# Patient Record
Sex: Male | Born: 1970 | Race: White | Hispanic: No | Marital: Married | Smoking: Former smoker
Health system: Southern US, Community
[De-identification: ages and names within clinical notes are randomized; demographics above are authoritative.]

## PROBLEM LIST (undated history)

## (undated) DIAGNOSIS — J449 Chronic obstructive pulmonary disease, unspecified: Secondary | ICD-10-CM

## (undated) DIAGNOSIS — B019 Varicella without complication: Secondary | ICD-10-CM

## (undated) DIAGNOSIS — E785 Hyperlipidemia, unspecified: Secondary | ICD-10-CM

## (undated) DIAGNOSIS — T7840XA Allergy, unspecified, initial encounter: Secondary | ICD-10-CM

## (undated) DIAGNOSIS — J45909 Unspecified asthma, uncomplicated: Secondary | ICD-10-CM

## (undated) DIAGNOSIS — G2581 Restless legs syndrome: Secondary | ICD-10-CM

## (undated) DIAGNOSIS — K219 Gastro-esophageal reflux disease without esophagitis: Secondary | ICD-10-CM

## (undated) DIAGNOSIS — Z720 Tobacco use: Secondary | ICD-10-CM

## (undated) DIAGNOSIS — IMO0001 Reserved for inherently not codable concepts without codable children: Secondary | ICD-10-CM

## (undated) DIAGNOSIS — S270XXA Traumatic pneumothorax, initial encounter: Secondary | ICD-10-CM

## (undated) HISTORY — PX: FINGER SURGERY: SHX640

## (undated) HISTORY — DX: Chronic obstructive pulmonary disease, unspecified: J44.9

## (undated) HISTORY — PX: HERNIA REPAIR: SHX51

## (undated) HISTORY — DX: Gastro-esophageal reflux disease without esophagitis: K21.9

## (undated) HISTORY — DX: Traumatic pneumothorax, initial encounter: S27.0XXA

## (undated) HISTORY — DX: Allergy, unspecified, initial encounter: T78.40XA

## (undated) HISTORY — DX: Varicella without complication: B01.9

## (undated) HISTORY — DX: Hyperlipidemia, unspecified: E78.5

## (undated) HISTORY — DX: Tobacco use: Z72.0

---

## 2003-05-12 ENCOUNTER — Other Ambulatory Visit: Payer: Self-pay

## 2006-09-16 ENCOUNTER — Emergency Department: Payer: Self-pay | Admitting: Emergency Medicine

## 2008-04-14 ENCOUNTER — Ambulatory Visit: Payer: Self-pay | Admitting: Surgery

## 2008-04-21 ENCOUNTER — Ambulatory Visit: Payer: Self-pay | Admitting: Surgery

## 2009-02-18 ENCOUNTER — Ambulatory Visit: Payer: Self-pay | Admitting: Family Medicine

## 2009-03-24 ENCOUNTER — Ambulatory Visit: Payer: Self-pay | Admitting: Internal Medicine

## 2009-07-17 ENCOUNTER — Emergency Department: Payer: Self-pay | Admitting: Emergency Medicine

## 2009-10-31 ENCOUNTER — Ambulatory Visit: Payer: Self-pay | Admitting: Internal Medicine

## 2010-01-10 ENCOUNTER — Emergency Department: Payer: Self-pay | Admitting: Emergency Medicine

## 2011-03-04 ENCOUNTER — Ambulatory Visit: Payer: Self-pay | Admitting: Internal Medicine

## 2011-05-09 ENCOUNTER — Ambulatory Visit: Payer: Self-pay | Admitting: Internal Medicine

## 2011-07-24 ENCOUNTER — Ambulatory Visit: Payer: Self-pay | Admitting: Internal Medicine

## 2013-03-24 ENCOUNTER — Ambulatory Visit: Payer: Self-pay

## 2013-05-08 ENCOUNTER — Ambulatory Visit: Payer: Self-pay | Admitting: Emergency Medicine

## 2013-08-19 ENCOUNTER — Ambulatory Visit: Payer: Self-pay | Admitting: Emergency Medicine

## 2013-08-19 ENCOUNTER — Ambulatory Visit: Payer: Self-pay | Admitting: Family Medicine

## 2013-08-31 DIAGNOSIS — M549 Dorsalgia, unspecified: Secondary | ICD-10-CM | POA: Insufficient documentation

## 2013-10-24 ENCOUNTER — Ambulatory Visit: Payer: Self-pay | Admitting: Rheumatology

## 2013-11-21 DIAGNOSIS — M5030 Other cervical disc degeneration, unspecified cervical region: Secondary | ICD-10-CM | POA: Insufficient documentation

## 2013-11-21 DIAGNOSIS — M4802 Spinal stenosis, cervical region: Secondary | ICD-10-CM | POA: Insufficient documentation

## 2013-11-25 ENCOUNTER — Encounter: Payer: Self-pay | Admitting: Physical Medicine and Rehabilitation

## 2013-12-06 ENCOUNTER — Encounter: Payer: Self-pay | Admitting: Physical Medicine and Rehabilitation

## 2014-01-06 ENCOUNTER — Encounter: Payer: Self-pay | Admitting: Physical Medicine and Rehabilitation

## 2014-02-06 ENCOUNTER — Encounter: Payer: Self-pay | Admitting: Physical Medicine and Rehabilitation

## 2014-02-10 ENCOUNTER — Ambulatory Visit: Payer: Self-pay | Admitting: Family Medicine

## 2014-04-09 ENCOUNTER — Ambulatory Visit
Admit: 2014-04-09 | Disposition: A | Discharge status: Home / Self Care | Payer: Self-pay | Attending: Internal Medicine | Admitting: Internal Medicine

## 2015-02-03 ENCOUNTER — Ambulatory Visit: Payer: 59

## 2015-02-03 ENCOUNTER — Ambulatory Visit (INDEPENDENT_AMBULATORY_CARE_PROVIDER_SITE_OTHER): Payer: 59

## 2015-02-03 ENCOUNTER — Ambulatory Visit
Admission: EM | Admit: 2015-02-03 | Discharge: 2015-02-03 | Disposition: A | Discharge status: Home / Self Care | Payer: 59 | Attending: Family Medicine | Admitting: Family Medicine

## 2015-02-03 DIAGNOSIS — R0781 Pleurodynia: Secondary | ICD-10-CM | POA: Diagnosis not present

## 2015-02-03 DIAGNOSIS — J438 Other emphysema: Secondary | ICD-10-CM

## 2015-02-03 DIAGNOSIS — R0789 Other chest pain: Secondary | ICD-10-CM

## 2015-02-03 DIAGNOSIS — R918 Other nonspecific abnormal finding of lung field: Secondary | ICD-10-CM | POA: Diagnosis not present

## 2015-02-03 HISTORY — DX: Unspecified asthma, uncomplicated: J45.909

## 2015-02-03 LAB — CBC WITH DIFFERENTIAL/PLATELET
BASOS ABS: 0 10*3/uL (ref 0–0.1)
Basophils Relative: 0 %
Eosinophils Absolute: 0.4 10*3/uL (ref 0–0.7)
Eosinophils Relative: 4 %
HEMATOCRIT: 47.8 % (ref 40.0–52.0)
Hemoglobin: 16.6 g/dL (ref 13.0–18.0)
Lymphocytes Relative: 31 %
Lymphs Abs: 2.9 10*3/uL (ref 1.0–3.6)
MCH: 31.3 pg (ref 26.0–34.0)
MCHC: 34.7 g/dL (ref 32.0–36.0)
MCV: 90.4 fL (ref 80.0–100.0)
MONO ABS: 0.8 10*3/uL (ref 0.2–1.0)
Monocytes Relative: 9 %
NEUTROS ABS: 5.3 10*3/uL (ref 1.4–6.5)
Neutrophils Relative %: 56 %
Platelets: 239 10*3/uL (ref 150–440)
RBC: 5.28 MIL/uL (ref 4.40–5.90)
RDW: 12.9 % (ref 11.5–14.5)
WBC: 9.4 10*3/uL (ref 3.8–10.6)

## 2015-02-03 LAB — COMPREHENSIVE METABOLIC PANEL
ALBUMIN: 4.4 g/dL (ref 3.5–5.0)
ALT: 29 U/L (ref 17–63)
AST: 20 U/L (ref 15–41)
Alkaline Phosphatase: 67 U/L (ref 38–126)
Anion gap: 9 (ref 5–15)
BILIRUBIN TOTAL: 0.8 mg/dL (ref 0.3–1.2)
BUN: 13 mg/dL (ref 6–20)
CHLORIDE: 105 mmol/L (ref 101–111)
CO2: 25 mmol/L (ref 22–32)
Calcium: 9.4 mg/dL (ref 8.9–10.3)
Creatinine, Ser: 0.93 mg/dL (ref 0.61–1.24)
GFR calc Af Amer: >60 mL/min (ref >60)
GFR calc non Af Amer: >60 mL/min (ref >60)
GLUCOSE: 111 mg/dL — AB (ref 65–99)
POTASSIUM: 3.7 mmol/L (ref 3.5–5.1)
Sodium: 139 mmol/L (ref 135–145)
TOTAL PROTEIN: 7.3 g/dL (ref 6.5–8.1)

## 2015-02-03 LAB — FIBRIN DERIVATIVES D-DIMER (ARMC ONLY): Fibrin derivatives D-dimer (ARMC): 272 (ref 0–499)

## 2015-02-03 LAB — CK: Total CK: 118 U/L (ref 49–397)

## 2015-02-03 LAB — TROPONIN I: Troponin I: <0.03 ng/mL (ref <0.031)

## 2015-02-03 LAB — CKMB (ARMC ONLY): CK, MB: 2 ng/mL (ref 0.5–5.0)

## 2015-02-03 MED ORDER — KETOROLAC TROMETHAMINE 60 MG/2ML IM SOLN
60.0000 mg | Freq: Once | INTRAMUSCULAR | Status: AC
  Administered 2015-02-03: 60 mg via INTRAMUSCULAR

## 2015-02-03 MED ORDER — MELOXICAM 15 MG PO TABS: 15.0000 mg | ORAL_TABLET | Freq: Every day | ORAL | Status: DC

## 2015-02-03 NOTE — ED Provider Notes (Signed)
CSN: [id number]     Arrival date & time 02/03/15  1101 History   First MD Initiated Contact with Patient 02/03/15 1126    Nurses notes were reviewed. Chief Complaint  Patient presents with  . Chest Pain    Pt with 2 weeks of pleuritic chest pain, constant, and radiates around to right back. 6/10 but much worse with movement, coughing, or deep breaths. Had URI in past two months. No SOB. No known injury.   Patient reports pain in his right side of his chest and ribs for 2-4 weeks. He states really occurred about 4 weeks ago 2 weeks his been almost persistent and the last 2 days is been an increase in testing today we took a deep breath was 8-9 out of 10 pain. Because of the recurrent pain and increased intensity came in to be seen and evaluated. 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  No history of coronary artery disease or heart disease however he does have a history of hypertension. He also has history of COPD and bronchospastic disease. He has a history smoking he's been smoking up to 2 packs a day he states he is down to 1 pack a day and is attempting to quit.  He has a brother has hypertension his mother has had myocardial infarctions and she's had some stents put in. He denies any fever chest congestion or coughing up any phlegm he does report being short of breath at times. Only surgeries a previous hernia surgery for the past.  (Consider location/radiation/quality/duration/timing/severity/associated sxs/prior Treatment) Patient is a 45 y.o. male presenting with chest pain. The history is provided by the patient and the spouse. No language interpreter was used.  Chest Pain Pain location:  R chest Pain quality: sharp, shooting and stabbing   Pain radiates to:  Does not radiate Onset quality:  Gradual Duration:  2 weeks Timing:  Constant Progression:  Worsening Context: breathing and movement   Relieved by:  Nothing Worsened by:  Nothing  tried Associated symptoms: cough and shortness of breath   Associated symptoms: no abdominal pain, no AICD problem, no altered mental status, no dizziness, no fever, no headache, no heartburn and no syncope   Risk factors: hypertension, male sex and smoking   Risk factors: no coronary artery disease, no diabetes mellitus, no prior DVT/PE and no surgery     Past Medical History  Diagnosis Date  . Asthma    Past Surgical History  Procedure Laterality Date  . Hernia repair     History reviewed. No pertinent family history. Social History  Substance Use Topics  . Smoking status: Current Every Day Smoker -- 1.00 packs/day    Types: Cigarettes  . Smokeless tobacco: None  . Alcohol Use: Yes     Comment: social    Review of Systems  Constitutional: Negative for fever.  Respiratory: Positive for cough and shortness of breath.   Cardiovascular: Positive for chest pain. Negative for syncope.  Gastrointestinal: Negative for heartburn and abdominal pain.  Neurological: Negative for dizziness and headaches.    Allergies  Review of patient's allergies indicates no known allergies.  Home Medications   Prior to Admission medications   Medication Sig Start Date End Date Taking? Authorizing Provider  albuterol (PROVENTIL HFA;VENTOLIN HFA) 108 (90 Base) MCG/ACT inhaler Inhale into the lungs every 6 (six) hours as needed for wheezing or shortness of breath.   Yes Historical Provider, MD  budesonide-formoterol (  SYMBICORT) 160-4.5 MCG/ACT inhaler Inhale 2 puffs into the lungs 2 (two) times daily.   Yes Historical Provider, MD  pantoprazole (PROTONIX) 40 MG tablet Take 40 mg by mouth 2 (two) times daily.   Yes Historical Provider, MD  meloxicam (MOBIC) 15 MG tablet Take 1 tablet (15 mg total) by mouth daily. 02/03/15   Hassan Rowan, MD   Meds Ordered and Administered this Visit   Medications  ketorolac (TORADOL) injection 60 mg (60 mg Intramuscular Given 02/03/15 1234)    BP 144/90 mmHg   Pulse 88  Temp(Src) 97.9 F (36.6 C) (Tympanic)  Resp 20  Ht  (1.753 m)  Wt 180 lb (81.647 kg)  BMI 26.57 kg/m2  SpO2 97% No data found.   Physical Exam  Constitutional: He is oriented to person, place, and time. He appears well-developed and well-nourished.  HENT:  Head: Normocephalic and atraumatic.  Right Ear: External ear normal.  Left Ear: External ear normal.  Mouth/Throat: Oropharynx is clear and moist.  Eyes: Conjunctivae are normal. Pupils are equal, round, and reactive to light.  Neck: Normal range of motion. Neck supple.  Cardiovascular: Normal rate, regular rhythm and normal heart sounds.   Pulmonary/Chest: Effort normal and breath sounds normal. No respiratory distress. He has no wheezes. Right breast exhibits no inverted nipple and no mass. Left breast exhibits no inverted nipple and no mass.    Procedure: Right chest wall tenderness  Abdominal: Soft. He exhibits distension.  Musculoskeletal: Normal range of motion. He exhibits tenderness.  Neurological: He is alert and oriented to person, place, and time.  Skin: Skin is warm. No rash noted. No erythema.  Psychiatric: He has a normal mood and affect.  Vitals reviewed.   ED Course  Procedures (including critical care time)  Labs Review Labs Reviewed  COMPREHENSIVE METABOLIC PANEL - Abnormal; Notable for the following:    Glucose, Bld 111 (*)    All other components within normal limits  CBC WITH DIFFERENTIAL/PLATELET  TROPONIN I  FIBRIN DERIVATIVES D-DIMER ([facility] ONLY)  CK  CKMB([facility] ONLY)    Imaging Review Dg Chest 2 View  02/03/2015  CLINICAL DATA:  Chest and right-sided rib pain for the past 2 weeks. No known trauma. EXAM: CHEST  2 VIEW COMPARISON:  03/24/2013; 07/24/2011; right rib radiographic series-earlier same day FINDINGS: Grossly unchanged cardiac silhouette and mediastinal contours. The lungs appear hyperexpanded. There is mild diffuse slightly nodular thickening of the pulmonary  interstitium. There is minimal pleural parenchymal thickening about the bilateral major fissures. There is an ill-defined punctate (approximately 0.9 cm) nodule which overlies the peripheral aspect of the right upper lung. No focal airspace opacities. No pleural effusion or pneumothorax. No evidence edema. No acute osseous abnormalities. IMPRESSION: 1. Hyperexpanded lungs and bronchitic change without acute cardiopulmonary disease. 2. Ill-defined punctate (approximately 0.9 cm) suspected right upper lung pulmonary nodule, not definitely seen on prior examinations. A follow-up chest radiograph in 4-6 weeks is recommended to ensure stability and/or resolution. Alternatively, further evaluation could be performed with contrast-enhanced chest CT as indicated. These results will be called to the ordering clinician or representative by the Radiologist Assistant, and communication documented in the PACS or zVision Dashboard. Electronically Signed   By: Simonne Come M.D.   On: 02/03/2015 12:04   Dg Ribs Unilateral W/chest Right  02/03/2015  CLINICAL DATA:  Chest and right-sided rib pain for 2 weeks. No history of trauma. EXAM: RIGHT RIBS AND CHEST - 3+ VIEW COMPARISON:  Chest x-ray dated 03/24/2013. FINDINGS: Oblique  views of the right ribs were obtained with a radiopaque BB localized region of patient concern. No evidence for rib fracture. Subtle nodularity is seen in the right mid lung. Linear opacity superimposed on the anterior right fifth rib is unchanged since 03/24/2013. IMPRESSION: 1. No evidence for right-sided rib fracture. 2. Subtle nodularity in the right mid lung. Consider CT chest without contrast to further evaluate. Electronically Signed   By: Kennith Center M.D.   On: 02/03/2015 12:07   Ct Chest Wo Contrast  02/03/2015  CLINICAL DATA:  Questionable pulmonary nodule seen on preceding chest and right rib radiographic series. EXAM: CT CHEST WITHOUT CONTRAST TECHNIQUE: Multidetector CT imaging of the chest  was performed following the standard protocol without IV contrast. COMPARISON:  Chest and right rib radiographic series - earlier same day FINDINGS: There is a punctate (approximately 0.7 cm) potentially partially centrally cavitary nodule within the right upper lobe (image 25, series 3) which correlates with the nodule seen on preceding chest and right rib radiographic series. There is an adjacent punctate (approximately 3 mm) noncalcified nodule also with the right upper lobe (image 24, series 3). There is a punctate (approximately 6 mm) nodule within the medial segment of the right middle lobe (image 50, series 3) as well as a punctate (approximately 2 mm) nodule within the anterior basilar segment of the right lower lobe adjacent to the right major fissure (image 51). No mediastinal, hilar or axillary lymphadenopathy on this noncontrast examination. Mild apical predominant centrilobular emphysematous change. There is minimal subsegmental atelectasis within the left lower lobe and caudal segment of the lingula. No focal airspace opacities. No pleural effusion or pneumothorax. The central pulmonary airways appear widely patent. Normal heart size. Coronary artery calcifications. No pericardial effusion. Normal caliber of the main pulmonary artery. Normal caliber of the thoracic aorta. Conventional configuration of the aortic arch. Limited visualization of the upper abdomen demonstrates a punctate (approximately 4 mm) granuloma within the right lobe of the liver (image 63, series 2). No acute or aggressive osseous abnormalities. Mild bilateral gynecomastia. Note is made of a punctate (approximately 1 cm hypo attenuating (11 Hounsfield unit) likely cystic structure within the subcutaneous tissues of the left chest favored to represent a sebaceous cyst (image 25, series 2). IMPRESSION: 1. Mild centrilobular emphysematous change without acute cardiopulmonary disease. 2. Scattered punctate pulmonary nodules including  dominant partially cavitary approximately 0.7 cm nodule within the right upper lobe which correlates with the nodule seen on preceding chest radiograph and right rib radiographic series. Given risk factors for bronchogenic carcinoma, follow-up chest CT at 6 - 12 months is recommended. This recommendation follows the consensus statement: Guidelines for Management of Small Pulmonary Nodules Detected on CT Scans: A Statement from the Fleischner Society as published in Radiology 2005;237:395-400. 3. Coronary artery calcifications. Electronically Signed   By: Simonne Come M.D.   On: 02/03/2015 13:51     Visual Acuity Review  Right Eye Distance:   Left Eye Distance:   Bilateral Distance:    Right Eye Near:   Left Eye Near:    Bilateral Near:         MDM   1. Chest wall pain   2. Other chest pain   3. Pulmonary nodules/lesions, multiple   4. Abnormal chest x-ray with multiple lung nodules   5. Other emphysema (HCC)    He was given information about the chest nodules and findings of emphysematous changes. After injection Toradol he had marked improvement of the chest wall pain.  We'll have him follow-up with his PCP M1 to 2 months and also to have a repeat CT scan of the chest the lungs in 2-3 months. Stressed importance to stop smoking at this time. ED ECG REPORT I, Daneil Beem H, the attending physician, personally viewed and interpreted this ECG.   Date: 02/03/2015  EKG Time: 11:17:38  Rate:89  Rhythm: there are no previous tracings available for comparison, normal sinus rhythm, left atrial enlargement with rightward axis and ROS are prime or QR pattern suggesting right ventricular conduction delay  Axis: 80  Intervals:Right ventricular conduction delay  ST&T Change: None   Sinus rhythm with left atrial enlargement and right ventricular conduction delay Note: This dictation was prepared with Dragon dictation along with smaller phrase technology. Any transcriptional errors that  result from this process are unintentional.  Hassan Rowan, MD 02/03/15 [phone]

## 2015-02-03 NOTE — Discharge Instructions (Signed)
Chest Wall Pain Chest wall pain is pain in or around the bones and muscles of your chest. Sometimes, an injury causes this pain. Sometimes, the cause may not be known. This pain may take several weeks or longer to get better. HOME CARE Pay attention to any changes in your symptoms. Take these actions to help with your pain:  Rest as told by your doctor.  Avoid activities that cause pain. Try not to use your chest, belly (abdominal), or side muscles to lift heavy things.  If directed, apply ice to the painful area:  Put ice in a plastic bag.  Place a towel between your skin and the bag.  Leave the ice on for 20 minutes, 2-3 times per day.  Take over-the-counter and prescription medicines only as told by your doctor.  Do not use tobacco products, including cigarettes, chewing tobacco, and e-cigarettes. If you need help quitting, ask your doctor.  Keep all follow-up visits as told by your doctor. This is important. GET HELP IF:  You have a fever.  Your chest pain gets worse.  You have new symptoms. GET HELP RIGHT AWAY IF:  You feel sick to your stomach (nauseous) or you throw up (vomit).  You feel sweaty or light-headed.  You have a cough with phlegm (sputum) or you cough up blood.  You are short of breath.   This information is not intended to replace advice given to you by your health care provider. Make sure you discuss any questions you have with your health care provider.   Document Released: 06/11/2007 Document Revised: 09/13/2014 Document Reviewed: 03/20/2014 Elsevier Interactive Patient Education 2016 [employer] Inc[employer]  Chronic Obstructive Pulmonary Disease Chronic obstructive pulmonary disease (COPD) is a common lung condition in which airflow from the lungs is limited. COPD is a general term that can be used to describe many different lung problems that limit airflow, including both chronic bronchitis and emphysema. If you have COPD, your lung function will probably  never return to normal, but there are measures you can take to improve lung function and make yourself feel better. CAUSES   Smoking (common).  Exposure to secondhand smoke.  Genetic problems.  Chronic inflammatory lung diseases or recurrent infections. SYMPTOMS  Shortness of breath, especially with physical activity.  Deep, persistent (chronic) cough with a large amount of thick mucus.  Wheezing.  Rapid breaths (tachypnea).  Gray or bluish discoloration (cyanosis) of the skin, especially in your fingers, toes, or lips.  Fatigue.  Weight loss.  Frequent infections or episodes when breathing symptoms become much worse (exacerbations).  Chest tightness. DIAGNOSIS Your health care provider will take a medical history and perform a physical examination to diagnose COPD. Additional tests for COPD may include:  Lung (pulmonary) function tests.  Chest X-ray.  CT scan.  Blood tests. TREATMENT  Treatment for COPD may include:  Inhaler and nebulizer medicines. These help manage the symptoms of COPD and make your breathing more comfortable.  Supplemental oxygen. Supplemental oxygen is only helpful if you have a low oxygen level in your blood.  Exercise and physical activity. These are beneficial for nearly all people with COPD.  Lung surgery or transplant.  Nutrition therapy to gain weight, if you are underweight.  Pulmonary rehabilitation. This may involve working with a team of health care providers and specialists, such as respiratory, occupational, and physical therapists. HOME CARE INSTRUCTIONS  Take all medicines (inhaled or pills) as directed by your health care provider.  Avoid over-the-counter medicines or cough syrups  that dry up your airway (such as antihistamines) and slow down the elimination of secretions unless instructed otherwise by your health care provider.  If you are a smoker, the most important thing that you can do is stop smoking. Continuing to  smoke will cause further lung damage and breathing trouble. Ask your health care provider for help with quitting smoking. He or she can direct you to community resources or hospitals that provide support.  Avoid exposure to irritants such as smoke, chemicals, and fumes that aggravate your breathing.  Use oxygen therapy and pulmonary rehabilitation if directed by your health care provider. If you require home oxygen therapy, ask your health care provider whether you should purchase a pulse oximeter to measure your oxygen level at home.  Avoid contact with individuals who have a contagious illness.  Avoid extreme temperature and humidity changes.  Eat healthy foods. Eating smaller, more frequent meals and resting before meals may help you maintain your strength.  Stay active, but balance activity with periods of rest. Exercise and physical activity will help you maintain your ability to do things you want to do.  Preventing infection and hospitalization is very important when you have COPD. Make sure to receive all the vaccines your health care provider recommends, especially the pneumococcal and influenza vaccines. Ask your health care provider whether you need a pneumonia vaccine.  Learn and use relaxation techniques to manage stress.  Learn and use controlled breathing techniques as directed by your health care provider. Controlled breathing techniques include:  Pursed lip breathing. Start by breathing in (inhaling) through your nose for 1 second. Then, purse your lips as if you were going to whistle and breathe out (exhale) through the pursed lips for 2 seconds.  Diaphragmatic breathing. Start by putting one hand on your abdomen just above your waist. Inhale slowly through your nose. The hand on your abdomen should move out. Then purse your lips and exhale slowly. You should be able to feel the hand on your abdomen moving in as you exhale.  Learn and use controlled coughing to clear mucus  from your lungs. Controlled coughing is a series of short, progressive coughs. The steps of controlled coughing are:  Lean your head slightly forward.  Breathe in deeply using diaphragmatic breathing.  Try to hold your breath for 3 seconds.  Keep your mouth slightly open while coughing twice.  Spit any mucus out into a tissue.  Rest and repeat the steps once or twice as needed. SEEK MEDICAL CARE IF:  You are coughing up more mucus than usual.  There is a change in the color or thickness of your mucus.  Your breathing is more labored than usual.  Your breathing is faster than usual. SEEK IMMEDIATE MEDICAL CARE IF:  You have shortness of breath while you are resting.  You have shortness of breath that prevents you from:  Being able to talk.  Performing your usual physical activities.  You have chest pain lasting longer than 5 minutes.  Your skin color is more cyanotic than usual.  You measure low oxygen saturations for longer than 5 minutes with a pulse oximeter. MAKE SURE YOU:  Understand these instructions.  Will watch your condition.  Will get help right away if you are not doing well or get worse.   This information is not intended to replace advice given to you by your health care provider. Make sure you discuss any questions you have with your health care provider.   Document Released:  10/02/2004 Document Revised: 01/13/2014 Document Reviewed: 08/19/2012 Elsevier Interactive Patient Education 2016 [employer] Inc[employer]  Chronic Obstructive Pulmonary Disease Chronic obstructive pulmonary disease (COPD) is a common lung problem. In COPD, the flow of air from the lungs is limited. The way your lungs work will probably never return to normal, but there are things you can do to improve your lungs and make yourself feel better. Your doctor may treat your condition with:  Medicines.  Oxygen.  Lung surgery.  Changes to your diet.  Rehabilitation. This may involve a  team of specialists. HOME CARE  Take all medicines as told by your doctor.  Avoid medicines or cough syrups that dry up your airway (such as antihistamines) and do not allow you to get rid of thick spit. You do not need to avoid them if told differently by your doctor.  If you smoke, stop. Smoking makes the problem worse.  Avoid being around things that make your breathing worse (like smoke, chemicals, and fumes).  Use oxygen therapy and therapy to help improve your lungs (pulmonary rehabilitation) if told by your doctor. If you need home oxygen therapy, ask your doctor if you should buy a tool to measure your oxygen level (oximeter).  Avoid people who have a sickness you can catch (contagious).  Avoid going outside when it is very hot, cold, or humid.  Eat healthy foods. Eat smaller meals more often. Rest before meals.  Stay active, but remember to also rest.  Make sure to get all the shots (vaccines) your doctor recommends. Ask your doctor if you need a pneumonia shot.  Learn and use tips on how to relax.  Learn and use tips on how to control your breathing as told by your doctor. Try:  Breathing in (inhaling) through your nose for 1 second. Then, pucker your lips and breath out (exhale) through your lips for 2 seconds.  Putting one hand on your belly (abdomen). Breathe in slowly through your nose for 1 second. Your hand on your belly should move out. Pucker your lips and breathe out slowly through your lips. Your hand on your belly should move in as you breathe out.  Learn and use controlled coughing to clear thick spit from your lungs. The steps are:  Lean your head a little forward.  Breathe in deeply.  Try to hold your breath for 3 seconds.  Keep your mouth slightly open while coughing 2 times.  Spit any thick spit out into a tissue.  Rest and do the steps again 1 or 2 times as needed. GET HELP IF:  You cough up more thick spit than usual.  There is a change in  the color or thickness of the spit.  It is harder to breathe than usual.  Your breathing is faster than usual. GET HELP RIGHT AWAY IF:  You have shortness of breath while resting.  You have shortness of breath that stops you from:  Being able to talk.  Doing normal activities.  You chest hurts for longer than 5 minutes.  Your skin color is more blue than usual.  Your pulse oximeter shows that you have low oxygen for longer than 5 minutes. MAKE SURE YOU:  Understand these instructions.  Will watch your condition.  Will get help right away if you are not doing well or get worse.   This information is not intended to replace advice given to you by your health care provider. Make sure you discuss any questions you have with your health  care provider.   Document Released: 06/11/2007 Document Revised: 01/13/2014 Document Reviewed: 08/19/2012 Elsevier Interactive Patient Education 2016 Else[employer] Inc.  Nonspecific Chest Pain It is often hard to find the cause of chest pain. There is always a chance that your pain could be related to something serious, such as a heart attack or a blood clot in your lungs. Chest pain can also be caused by conditions that are not life-threatening. If you have chest pain, it is very important to follow up with your doctor.  HOME CARE  If you were prescribed an antibiotic medicine, finish it all even if you start to feel better.  Avoid any activities that cause chest pain.  Do not use any tobacco products, including cigarettes, chewing tobacco, or electronic cigarettes. If you need help quitting, ask your doctor.  Do not drink alcohol.  Take medicines only as told by your doctor.  Keep all follow-up visits as told by your doctor. This is important. This includes any further testing if your chest pain does not go away.  Your doctor may tell you to keep your head raised (elevated) while you sleep.  Make lifestyle changes as told by your doctor.  These may include:  Getting regular exercise. Ask your doctor to suggest some activities that are safe for you.  Eating a heart-healthy diet. Your doctor or a diet specialist (dietitian) can help you to learn healthy eating options.  Maintaining a healthy weight.  Managing diabetes, if necessary.  Reducing stress. GET HELP IF:  Your chest pain does not go away, even after treatment.  You have a rash with blisters on your chest.  You have a fever. GET HELP RIGHT AWAY IF:  Your chest pain is worse.  You have an increasing cough, or you cough up blood.  You have severe belly (abdominal) pain.  You feel extremely weak.  You pass out (faint).  You have chills.  You have sudden, unexplained chest discomfort.  You have sudden, unexplained discomfort in your arms, back, neck, or jaw.  You have shortness of breath at any time.  You suddenly start to sweat, or your skin gets clammy.  You feel nauseous.  You vomit.  You suddenly feel light-headed or dizzy.  Your heart begins to beat quickly, or it feels like it is skipping beats. These symptoms may be an emergency. Do not wait to see if the symptoms will go away. Get medical help right away. Call your local emergency services (911 in the [place] Do not drive yourself to the hospital.   This information is not intended to replace advice given to you by your health care provider. Make sure you discuss any questions you have with your health care provider.   Document Released: 06/11/2007 Document Revised: 01/13/2014 Document Reviewed: 07/29/2013 Elsevier Interactive Patient Education 2016 Else[employer] Inc.  Pulmonary Nodule  A pulmonary nodule is a small, round spot in your lung. It is usually found when pictures of your lungs are taken for other reasons. Most pulmonary nodules are not cancerous and do not cause symptoms. Tests will be done to make sure the nodule is not cancerous. Pulmonary nodules that are not cancerous usually  do not require treatment. HOME CARE   Only take medicine as told by your doctor.  Follow up with your doctor as told. GET HELP IF:  You have trouble breathing when doing activities.  You feel sick or more tired than normal.  You do not feel like eating.  You lose weight without  trying to.  You have chills.  You have night sweats. GET HELP RIGHT AWAY IF:  You cannot catch your breath.  You start making whistling sounds when breathing (wheezing).  You have a cough that does not go away.  You cough up blood.  You are dizzy or feel like you are going to pass out.  You have sudden chest pain.  You have a fever or lasting symptoms for more than 2-3 days.  You have a fever and your symptoms suddenly get worse. MAKE SURE YOU:  Understand these instructions.  Will watch your condition.  Will get help right away if you are not doing well or get worse.   This information is not intended to replace advice given to you by your health care provider. Make sure you discuss any questions you have with your health care provider.   Document Released: 01/25/2010 Document Revised: 05/09/2014 Document Reviewed: 06/14/2012 Elsevier Interactive Patient Education [employer]

## 2015-02-08 ENCOUNTER — Ambulatory Visit: Payer: Self-pay | Admitting: Family Medicine

## 2015-02-19 ENCOUNTER — Encounter: Payer: Self-pay | Admitting: Family Medicine

## 2015-02-19 ENCOUNTER — Ambulatory Visit (INDEPENDENT_AMBULATORY_CARE_PROVIDER_SITE_OTHER): Payer: 59 | Admitting: Family Medicine

## 2015-02-19 VITALS — BP 122/76 | HR 76 | Temp 98.3°F | Ht 67.0 in | Wt 182.4 lb

## 2015-02-19 DIAGNOSIS — K769 Liver disease, unspecified: Secondary | ICD-10-CM

## 2015-02-19 DIAGNOSIS — R918 Other nonspecific abnormal finding of lung field: Secondary | ICD-10-CM

## 2015-02-19 DIAGNOSIS — Z720 Tobacco use: Secondary | ICD-10-CM

## 2015-02-19 DIAGNOSIS — J449 Chronic obstructive pulmonary disease, unspecified: Secondary | ICD-10-CM

## 2015-02-19 DIAGNOSIS — K7689 Other specified diseases of liver: Secondary | ICD-10-CM

## 2015-02-19 DIAGNOSIS — Z13220 Encounter for screening for lipoid disorders: Secondary | ICD-10-CM

## 2015-02-19 DIAGNOSIS — N400 Enlarged prostate without lower urinary tract symptoms: Secondary | ICD-10-CM | POA: Diagnosis not present

## 2015-02-19 HISTORY — DX: Tobacco use: Z72.0

## 2015-02-19 MED ORDER — TAMSULOSIN HCL 0.4 MG PO CAPS: 0.4000 mg | ORAL_CAPSULE | Freq: Every day | ORAL | Status: DC

## 2015-02-19 MED ORDER — BUPROPION HCL 75 MG PO TABS: ORAL_TABLET | ORAL | Status: DC

## 2015-02-19 NOTE — Assessment & Plan Note (Signed)
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 Discussed options for further workup, though patient wanted to hold off on this and focused on workup of his lung nodules. We will refocus on this next visit.

## 2015-02-19 NOTE — Progress Notes (Signed)
Pre visit review using our clinic review tool, if applicable. No additional management support is needed unless otherwise documented below in the visit note. 

## 2015-02-19 NOTE — Assessment & Plan Note (Signed)
Quit 2 weeks ago. Patient like assistance and wants to use Wellbutrin. We'll start on Wellbutrin 150 mg daily for 3 days and then increase to 150 mg twice a day.

## 2015-02-19 NOTE — Assessment & Plan Note (Signed)
Multiple nodules noted in a patient with a history of smoking. Discussed options to repeat CT scan in 6 months versus being evaluated by pulmonology. Patient opted for pulmonology evaluation. Referral was placed.

## 2015-02-19 NOTE — Assessment & Plan Note (Addendum)
Patient with history of COPD on Symbicort. Using albuterol inhaler 4 times a day with good benefit. He does report some shortness of breath with this. Had significant workup for chest pain and shortness of breath several weeks ago with negative troponin, d-dimer, and EKG. Suspect some of this is related to COPD versus smoking history. I discussed adding additional inhaler such as Spiriva, though the patient opted against this and opted to wait until he saw the pulmonologist further recommendations. He will continue current medications. He is given return precautions.

## 2015-02-19 NOTE — Patient Instructions (Signed)
Nice to meet you. We will refer you to pulmonology for your lung nodules and COPD. We will start her on Wellbutrin for smoking cessation. Please start the Flomax for your urinary issues. If you develop lightheadedness, shortness of breath, chest pain, cough productive of blood, or any new or changing symptoms please seek medical attention.

## 2015-02-19 NOTE — Progress Notes (Signed)
Patient ID: Jimmy Howell, male   DOB: Apr 01, 1970, 45 y.o.   MRN: [id number]  Marikay Alar, MD Phone: [phone]  Jimmy Howell is a 45 y.o. male who presents today for new patient visit.  Lung nodules: Patient reports he was seen at urgent care 2 weeks ago for right-sided chest discomfort and was found to have multiple pulmonary nodules. Notes the pain has resolved with use of meloxicam. He had negative cardiac enzymes, PE workup, and EKG. Other lab work was unremarkable. Notes continued mild cough. Notes his breathing is unchanged and at his baseline from the last several years. He has asthma and COPD. He's been using his albuterol inhaler 4 times a day with good benefit. Takes Symbicort twice a day. Notes he quit smoking 2 weeks ago. Last PFTs were 4-5 years ago. No prior visits with pulmonology. He would like assistance in quitting smoking. He recently tried Chantix though had significant nausea with this.  Patient additionally was found to have a granuloma in his liver on the CT scan. He denies abdominal pain. CMP was normal for liver function.  Frequent urination: Patient notes in the past he's been advised that his prostate was mildly swollen. He urinates 4 times per night. 4-5 times per day. Has issues with starting and stopping with his stream. No dysuria. Minimal urgency. He has never been on Flomax.  Active Ambulatory Problems    Diagnosis Date Noted  . Lung nodules 02/19/2015  . COPD (chronic obstructive pulmonary disease) (HCC) 02/19/2015  . BPH (benign prostatic hyperplasia) 02/19/2015  . Liver lesion 02/19/2015  . Tobacco abuse 02/19/2015   Resolved Ambulatory Problems    Diagnosis Date Noted  . No Resolved Ambulatory Problems   Past Medical History  Diagnosis Date  . Asthma   . Chickenpox   . GERD (gastroesophageal reflux disease)     Family History  Problem Relation Age of Onset  . Arthritis      Parent  . Prostate cancer Father   . Hyperlipidemia        Parent  . Heart disease      Parent  . Hypertension      Parent  . Kidney disease      Parent    Social History   Social History  . Marital Status: Married    Spouse Name: N/A  . Number of Children: N/A  . Years of Education: N/A   Occupational History  . Not on file.   Social History Main Topics  . Smoking status: Former Smoker -- 1.00 packs/day    Types: Cigarettes    Quit date: 02/09/2015  . Smokeless tobacco: Not on file  . Alcohol Use: 0.0 oz/week    0 Standard drinks or equivalent per week     Comment: social  . Drug Use: No  . Sexual Activity: Not on file   Other Topics Concern  . Not on file   Social History Narrative    ROS   General:  Negative for nexplained weight loss, fever Skin: Negative for new or changing mole, sore that won't heal HEENT: Negative for trouble hearing, trouble seeing, ringing in ears, mouth sores, hoarseness, change in voice, dysphagia. CV:  Negative for chest pain, edema, palpitations Resp: Positive for cough, dyspnea, Negative for hemoptysis GI: Negative for nausea, vomiting, diarrhea, constipation, abdominal pain, melena, hematochezia. GU: Positive for frequent urination, Negative for dysuria, incontinence, urinary hesitance, hematuria, vaginal or penile discharge, polyuria, sexual difficulty, lumps in testicle or breasts MSK: Negative  for muscle cramps or aches, joint pain or swelling Neuro: Negative for headaches, weakness, numbness, dizziness, passing out/fainting Psych: Negative for depression, anxiety, memory problems  Objective  Physical Exam Filed Vitals:   02/19/15 1511  BP: 122/76  Pulse: 76  Temp: 98.3 F (36.8 C)    BP Readings from Last 3 Encounters:  02/19/15 122/76  02/03/15 144/90   Wt Readings from Last 3 Encounters:  02/19/15 182 lb 6.4 oz (82.736 kg)  02/03/15 180 lb (81.647 kg)    Physical Exam  Constitutional: He is well-developed, well-nourished, and in no distress.  HENT:  Head:  Normocephalic and atraumatic.  Right Ear: External ear normal.  Left Ear: External ear normal.  Mouth/Throat: Oropharynx is clear and moist. No oropharyngeal exudate.  Eyes: Conjunctivae are normal. Pupils are equal, round, and reactive to light.  Neck: Neck supple.  Cardiovascular: Normal rate and regular rhythm.  Exam reveals no gallop and no friction rub.   No murmur heard. Pulmonary/Chest: Effort normal and breath sounds normal. No respiratory distress. He has no wheezes. He has no rales.  Abdominal: Soft. Bowel sounds are normal. He exhibits no distension. There is no tenderness. There is no rebound and no guarding.  Genitourinary:  Normal penis, normal testicles, normal scrotum, minimally enlarged prostate with no nodules or tenderness, normal rectum  Musculoskeletal: He exhibits no edema.  Lymphadenopathy:    He has no cervical adenopathy.  Neurological: He is alert. Gait normal.  Skin: Skin is warm and dry. He is not diaphoretic.  Psychiatric: Mood and affect normal.     Assessment/Plan:   Lung nodules Multiple nodules noted in a patient with a history of smoking. Discussed options to repeat CT scan in 6 months versus being evaluated by pulmonology. Patient opted for pulmonology evaluation. Referral was placed.  COPD (chronic obstructive pulmonary disease) (HCC) Patient with history of COPD on Symbicort. Using albuterol inhaler 4 times a day with good benefit. He does report some shortness of breath with this. Had significant workup for chest pain and shortness of breath several weeks ago with negative troponin, d-dimer, and EKG. Suspect some of this is related to COPD versus smoking history. I discussed adding additional inhaler such as Spiriva, though the patient opted against this and opted to wait until he saw the pulmonologist further recommendations. He will continue current medications. He is given return precautions.  BPH (benign prostatic hyperplasia) Urinary symptoms  consistent with BPH. IPSs score of 22 with mostly dissatisfied. Discussed starting Flomax versus seeing urology. Patient opted for Flomax. Discussed side effect of lightheadedness and to be wary of this. Flomax sent to pharmacy.  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 Discussed options for further workup, though patient wanted to hold off on this and focused on workup of his lung nodules. We will refocus on this next visit.  Tobacco abuse Quit 2 weeks ago. Patient like assistance and wants to use Wellbutrin. We'll start on Wellbutrin 150 mg daily for 3 days and then increase to 150 mg twice a day.    Orders Placed This Encounter  Procedures  . PSA    Standing Status: Future     Number of Occurrences:      Standing Expiration Date: 02/19/2016  . Lipid Profile    Standing Status: Future     Number of Occurrences:      Standing Expiration Date: 02/19/2016  . Ambulatory referral to Pulmonology    Referral Priority:  Routine  Referral Type:  Consultation    Referral Reason:  Specialty Services Required    Requested Specialty:  Pulmonary Disease    Number of Visits Requested:  1    Meds ordered this encounter  Medications  . buPROPion (WELLBUTRIN) 75 MG tablet    Sig: Please take 150 mg (2 tablets) daily for 3 days, then increase to 150 mg (2 tablets) twice daily.    Dispense:  120 tablet    Refill:  1  . tamsulosin (FLOMAX) 0.4 MG CAPS capsule    Sig: Take 1 capsule (0.4 mg total) by mouth daily.    Dispense:  30 capsule    Refill:  3     Marikay Alar

## 2015-02-19 NOTE — Assessment & Plan Note (Signed)
Urinary symptoms consistent with BPH. IPSs score of 22 with mostly dissatisfied. Discussed starting Flomax versus seeing urology. Patient opted for Flomax. Discussed side effect of lightheadedness and to be wary of this. Flomax sent to pharmacy.

## 2015-03-09 ENCOUNTER — Other Ambulatory Visit: Payer: 59

## 2015-03-14 ENCOUNTER — Telehealth: Payer: Self-pay | Admitting: *Deleted

## 2015-03-14 DIAGNOSIS — R918 Other nonspecific abnormal finding of lung field: Secondary | ICD-10-CM

## 2015-03-14 NOTE — Telephone Encounter (Signed)
Per KK, order for CT chest w/o contrast.

## 2015-03-15 ENCOUNTER — Other Ambulatory Visit (INDEPENDENT_AMBULATORY_CARE_PROVIDER_SITE_OTHER): Payer: 59

## 2015-03-15 ENCOUNTER — Encounter: Payer: Self-pay | Admitting: Family Medicine

## 2015-03-15 DIAGNOSIS — N400 Enlarged prostate without lower urinary tract symptoms: Secondary | ICD-10-CM

## 2015-03-15 DIAGNOSIS — Z13220 Encounter for screening for lipoid disorders: Secondary | ICD-10-CM

## 2015-03-15 LAB — LIPID PANEL
Cholesterol: 183 mg/dL (ref 0–200)
HDL: 52.1 mg/dL (ref >39.00)
LDL Cholesterol: 105 mg/dL — ABNORMAL HIGH (ref 0–99)
NonHDL: 131.16
TRIGLYCERIDES: 133 mg/dL (ref 0.0–149.0)
Total CHOL/HDL Ratio: 4
VLDL: 26.6 mg/dL (ref 0.0–40.0)

## 2015-03-15 LAB — PSA: PSA: 0.71 ng/mL (ref 0.10–4.00)

## 2015-03-22 ENCOUNTER — Encounter: Payer: Self-pay | Admitting: Family Medicine

## 2015-03-22 ENCOUNTER — Ambulatory Visit (INDEPENDENT_AMBULATORY_CARE_PROVIDER_SITE_OTHER): Payer: 59 | Admitting: Family Medicine

## 2015-03-22 VITALS — BP 130/86 | HR 85 | Temp 98.3°F | Ht 67.0 in | Wt 185.6 lb

## 2015-03-22 DIAGNOSIS — J449 Chronic obstructive pulmonary disease, unspecified: Secondary | ICD-10-CM | POA: Diagnosis not present

## 2015-03-22 DIAGNOSIS — M545 Low back pain, unspecified: Secondary | ICD-10-CM | POA: Insufficient documentation

## 2015-03-22 DIAGNOSIS — N400 Enlarged prostate without lower urinary tract symptoms: Secondary | ICD-10-CM

## 2015-03-22 DIAGNOSIS — R918 Other nonspecific abnormal finding of lung field: Secondary | ICD-10-CM

## 2015-03-22 MED ORDER — MELOXICAM 15 MG PO TABS: 15.0000 mg | ORAL_TABLET | Freq: Every day | ORAL | Status: DC

## 2015-03-22 NOTE — Assessment & Plan Note (Signed)
Patient with chronic intermittent low back pain. Much improved since starting on meloxicam. No red flags. Neurologically intact in his lower extremities. He will continue meloxicam. Refill sent to the pharmacy. Discussed taking this with food. If he develops upset stomach he will let us know. Given return precautions.

## 2015-03-22 NOTE — Assessment & Plan Note (Signed)
Reports symptoms improved. Still some symptoms. Discussed that starting Flomax may be beneficial to help reduce his symptoms even further. Advised on lightheadedness with Flomax. If this develops he is to rise slowly. If it persists he will stop the medicine and let us know. He will decide if he wants to start on this medication.

## 2015-03-22 NOTE — Progress Notes (Signed)
Patient ID: Jimmy Howell, male   DOB: 11/12/1970, 45 y.o.   MRN: [id number]  Marikay Alar, MD Phone: [phone]  Jimmy Howell is a 45 y.o. male who presents today for follow-up.  Patient reports he has an appointment scheduled with pulmonology for follow-up of his lung nodules. He is scheduled for a repeat CT scan on 04/03/15 for this. He notes no cough. He is breathing well. No chest pain. He is using his albuterol inhaler 3-4 times a day and taking his Symbicort twice daily. He continues to smoke 1 pack per day and has not started the Wellbutrin yet.  BPH: Patient notes his urinary symptoms are not as bad. Nocturia 2. He goes to the bathroom 4-5 times while at work for 8 hours. Has not started the Flomax yet as he feels his symptoms have improved.  Low back pain: Patient notes long history of back issues. Notes the meloxicam that he was on from the urgent care has significantly with this. Has minimal back pain on this. Notes it is a low back aching discomfort. No numbness or weakness. No bowel or bladder incontinence, saddle anesthesia, fevers, or known history of cancer.  PMH: Current smoker ROS see history of present illness  Objective  Physical Exam Filed Vitals:   03/22/15 1430  BP: 130/86  Pulse: 85  Temp: 98.3 F (36.8 C)    BP Readings from Last 3 Encounters:  03/22/15 130/86  02/19/15 122/76  02/03/15 144/90   Wt Readings from Last 3 Encounters:  03/22/15 185 lb 9.6 oz (84.188 kg)  02/19/15 182 lb 6.4 oz (82.736 kg)  02/03/15 180 lb (81.647 kg)    Physical Exam  Constitutional: He is well-developed, well-nourished, and in no distress.  HENT:  Head: Normocephalic and atraumatic.  Right Ear: External ear normal.  Left Ear: External ear normal.  Mouth/Throat: Oropharynx is clear and moist. No oropharyngeal exudate.  Eyes: Conjunctivae are normal. Pupils are equal, round, and reactive to light.  Cardiovascular: Normal rate, regular rhythm and normal  heart sounds.  Exam reveals no gallop and no friction rub.   No murmur heard. Pulmonary/Chest: Effort normal and breath sounds normal. No respiratory distress. He has no wheezes. He has no rales.  Musculoskeletal:  No midline spine tenderness, no midline spine step-off, no muscular back tenderness  Neurological: He is alert.  5 out of 5 strength bilateral quads, hamstrings, plantar flexion, dorsiflexion, sensation to light touch intact in bilateral lower extremities, 2+ patellar reflexes  Skin: Skin is warm and dry. He is not diaphoretic.     Assessment/Plan: Please see individual problem list.  BPH (benign prostatic hyperplasia) Reports symptoms improved. Still some symptoms. Discussed that starting Flomax may be beneficial to help reduce his symptoms even further. Advised on lightheadedness with Flomax. If this develops he is to rise slowly. If it persists he will stop the medicine and let us know. He will decide if he wants to start on this medication.  COPD (chronic obstructive pulmonary disease) (HCC) Stable. Continue Symbicort and albuterol. Benign lung exam. Vital signs stable. Continues to smoke. I advised to quit smoking. Can start Wellbutrin if he desires as we discussed previously. He will follow-up with pulmonology. Even return precautions.  Lung nodules Repeat CT scan scheduled. pulmonology follow-up scheduled  Low back pain Patient with chronic intermittent low back pain. Much improved since starting on meloxicam. No red flags. Neurologically intact in his lower extremities. He will continue meloxicam. Refill sent to the pharmacy. Discussed taking  this with food. If he develops upset stomach he will let us know. Given return precautions.    No orders of the defined types were placed in this encounter.    Meds ordered this encounter  Medications  . meloxicam (MOBIC) 15 MG tablet    Sig: Take 1 tablet (15 mg total) by mouth daily.    Dispense:  30 tablet    Refill:  1       Marikay AlarEric Abeer Deskins, MD [facility]

## 2015-03-22 NOTE — Assessment & Plan Note (Signed)
Repeat CT scan scheduled. pulmonology follow-up scheduled

## 2015-03-22 NOTE — Assessment & Plan Note (Signed)
Stable. Continue Symbicort and albuterol. Benign lung exam. Vital signs stable. Continues to smoke. I advised to quit smoking. Can start Wellbutrin if he desires as we discussed previously. He will follow-up with pulmonology. Even return precautions.

## 2015-03-22 NOTE — Progress Notes (Signed)
Pre visit review using our clinic review tool, if applicable. No additional management support is needed unless otherwise documented below in the visit note. 

## 2015-03-22 NOTE — Patient Instructions (Signed)
Nice to see you. Please keep follow-up with pulmonologist and obtain the CT scan of your chest. You can start the Flomax if desired to help with your BPH. Please monitor your back pain and continue take meloxicam as needed. If you develop numbness, weakness, loss of bowel or bladder function, numbness between her legs, fevers, chest pain, shortness of breath, cough productive of blood, or any new or changing symptoms please seek medical attention.

## 2015-04-03 ENCOUNTER — Ambulatory Visit
Admission: RE | Admit: 2015-04-03 | Discharge: 2015-04-03 | Disposition: A | Discharge status: Home / Self Care | Payer: 59 | Source: Ambulatory Visit | Attending: Internal Medicine | Admitting: Internal Medicine

## 2015-04-03 DIAGNOSIS — R918 Other nonspecific abnormal finding of lung field: Secondary | ICD-10-CM | POA: Diagnosis not present

## 2015-04-06 ENCOUNTER — Other Ambulatory Visit: Payer: Self-pay | Admitting: Internal Medicine

## 2015-04-06 ENCOUNTER — Ambulatory Visit (INDEPENDENT_AMBULATORY_CARE_PROVIDER_SITE_OTHER): Payer: 59 | Admitting: Internal Medicine

## 2015-04-06 ENCOUNTER — Encounter: Payer: Self-pay | Admitting: Internal Medicine

## 2015-04-06 ENCOUNTER — Encounter (INDEPENDENT_AMBULATORY_CARE_PROVIDER_SITE_OTHER): Payer: Self-pay

## 2015-04-06 VITALS — BP 128/88 | HR 85

## 2015-04-06 DIAGNOSIS — R918 Other nonspecific abnormal finding of lung field: Secondary | ICD-10-CM

## 2015-04-06 MED ORDER — UMECLIDINIUM BROMIDE 62.5 MCG/INH IN AEPB: 1.0000 | INHALATION_SPRAY | Freq: Every day | RESPIRATORY_TRACT | Status: AC

## 2015-04-06 NOTE — Progress Notes (Signed)
[facility] Pulmonary Medicine Consultation      Date: 04/06/2015,   MRN# [id number] Jimmy Howell [birth year] Code Status:  Code Status History    This patient does not have a recorded code status. Please follow your organizational policy for patients in this situation.     Hosp day:@LENGTHOFSTAYDAYS @ Referring MD: @     PCP:      Admission                  Current  Jimmy Howell is a 45 y.o. old male seen in consultation for COPD and lung nodules at the request of Dr. Birdie Sons     CHIEF COMPLAINT:   Wheezing and SOB   HISTORY OF PRESENT ILLNESS   45 yo white male seen today for several issues. The first problem is that patient has been dx with COPD several years ago and was dx with COPD and was given symbicort and ventolin He has has intermittent wheezing which is constant and worsens with exertion and has been using ventolin daily, associated with DOE and SOB-worsening over last several months.   Patient has extensive smoking history approx 2 PPD for 30 years, he continues to smoke despite wheezing He has no fevers, chills. No signs of infection  The second problem is that patient has a RUL lung nodule that has grown over the past several months The first CT chest images on 02/03/15 reviewed 04/06/2015 showed multiple b/l nodules the largest in RUL measuring 7 MM The Repeat CT chest images on 04/03/15 images reviewed 04/06/2015 shows RUL nodule to have increased in size to 9 MM  Patient has had 3 pound weight loss     PAST MEDICAL HISTORY   Past Medical History  Diagnosis Date  . Asthma   . COPD (chronic obstructive pulmonary disease) (HCC)   . Chickenpox   . GERD (gastroesophageal reflux disease)      SURGICAL HISTORY   Past Surgical History  Procedure Laterality Date  . Hernia repair    . Finger surgery       FAMILY HISTORY   Family History  Problem Relation Age of Onset  . Arthritis      Parent  . Prostate cancer Father   .  Hyperlipidemia      Parent  . Heart disease      Parent  . Hypertension      Parent  . Kidney disease      Parent     SOCIAL HISTORY   Social History  Substance Use Topics  . Smoking status: Current Every Day Smoker -- 1.00 packs/day    Types: Cigarettes    Last Attempt to Quit: 02/09/2015  . Smokeless tobacco: None  . Alcohol Use: 0.0 oz/week    0 Standard drinks or equivalent per week     Comment: social     MEDICATIONS    Home Medication:  Current Outpatient Rx  Name  Route  Sig  Dispense  Refill  . albuterol (PROVENTIL HFA;VENTOLIN HFA) 108 (90 Base) MCG/ACT inhaler   Inhalation   Inhale into the lungs every 6 (six) hours as needed for wheezing or shortness of breath.         . budesonide-formoterol (SYMBICORT) 160-4.5 MCG/ACT inhaler   Inhalation   Inhale 2 puffs into the lungs 2 (two) times daily.         Marland Kitchen buPROPion (WELLBUTRIN) 75 MG tablet      Please take 150 mg (2 tablets) daily for  3 days, then increase to 150 mg (2 tablets) twice daily.   120 tablet   1   . meloxicam (MOBIC) 15 MG tablet   Oral   Take 1 tablet (15 mg total) by mouth daily.   30 tablet   1   . pantoprazole (PROTONIX) 40 MG tablet   Oral   Take 40 mg by mouth 2 (two) times daily.         . tamsulosin (FLOMAX) 0.4 MG CAPS capsule   Oral   Take 1 capsule (0.4 mg total) by mouth daily.   30 capsule   3     Current Medication:  Current outpatient prescriptions:  .  albuterol (PROVENTIL HFA;VENTOLIN HFA) 108 (90 Base) MCG/ACT inhaler, Inhale into the lungs every 6 (six) hours as needed for wheezing or shortness of breath., Disp: , Rfl:  .  budesonide-formoterol (SYMBICORT) 160-4.5 MCG/ACT inhaler, Inhale 2 puffs into the lungs 2 (two) times daily., Disp: , Rfl:  .  buPROPion (WELLBUTRIN) 75 MG tablet, Please take 150 mg (2 tablets) daily for 3 days, then increase to 150 mg (2 tablets) twice daily., Disp: 120 tablet, Rfl: 1 .  meloxicam (MOBIC) 15 MG tablet, Take 1 tablet  (15 mg total) by mouth daily., Disp: 30 tablet, Rfl: 1 .  pantoprazole (PROTONIX) 40 MG tablet, Take 40 mg by mouth 2 (two) times daily., Disp: , Rfl:  .  tamsulosin (FLOMAX) 0.4 MG CAPS capsule, Take 1 capsule (0.4 mg total) by mouth daily., Disp: 30 capsule, Rfl: 3    ALLERGIES   Chantix     REVIEW OF SYSTEMS   Review of Systems  Constitutional: Negative for fever, chills, weight loss and malaise/fatigue.  HENT: Negative for congestion and hearing loss.   Eyes: Negative for blurred vision and double vision.  Respiratory: Positive for shortness of breath and wheezing. Negative for cough and hemoptysis.   Cardiovascular: Negative for chest pain, palpitations and orthopnea.  Gastrointestinal: Negative for heartburn, nausea, vomiting and abdominal pain.  Genitourinary: Negative for dysuria.  Musculoskeletal: Positive for back pain. Negative for myalgias and neck pain.  Skin: Negative for rash.  Neurological: Negative for dizziness, tingling, tremors and headaches.  Endo/Heme/Allergies: Does not bruise/bleed easily.  Psychiatric/Behavioral: Negative for depression and suicidal ideas. The patient is not nervous/anxious.   All other systems reviewed and are negative.    VS: BP 128/88 mmHg  Pulse 85  SpO2 95%     PHYSICAL EXAM  Physical Exam  Constitutional: He is oriented to person, place, and time. He appears well-developed and well-nourished. No distress.  HENT:  Head: Normocephalic and atraumatic.  Mouth/Throat: No oropharyngeal exudate.  Eyes: EOM are normal. Pupils are equal, round, and reactive to light. No scleral icterus.  Neck: Normal range of motion. Neck supple.  Cardiovascular: Normal rate, regular rhythm and normal heart sounds.   No murmur heard. Pulmonary/Chest: No stridor. No respiratory distress. He has no wheezes. He has no rales.  Abdominal: Soft. Bowel sounds are normal.  Musculoskeletal: Normal range of motion. He exhibits no edema.  Neurological: He  is alert and oriented to person, place, and time. No cranial nerve deficit.  Skin: Skin is warm. He is not diaphoretic.  Psychiatric: He has a normal mood and affect.              IMAGING    Ct Chest Wo Contrast  04/03/2015  CLINICAL DATA:  Pulmonary nodule follow-up. EXAM: CT CHEST WITHOUT CONTRAST TECHNIQUE: Multidetector CT imaging of the  chest was performed following the standard protocol without IV contrast. COMPARISON:  02/03/2015 FINDINGS: Mediastinum / Lymph Nodes: There is no axillary lymphadenopathy. No mediastinal lymphadenopathy. There is no hilar lymphadenopathy. The heart size is normal. No pericardial effusion. Coronary artery calcification is noted. The esophagus has normal imaging features. Lungs / Pleura: 7 mm right upper lobe pulmonary nodule seen on the previous study is now 9 mm. 3 mm adjacent satellite nodule measured on the previous study is stable at 3 mm. 3 mm left upper lobe nodule (image 29 series 3) is unchanged. 3-4 mm nodules seen in the lingula are also stable. Stable 5 mm left lower lobe nodule on image 46. 6 mm right middle lobe nodule on image 46 series 3 is unchanged. No focal airspace consolidation. No pulmonary edema or pleural effusion. Upper Abdomen: Calcified granuloma noted in the liver. Imaged portions of the upper abdomen are otherwise unremarkable. MSK / Soft Tissues: Bone windows reveal no worrisome lytic or sclerotic osseous lesions. Nonacute posterior right rib fracture evident 10 mm subcutaneous lesion in the anterior left chest wall may be a sebaceous cyst. IMPRESSION: 1. Multiple bilateral pulmonary nodules with the dominant lesion identified in the right upper lobe. This nodule has progressed from 7 mm previously up to 9 mm on today's study. Given the interval progression over 2 months and size at 9 mm today, PET-CT is recommended assess for hypermetabolism within this lesion. 2. Multiple other scattered bilateral pulmonary nodules measuring in the  3-6 mm size range. These appear unchanged in the interval. Electronically Signed   By: Kennith CenterEric  Mansell M.D.   On: 04/03/2015 17:04    Images reviewed 04/06/2015    ASSESSMENT/PLAN   45 yo white male with probable underlying COPD based on signs and symptoms and active smoking, with RUL nodule that has grown in last 2 months which is highly suspicious for primary lung cancer  For his COPD 1.check alpha one levels 2.will need full set of PFT's with 6 MWT 3.Start Incruse 4.continue symbicort and ventolin as needed 5.smoking cessation strongly advised  For RUL nodule 1.will order PET SCAN 2.WIll need to Discuss with Dr. Thelma BargeAKS regarding combination ENB and Surgical Resection  If unable to obtain PET scan, will discuss with Dr. Thelma Bargeaks and assess next plan of action and obtain PFT's ASAP   I have personally obtained a history, examined the patient, evaluated laboratory and independently reviewed imaging results, formulated the assessment and plan and placed orders.  The Patient requires high complexity decision making for assessment and support, frequent evaluation and titration of therapies, application of advanced monitoring technologies and extensive interpretation of multiple databases.   Patient/Family are satisfied with Plan of action and management. All questions answered  Lucie LeatherKurian David Lynnsey Barbara, M.D.  [facility]  Medical Director [facility] Medical Director [facility]

## 2015-04-10 ENCOUNTER — Telehealth: Payer: Self-pay | Admitting: *Deleted

## 2015-04-10 DIAGNOSIS — R911 Solitary pulmonary nodule: Secondary | ICD-10-CM

## 2015-04-10 DIAGNOSIS — J449 Chronic obstructive pulmonary disease, unspecified: Secondary | ICD-10-CM

## 2015-04-10 NOTE — Telephone Encounter (Signed)
SMW & PFT ordered ASAP per KK. Order placed and appt scheduled.

## 2015-04-11 ENCOUNTER — Ambulatory Visit (INDEPENDENT_AMBULATORY_CARE_PROVIDER_SITE_OTHER): Payer: 59 | Admitting: *Deleted

## 2015-04-11 ENCOUNTER — Ambulatory Visit
Admission: RE | Admit: 2015-04-11 | Discharge: 2015-04-11 | Disposition: A | Discharge status: Home / Self Care | Payer: 59 | Source: Ambulatory Visit | Attending: Internal Medicine | Admitting: Internal Medicine

## 2015-04-11 DIAGNOSIS — R918 Other nonspecific abnormal finding of lung field: Secondary | ICD-10-CM | POA: Diagnosis not present

## 2015-04-11 DIAGNOSIS — R911 Solitary pulmonary nodule: Secondary | ICD-10-CM | POA: Diagnosis not present

## 2015-04-11 DIAGNOSIS — J449 Chronic obstructive pulmonary disease, unspecified: Secondary | ICD-10-CM

## 2015-04-11 LAB — PULMONARY FUNCTION TEST
DL/VA % pred: 97 %
DL/VA: 4.43 ml/min/mmHg/L
DLCO UNC: 36.74 ml/min/mmHg
DLCO unc % pred: 123 %
FEF 25-75 POST: 1.33 L/s
FEF 25-75 Pre: 1.18 L/sec
FEF2575-%Change-Post: 12 %
FEF2575-%Pred-Post: 37 %
FEF2575-%Pred-Pre: 33 %
FEV1-%CHANGE-POST: 2 %
FEV1-%PRED-PRE: 54 %
FEV1-%Pred-Post: 55 %
FEV1-POST: 2.12 L
FEV1-Pre: 2.07 L
FEV1FVC-%Change-Post: -2 %
FEV1FVC-%Pred-Pre: 77 %
FEV6-%Change-Post: 5 %
FEV6-%PRED-POST: 75 %
FEV6-%PRED-PRE: 71 %
FEV6-POST: 3.53 L
FEV6-PRE: 3.34 L
FEV6FVC-%CHANGE-POST: 0 %
FEV6FVC-%PRED-POST: 102 %
FEV6FVC-%PRED-PRE: 101 %
FVC-%Change-Post: 4 %
FVC-%PRED-PRE: 69 %
FVC-%Pred-Post: 73 %
FVC-POST: 3.55 L
FVC-PRE: 3.39 L
PRE FEV6/FVC RATIO: 99 %
Post FEV1/FVC ratio: 60 %
Post FEV6/FVC ratio: 99 %
Pre FEV1/FVC ratio: 61 %
RV % PRED: 324 %
RV: 5.94 L
TLC % PRED: 154 %
TLC: 10.12 L

## 2015-04-11 LAB — GLUCOSE, CAPILLARY: Glucose-Capillary: 87 mg/dL (ref 65–99)

## 2015-04-11 MED ORDER — FLUDEOXYGLUCOSE F - 18 (FDG) INJECTION
12.5300 | Freq: Once | INTRAVENOUS | Status: AC | PRN
  Administered 2015-04-11: 12.53 via INTRAVENOUS

## 2015-04-11 NOTE — Progress Notes (Signed)
SMW performed today. 

## 2015-04-11 NOTE — Progress Notes (Signed)
PFT performed today. 

## 2015-04-13 ENCOUNTER — Telehealth: Payer: Self-pay | Admitting: Internal Medicine

## 2015-04-13 MED ORDER — UMECLIDINIUM BROMIDE 62.5 MCG/INH IN AEPB
1.0000 | INHALATION_SPRAY | Freq: Every day | RESPIRATORY_TRACT | Status: DC
Start: 2015-04-13 — End: 2015-05-10

## 2015-04-13 NOTE — Telephone Encounter (Signed)
Pt wife states pt needs a rx for Incruse, she went to the pharmacy and was unable to use the discount card without a rx. [facility]

## 2015-04-13 NOTE — Telephone Encounter (Signed)
This encounter was created in error - please disregard.

## 2015-04-13 NOTE — Telephone Encounter (Signed)
Informed wife Incruse was sent to pharmacy.

## 2015-04-26 ENCOUNTER — Other Ambulatory Visit: Payer: Self-pay | Admitting: Surgical

## 2015-04-26 MED ORDER — PANTOPRAZOLE SODIUM 40 MG PO TBEC: 40.0000 mg | DELAYED_RELEASE_TABLET | Freq: Two times a day (BID) | ORAL | Status: DC

## 2015-05-03 ENCOUNTER — Ambulatory Visit (INDEPENDENT_AMBULATORY_CARE_PROVIDER_SITE_OTHER): Payer: 59 | Admitting: Internal Medicine

## 2015-05-03 ENCOUNTER — Encounter: Payer: Self-pay | Admitting: Internal Medicine

## 2015-05-03 VITALS — BP 130/78 | HR 85 | Ht 69.0 in | Wt 180.0 lb

## 2015-05-03 DIAGNOSIS — J449 Chronic obstructive pulmonary disease, unspecified: Secondary | ICD-10-CM

## 2015-05-03 DIAGNOSIS — R911 Solitary pulmonary nodule: Secondary | ICD-10-CM

## 2015-05-03 MED ORDER — NICOTINE 14 MG/24HR TD PT24: 14.0000 mg | MEDICATED_PATCH | Freq: Every day | TRANSDERMAL | Status: DC

## 2015-05-03 MED ORDER — GLYCOPYRROLATE-FORMOTEROL 9-4.8 MCG/ACT IN AERO: 2.0000 | INHALATION_SPRAY | Freq: Two times a day (BID) | RESPIRATORY_TRACT | Status: AC

## 2015-05-03 MED ORDER — FLUTICASONE PROPIONATE HFA 110 MCG/ACT IN AERO: 2.0000 | INHALATION_SPRAY | Freq: Two times a day (BID) | RESPIRATORY_TRACT | Status: DC

## 2015-05-03 MED ORDER — GLYCOPYRROLATE-FORMOTEROL 9-4.8 MCG/ACT IN AERO: 2.0000 | INHALATION_SPRAY | Freq: Two times a day (BID) | RESPIRATORY_TRACT | Status: DC

## 2015-05-03 NOTE — Patient Instructions (Signed)
Chronic Obstructive Pulmonary Disease Chronic obstructive pulmonary disease (COPD) is a common lung condition in which airflow from the lungs is limited. COPD is a general term that can be used to describe many different lung problems that limit airflow, including both chronic bronchitis and emphysema. If you have COPD, your lung function will probably never return to normal, but there are measures you can take to improve lung function and make yourself feel better. CAUSES   Smoking (common).  Exposure to secondhand smoke.  Genetic problems.  Chronic inflammatory lung diseases or recurrent infections. SYMPTOMS  Shortness of breath, especially with physical activity.  Deep, persistent (chronic) cough with a large amount of thick mucus.  Wheezing.  Rapid breaths (tachypnea).  Gray or bluish discoloration (cyanosis) of the skin, especially in your fingers, toes, or lips.  Fatigue.  Weight loss.  Frequent infections or episodes when breathing symptoms become much worse (exacerbations).  Chest tightness. DIAGNOSIS Your health care provider will take a medical history and perform a physical examination to diagnose COPD. Additional tests for COPD may include:  Lung (pulmonary) function tests.  Chest X-ray.  CT scan.  Blood tests. TREATMENT  Treatment for COPD may include:  Inhaler and nebulizer medicines. These help manage the symptoms of COPD and make your breathing more comfortable.  Supplemental oxygen. Supplemental oxygen is only helpful if you have a low oxygen level in your blood.  Exercise and physical activity. These are beneficial for nearly all people with COPD.  Lung surgery or transplant.  Nutrition therapy to gain weight, if you are underweight.  Pulmonary rehabilitation. This may involve working with a team of health care providers and specialists, such as respiratory, occupational, and physical therapists. HOME CARE INSTRUCTIONS  Take all medicines  (inhaled or pills) as directed by your health care provider.  Avoid over-the-counter medicines or cough syrups that dry up your airway (such as antihistamines) and slow down the elimination of secretions unless instructed otherwise by your health care provider.  If you are a smoker, the most important thing that you can do is stop smoking. Continuing to smoke will cause further lung damage and breathing trouble. Ask your health care provider for help with quitting smoking. He or she can direct you to community resources or hospitals that provide support.  Avoid exposure to irritants such as smoke, chemicals, and fumes that aggravate your breathing.  Use oxygen therapy and pulmonary rehabilitation if directed by your health care provider. If you require home oxygen therapy, ask your health care provider whether you should purchase a pulse oximeter to measure your oxygen level at home.  Avoid contact with individuals who have a contagious illness.  Avoid extreme temperature and humidity changes.  Eat healthy foods. Eating smaller, more frequent meals and resting before meals may help you maintain your strength.  Stay active, but balance activity with periods of rest. Exercise and physical activity will help you maintain your ability to do things you want to do.  Preventing infection and hospitalization is very important when you have COPD. Make sure to receive all the vaccines your health care provider recommends, especially the pneumococcal and influenza vaccines. Ask your health care provider whether you need a pneumonia vaccine.  Learn and use relaxation techniques to manage stress.  Learn and use controlled breathing techniques as directed by your health care provider. Controlled breathing techniques include:  Pursed lip breathing. Start by breathing in (inhaling) through your nose for 1 second. Then, purse your lips as if you were   going to whistle and breathe out (exhale) through the  pursed lips for 2 seconds.  Diaphragmatic breathing. Start by putting one hand on your abdomen just above your waist. Inhale slowly through your nose. The hand on your abdomen should move out. Then purse your lips and exhale slowly. You should be able to feel the hand on your abdomen moving in as you exhale.  Learn and use controlled coughing to clear mucus from your lungs. Controlled coughing is a series of short, progressive coughs. The steps of controlled coughing are: 1. Lean your head slightly forward. 2. Breathe in deeply using diaphragmatic breathing. 3. Try to hold your breath for 3 seconds. 4. Keep your mouth slightly open while coughing twice. 5. Spit any mucus out into a tissue. 6. Rest and repeat the steps once or twice as needed. SEEK MEDICAL CARE IF:  You are coughing up more mucus than usual.  There is a change in the color or thickness of your mucus.  Your breathing is more labored than usual.  Your breathing is faster than usual. SEEK IMMEDIATE MEDICAL CARE IF:  You have shortness of breath while you are resting.  You have shortness of breath that prevents you from:  Being able to talk.  Performing your usual physical activities.  You have chest pain lasting longer than 5 minutes.  Your skin color is more cyanotic than usual.  You measure low oxygen saturations for longer than 5 minutes with a pulse oximeter. MAKE SURE YOU:  Understand these instructions.  Will watch your condition.  Will get help right away if you are not doing well or get worse.   This information is not intended to replace advice given to you by your health care provider. Make sure you discuss any questions you have with your health care provider.   Document Released: 10/02/2004 Document Revised: 01/13/2014 Document Reviewed: 08/19/2012 Elsevier Interactive Patient Education 2016 [employer]  

## 2015-05-03 NOTE — Progress Notes (Signed)
[facility]      Date: 05/03/2015,   MRN# [id number] Jimmy Howell 1970-12-25 Code Status:  Code Status History    This patient does not have a recorded code status. Please follow your organizational policy for patients in this situation.     Hosp day:@LENGTHOFSTAYDAYS @ Referring MD: @ATDPROV @     PCP:      AdmissionWeight: 180 lb (81.647 kg)                 CurrentWeight: 180 lb (81.647 kg) Jimmy Howell is a 45 y.o. old male seen in consultation for COPD and lung nodules at the request of Dr. Birdie SonsSonnenberg     CHIEF COMPLAINT:   Follow up Wheezing and SOB   HISTORY OF PRESENT ILLNESS  Patient with intermittent wheezing, usng symbicort and albuterol as needef, incruse has not really helped No acute issues now, no signs of infection at this time  PFT reviewed 04/2015 Ratio 61% FEV1 54% FVC 69% RV 324% TLC 154$  PET scan reviewed with patient     Current outpatient prescriptions:  .  albuterol (PROVENTIL HFA;VENTOLIN HFA) 108 (90 Base) MCG/ACT inhaler, Inhale into the lungs every 6 (six) hours as needed for wheezing or shortness of breath., Disp: , Rfl:  .  budesonide-formoterol (SYMBICORT) 160-4.5 MCG/ACT inhaler, Inhale 2 puffs into the lungs 2 (two) times daily., Disp: , Rfl:  .  buPROPion (WELLBUTRIN) 75 MG tablet, Please take 150 mg (2 tablets) daily for 3 days, then increase to 150 mg (2 tablets) twice daily., Disp: 120 tablet, Rfl: 1 .  meloxicam (MOBIC) 15 MG tablet, Take 1 tablet (15 mg total) by mouth daily., Disp: 30 tablet, Rfl: 1 .  pantoprazole (PROTONIX) 40 MG tablet, Take 1 tablet (40 mg total) by mouth 2 (two) times daily., Disp: 60 tablet, Rfl: 1 .  tamsulosin (FLOMAX) 0.4 MG CAPS capsule, Take 1 capsule (0.4 mg total) by mouth daily., Disp: 30 capsule, Rfl: 3 .  umeclidinium bromide (INCRUSE ELLIPTA) 62.5 MCG/INH AEPB, Inhale 1 puff into the lungs daily., Disp: 30 each, Rfl: 5    ALLERGIES    Chantix     REVIEW OF SYSTEMS   Review of Systems  Constitutional: Negative for fever and chills.  HENT: Negative for congestion.   Respiratory: Positive for cough, shortness of breath and wheezing. Negative for hemoptysis.   Cardiovascular: Negative for chest pain, palpitations and orthopnea.  Gastrointestinal: Negative for heartburn, nausea, vomiting and abdominal pain.  Musculoskeletal: Negative for back pain.  Neurological: Negative for tingling.  Psychiatric/Behavioral: The patient is not nervous/anxious.   All other systems reviewed and are negative.    VS: BP 130/78 mmHg  Pulse 85  Ht 5\' 9"  (1.753 m)  Wt 180 lb (81.647 kg)  BMI 26.57 kg/m2  SpO2 98%     PHYSICAL EXAM  Physical Exam  Constitutional: He is oriented to person, place, and time. No distress.  Eyes: EOM are normal.  Neck: Normal range of motion.  Cardiovascular: Normal rate, regular rhythm and normal heart sounds.   No murmur heard. Pulmonary/Chest: No respiratory distress. He has no wheezes. He has no rales.  Musculoskeletal: Normal range of motion. He exhibits no edema.  Neurological: He is alert and oriented to person, place, and time.  Skin: Skin is warm. He is not diaphoretic.  Psychiatric: He has a normal mood and affect.     IMAGING    Ct Chest Wo Contrast  04/03/2015  CLINICAL DATA:  Pulmonary nodule follow-up. EXAM: CT CHEST WITHOUT CONTRAST TECHNIQUE: Multidetector CT imaging of the chest was performed following the standard protocol without IV contrast. COMPARISON:  02/03/2015 FINDINGS: Mediastinum / Lymph Nodes: There is no axillary lymphadenopathy. No mediastinal lymphadenopathy. There is no hilar lymphadenopathy. The heart size is normal. No pericardial effusion. Coronary artery calcification is noted. The esophagus has normal imaging features. Lungs / Pleura: 7 mm right upper lobe pulmonary nodule seen on the previous study is now 9 mm. 3 mm adjacent satellite nodule measured on the  previous study is stable at 3 mm. 3 mm left upper lobe nodule (image 29 series 3) is unchanged. 3-4 mm nodules seen in the lingula are also stable. Stable 5 mm left lower lobe nodule on image 46. 6 mm right middle lobe nodule on image 46 series 3 is unchanged. No focal airspace consolidation. No pulmonary edema or pleural effusion. Upper Abdomen: Calcified granuloma noted in the liver. Imaged portions of the upper abdomen are otherwise unremarkable. MSK / Soft Tissues: Bone windows reveal no worrisome lytic or sclerotic osseous lesions. Nonacute posterior right rib fracture evident 10 mm subcutaneous lesion in the anterior left chest wall may be a sebaceous cyst. IMPRESSION: 1. Multiple bilateral pulmonary nodules with the dominant lesion identified in the right upper lobe. This nodule has progressed from 7 mm previously up to 9 mm on today's study. Given the interval progression over 2 months and size at 9 mm today, PET-CT is recommended assess for hypermetabolism within this lesion. 2. Multiple other scattered bilateral pulmonary nodules measuring in the 3-6 mm size range. These appear unchanged in the interval. Electronically Signed   By: Kennith Center M.D.   On: 04/03/2015 17:04   Nm Pet Image Initial (pi) Whole Body  04/11/2015  CLINICAL DATA:  Initial treatment strategy for pulmonary nodule EXAM: NUCLEAR MEDICINE PET WHOLE BODY TECHNIQUE: 12.5 mCi F-18 FDG was injected intravenously. Full-ring PET imaging was performed from the vertex to the feet after the radiotracer. CT data was obtained and used for attenuation correction and anatomic localization. FASTING BLOOD GLUCOSE:  Value:  87 mg/dl COMPARISON:  None. FINDINGS: Head/Neck: No hypermetabolic lymph nodes in the neck. Chest: No hypermetabolic mediastinal or hilar nodes. In the right upper lobe there is a 9 x 7 mm nodule (8 mm mean diameter) on image 24 of series 3. The SUV max associated with this nodule is equal to 1.25 which is below the malignant  range. Other small nodules are unchanged from 04/03/2015 including the left lower lobe measuring 6 mm. Nodule in the right middle lobe is unchanged measuring 5 mm, image 154 of series 3. Abdomen/Pelvis: No abnormal hypermetabolic activity within the liver, pancreas, adrenal glands, or spleen. Nhypermetabolic lymph nodes in the abdomen or pelvis. Skeleton: No focal hypermetabolic activity to suggest skeletal metastasis. Extremities: No hypermetabolic activity to suggest metastasis. IMPRESSION: 1. Low level, non malignant range FDG uptake is associated with the 8 mm right upper lobe lung nodule. The size of this nodule is just below the threshold add which the reliability of PET-CT diminishes. The other small nodules are stable in size and are too small to reliably characterize by PET-CT. Non-contrast chest CT at 3-6 months is recommended. If the nodules are stable at time of repeat CT, then future CT at 18-24 months (from today's scan) is considered optional for low-risk patients, but is recommended for high-risk patients. This recommendation follows the consensus statement: Guidelines for Management of Incidental Pulmonary Nodules Detected on CT Images:From  the Fleischner Society 2017; published online before print (10.1148/radiol.7564332951). Electronically Signed   By: Signa Kell M.D.   On: 04/11/2015 10:48    Images reviewed 05/03/2015    ASSESSMENT/PLAN   45 yo white male with moderate COPD Gold Stage A/B active smoking, patient with persistant wheezing , also with RUL nodule that has low PET SUV activity.    1.will stop symbcort/incruse and start Flovent 2.will start bevespi (long acting AC and LABA) 3. ventolin as needed 4.smoking cessation strongly advised 5.nicotine patches ordered  For RUL nodule -recommend Follow up CT chest in 3 months and will obtain Dr. Thelma Barge referral for further assessment.   Follow up in 1 month for COPD  I have personally obtained a history, examined the  patient, evaluated laboratory and independently reviewed imaging results, formulated the assessment and plan and placed orders.  The Patient requires high complexity decision making for assessment and support, frequent evaluation and titration of therapies, application of advanced monitoring technologies and extensive interpretation of multiple databases.   Patient/Family are satisfied with Plan of action and management. All questions answered  Lucie Leather, M.D.  [facility]  Medical Director [facility] Medical Director [facility] Cardio-Pulmonary Department

## 2015-05-10 ENCOUNTER — Encounter: Payer: Self-pay | Admitting: Cardiothoracic Surgery

## 2015-05-10 ENCOUNTER — Encounter: Payer: Self-pay | Admitting: *Deleted

## 2015-05-10 ENCOUNTER — Inpatient Hospital Stay: Payer: 59 | Attending: Cardiothoracic Surgery | Admitting: Cardiothoracic Surgery

## 2015-05-10 ENCOUNTER — Encounter (INDEPENDENT_AMBULATORY_CARE_PROVIDER_SITE_OTHER): Payer: Self-pay

## 2015-05-10 VITALS — BP 140/91 | HR 75 | Temp 98.0°F | Ht 69.0 in | Wt 178.7 lb

## 2015-05-10 DIAGNOSIS — R918 Other nonspecific abnormal finding of lung field: Secondary | ICD-10-CM | POA: Diagnosis not present

## 2015-05-10 NOTE — Progress Notes (Signed)
Patient ID: Jimmy Howell, male   DOB: 1970-04-15, 45 y.o.   MRN: [id number]  Chief Complaint  Patient presents with  . Lung Mass    consult    Referred By Dr. Belia Heman Reason for Referral Lung nodule  HPI Location, Quality, Duration, Severity, Timing, Context, Modifying Factors, Associated Signs and Symptoms.  Jimmy Howell is a 45 y.o. male.  This gentleman is a 45 year old man whose problems began in January when he experienced the acute onset of right chest wall pain. This persisted over couple weeks and was described as a "muscle tear". He presented to a primary care physician where he was placed on meloxicam with excellent results. After a few weeks of therapy the pain in his right chest wall abated. Of note is that he also had a chest x-ray made at this time which revealed a right upper lobe nodule. A CT scan was performed and was subsequently repeated in a couple weeks after treatment. The repeat CT scan showed a slight increase in the size of the nodule which now measured about 9 mm. A PET scan was performed which revealed no evidence of uptake within the right upper lobe nodule. Of note is that he did have a rib fracture present under the scapula which was present on the PET and the more recent CT scan. He did have a chest x-ray from a couple of years ago which did not reveal any pulmonary nodules. The patient was seen by Dr. Jeralene Huff. He'll obtain some pulmonary function studies which reveal moderate obstructive lung disease with an FEV1 of approximate 50% of predicted. The patient states that he does get quite short of breath with minimal activities. He smokes at least a pack cigarettes a day at the present time. He has smoked 2-3 packs of cigarettes per day for at least 30 years. 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 He states he is trying to cut back on his tobacco use. There is no family history of lung cancer.   Past Medical History  Diagnosis Date  .  Asthma   . COPD (chronic obstructive pulmonary disease) (HCC)   . Chickenpox   . GERD (gastroesophageal reflux disease)     Past Surgical History  Procedure Laterality Date  . Hernia repair    . Finger surgery      Family History  Problem Relation Age of Onset  . Arthritis      Parent  . Prostate cancer Father   . Hyperlipidemia      Parent  . Heart disease      Parent  . Hypertension      Parent  . Kidney disease      Parent  . Cancer Paternal Aunt     stomach    Social History Social History  Substance Use Topics  . Smoking status: Current Every Day Smoker -- 1.00 packs/day    Types: Cigarettes    Last Attempt to Quit: 02/09/2015  . Smokeless tobacco: None  . Alcohol Use: 0.0 oz/week    0 Standard drinks or equivalent per week     Comment: social    Allergies  Allergen Reactions  . Chantix [Varenicline] Nausea Only    Current Outpatient Prescriptions  Medication Sig Dispense Refill  . albuterol (PROVENTIL HFA;VENTOLIN HFA) 108 (90 Base) MCG/ACT inhaler Inhale into the lungs every 6 (six) hours as needed for wheezing or shortness of breath.    . budesonide-formoterol (SYMBICORT) 160-4.5 MCG/ACT inhaler Inhale  2 puffs into the lungs 2 (two) times daily.    Marland Kitchen. buPROPion (WELLBUTRIN) 75 MG tablet Please take 150 mg (2 tablets) daily for 3 days, then increase to 150 mg (2 tablets) twice daily. 120 tablet 1  . fluticasone (FLOVENT HFA) 110 MCG/ACT inhaler Inhale 2 puffs into the lungs 2 (two) times daily. 1 Inhaler 2  . nicotine (NICODERM CQ - DOSED IN MG/24 HOURS) 14 mg/24hr patch Place 1 patch (14 mg total) onto the skin daily. 30 patch 3  . pantoprazole (PROTONIX) 40 MG tablet Take 1 tablet (40 mg total) by mouth 2 (two) times daily. 60 tablet 1  . Glycopyrrolate-Formoterol (BEVESPI AEROSPHERE) 9-4.8 MCG/ACT AERO Inhale 2 puffs into the lungs 2 (two) times daily. (Patient not taking: Reported on 05/10/2015) 4.8 Inhaler 3  . meloxicam (MOBIC) 15 MG tablet Take 1  tablet (15 mg total) by mouth daily. (Patient not taking: Reported on 05/10/2015) 30 tablet 1   No current facility-administered medications for this visit.      Review of Systems A complete review of systems was asked and was negative except for the following positive findingsDifficulty with his vision, palpitations, shortness of breath, wheezing, heartburn, joint pain, hayfever.  Blood pressure 140/91, pulse 75, temperature 98 F (36.7 C), temperature source Tympanic, height 5\' 9"  (1.753 m), weight 178 lb 10.9 oz (81.05 kg), SpO2 100 %.  Physical Exam CONSTITUTIONAL:  Pleasant, well-developed, well-nourished, and in no acute distress. EYES: Pupils equal and reactive to light, Sclera non-icteric EARS, NOSE, MOUTH AND THROAT:  The oropharynx was clear.  Dentition is good repair.  Oral mucosa pink and moist. LYMPH NODES:  Lymph nodes in the neck and axillae were normal RESPIRATORY:  Lungs were clear.  Normal respiratory effort without pathologic use of accessory muscles of respiration CARDIOVASCULAR: Heart was regular without murmurs.  There were no carotid bruits. GI: The abdomen was soft, nontender, and nondistended. There were no palpable masses. There was no hepatosplenomegaly. There were normal bowel sounds in all quadrants. GU:  Rectal deferred.   MUSCULOSKELETAL:  Normal muscle strength and tone.  No clubbing or cyanosis.   SKIN:  There were no pathologic skin lesions.  There were no nodules on palpation. NEUROLOGIC:  Sensation is normal.  Cranial nerves are grossly intact. PSYCH:  Oriented to person, place and time.  Mood and affect are normal.  Data Reviewed CT scans and PET scans  I have personally reviewed the patient's imaging, laboratory findings and medical records.    Assessment    I have independently reviewed the patient's CT scan and PET scan. I do not see any uptake within the right upper lobe nodule. There are multiple bilateral scattered pulmonary nodules. None of  the shop on the PET scan but they're quite small overall. My hope is that these are all benign.     Plan    I did explain to the patient that the nodule was small and centered in the portion of the lung that is not easily available for digital palpation. It most likely would require a right upper lobectomy for complete surgical resection. Given his significant smoking history, poor pulmonary functions and smooth bordered PET negative nodule I did not recommend right upper lobectomy at this time. I do think it would be reason we'll have the patient come back in 3 months with another CT scan. I explained this to the patient and his wife and they are in agreement. They will come back to see me in 3  months time with another CT scan the chest.       Hulda Marin, MD 05/10/2015, 11:18 AM

## 2015-05-11 ENCOUNTER — Telehealth: Payer: Self-pay | Admitting: Cardiothoracic Surgery

## 2015-05-11 NOTE — Progress Notes (Signed)
Met with patient at thoracic surgery appointment. Introduced navigation program and reviewed plan of care. Will follow. 

## 2015-05-11 NOTE — Telephone Encounter (Signed)
i have called patient to schedule a 3 month follow up with [place] for [place] upper lobe nodule. No answer. I have left a message for patient to call me back to make this appointment. Patient will also need a CT prior to appointment.

## 2015-05-14 ENCOUNTER — Other Ambulatory Visit: Payer: Self-pay

## 2015-05-14 ENCOUNTER — Encounter: Payer: Self-pay | Admitting: Internal Medicine

## 2015-05-14 DIAGNOSIS — R918 Other nonspecific abnormal finding of lung field: Secondary | ICD-10-CM

## 2015-05-14 DIAGNOSIS — J449 Chronic obstructive pulmonary disease, unspecified: Secondary | ICD-10-CM | POA: Diagnosis not present

## 2015-05-14 NOTE — Telephone Encounter (Signed)
Appointment has been made on 07/13/15 with Dr Thelma Bargeaks.

## 2015-05-17 ENCOUNTER — Telehealth: Payer: Self-pay | Admitting: *Deleted

## 2015-05-17 DIAGNOSIS — J449 Chronic obstructive pulmonary disease, unspecified: Secondary | ICD-10-CM | POA: Diagnosis not present

## 2015-05-17 DIAGNOSIS — G4719 Other hypersomnia: Secondary | ICD-10-CM

## 2015-05-17 NOTE — Telephone Encounter (Signed)
Pt informed of the need for O2 at [facility] @ 2L per DK. Also will order split night per DK. Orders placed. Nothing further needed.

## 2015-06-01 ENCOUNTER — Telehealth: Payer: Self-pay | Admitting: Internal Medicine

## 2015-06-01 MED ORDER — ALBUTEROL SULFATE HFA 108 (90 BASE) MCG/ACT IN AERS: 2.0000 | INHALATION_SPRAY | RESPIRATORY_TRACT | Status: DC | PRN

## 2015-06-01 NOTE — Telephone Encounter (Signed)
*  STAT* If patient is at the pharmacy, call can be transferred to refill team.   1. Which medications need to be refilled? (please list name of each medication and dose if known) albuterol (PROVENTIL HFA;VENTOLIN HFA) 108 (90 Base) MCG/ACT inhaler (NOT the proair)   2. Which pharmacy/location (including street and city if local pharmacy) is medication to be sent to? [facility]  3. Do they need a 30 day or 90 day supply? 30 day

## 2015-06-01 NOTE — Telephone Encounter (Signed)
Medication sent to the pharmacy. Pt informed. Nothing further needed.

## 2015-06-05 ENCOUNTER — Ambulatory Visit (INDEPENDENT_AMBULATORY_CARE_PROVIDER_SITE_OTHER): Payer: 59 | Admitting: Internal Medicine

## 2015-06-05 ENCOUNTER — Encounter (INDEPENDENT_AMBULATORY_CARE_PROVIDER_SITE_OTHER): Payer: Self-pay

## 2015-06-05 ENCOUNTER — Encounter: Payer: Self-pay | Admitting: Internal Medicine

## 2015-06-05 VITALS — BP 126/86 | HR 87 | Wt 179.0 lb

## 2015-06-05 DIAGNOSIS — J449 Chronic obstructive pulmonary disease, unspecified: Secondary | ICD-10-CM | POA: Diagnosis not present

## 2015-06-05 NOTE — Progress Notes (Signed)
[facility]      Date: 06/05/2015,   MRN# [id number] Jimmy Howell 09-30-1970 Code Status:  Code Status History    This patient does not have a recorded code status. Please follow your organizational policy for patients in this situation.     Hosp day:@LENGTHOFSTAYDAYS @ Referring MD: @ATDPROV @     PCP:      AdmissionWeight: 179 lb (81.194 kg)                 CurrentWeight: 179 lb (81.194 kg) Jimmy Howell is a 45 y.o. old male seen in consultation for COPD and lung nodules at the request of Dr. Birdie SonsSonnenberg     CHIEF COMPLAINT:   Follow up COPD   HISTORY OF PRESENT ILLNESS  Patient with intermittent wheezing, bevespi and Flovent and uses albuterol as needed No acute issues now, no signs of infection at this time  Patient to have repeat Ct chest in July and follow up with Dr Thelma Bargeaks Is down to 1/2 ppd and quit date set to end of June On oxygen at night   PFT reviewed 04/2015 Ratio 61% FEV1 54% FVC 69% RV 324% TLC 154$  PET scan reviewed with patient     Current outpatient prescriptions:  .  albuterol (PROVENTIL HFA;VENTOLIN HFA) 108 (90 Base) MCG/ACT inhaler, Inhale 2 puffs into the lungs every 4 (four) hours as needed for wheezing or shortness of breath., Disp: 1 Inhaler, Rfl: 2 .  buPROPion (WELLBUTRIN) 75 MG tablet, Please take 150 mg (2 tablets) daily for 3 days, then increase to 150 mg (2 tablets) twice daily., Disp: 120 tablet, Rfl: 1 .  fluticasone (FLOVENT HFA) 110 MCG/ACT inhaler, Inhale 2 puffs into the lungs 2 (two) times daily., Disp: 1 Inhaler, Rfl: 2 .  Glycopyrrolate-Formoterol (BEVESPI AEROSPHERE) 9-4.8 MCG/ACT AERO, Inhale 2 puffs into the lungs 2 (two) times daily., Disp: 4.8 Inhaler, Rfl: 3 .  meloxicam (MOBIC) 15 MG tablet, Take 1 tablet (15 mg total) by mouth daily., Disp: 30 tablet, Rfl: 1 .  nicotine (NICODERM CQ - DOSED IN MG/24 HOURS) 21 mg/24hr patch, Place 21 mg onto the skin daily., Disp: , Rfl:  .   pantoprazole (PROTONIX) 40 MG tablet, Take 1 tablet (40 mg total) by mouth 2 (two) times daily., Disp: 60 tablet, Rfl: 1    ALLERGIES   Chantix     REVIEW OF SYSTEMS   Review of Systems  Constitutional: Negative for fever, chills, weight loss and malaise/fatigue.  HENT: Negative for congestion.   Respiratory: Negative for cough, hemoptysis, sputum production, shortness of breath and wheezing.   Cardiovascular: Negative for chest pain, palpitations, orthopnea and leg swelling.  Gastrointestinal: Negative for heartburn and nausea.  Neurological: Negative for tingling.  Psychiatric/Behavioral: The patient is not nervous/anxious.   All other systems reviewed and are negative.    VS: BP 126/86 mmHg  Pulse 87  Wt 179 lb (81.194 kg)  SpO2 97%     PHYSICAL EXAM  Physical Exam  Constitutional: He is oriented to person, place, and time. No distress.  Eyes: EOM are normal.  Cardiovascular: Normal rate, regular rhythm and normal heart sounds.   No murmur heard. Pulmonary/Chest: Effort normal and breath sounds normal. No stridor. No respiratory distress. He has no wheezes. He has no rales.  Neurological: He is alert and oriented to person, place, and time.  Skin: Skin is warm. He is not diaphoretic.  Psychiatric: He has a normal mood and affect.  ASSESSMENT/PLAN   45 yo white male with moderate COPD Gold Stage A/B active smoking, patient with intermittent wheezing , also with RUL nodule that has low PET SUV activity.    1.will continue Flovent 2.will continue  bevespi (long acting AC and LABA) 3. ventolin as needed 4.smoking cessation strongly advised 5.nicotine patches increased to 21 mg  For RUL nodule -recommend Follow up CT chest in 1 month and follow up with Dr. Thelma Barge.  follow up in 3 months for COPD   The Patient requires high complexity decision making for assessment and support, frequent evaluation and titration of therapies, application of advanced  monitoring technologies and extensive interpretation of multiple databases.   Patient satisfied with Plan of action and management. All questions answered  Lucie Leather, M.D.  [facility]  Medical Director [facility] Medical Director [facility] Cardio-Pulmonary Department

## 2015-06-05 NOTE — Patient Instructions (Signed)
Chronic Obstructive Pulmonary Disease Chronic obstructive pulmonary disease (COPD) is a common lung condition in which airflow from the lungs is limited. COPD is a general term that can be used to describe many different lung problems that limit airflow, including both chronic bronchitis and emphysema. If you have COPD, your lung function will probably never return to normal, but there are measures you can take to improve lung function and make yourself feel better. CAUSES   Smoking (common).  Exposure to secondhand smoke.  Genetic problems.  Chronic inflammatory lung diseases or recurrent infections. SYMPTOMS  Shortness of breath, especially with physical activity.  Deep, persistent (chronic) cough with a large amount of thick mucus.  Wheezing.  Rapid breaths (tachypnea).  Gray or bluish discoloration (cyanosis) of the skin, especially in your fingers, toes, or lips.  Fatigue.  Weight loss.  Frequent infections or episodes when breathing symptoms become much worse (exacerbations).  Chest tightness. DIAGNOSIS Your health care provider will take a medical history and perform a physical examination to diagnose COPD. Additional tests for COPD may include:  Lung (pulmonary) function tests.  Chest X-ray.  CT scan.  Blood tests. TREATMENT  Treatment for COPD may include:  Inhaler and nebulizer medicines. These help manage the symptoms of COPD and make your breathing more comfortable.  Supplemental oxygen. Supplemental oxygen is only helpful if you have a low oxygen level in your blood.  Exercise and physical activity. These are beneficial for nearly all people with COPD.  Lung surgery or transplant.  Nutrition therapy to gain weight, if you are underweight.  Pulmonary rehabilitation. This may involve working with a team of health care providers and specialists, such as respiratory, occupational, and physical therapists. HOME CARE INSTRUCTIONS  Take all medicines  (inhaled or pills) as directed by your health care provider.  Avoid over-the-counter medicines or cough syrups that dry up your airway (such as antihistamines) and slow down the elimination of secretions unless instructed otherwise by your health care provider.  If you are a smoker, the most important thing that you can do is stop smoking. Continuing to smoke will cause further lung damage and breathing trouble. Ask your health care provider for help with quitting smoking. He or she can direct you to community resources or hospitals that provide support.  Avoid exposure to irritants such as smoke, chemicals, and fumes that aggravate your breathing.  Use oxygen therapy and pulmonary rehabilitation if directed by your health care provider. If you require home oxygen therapy, ask your health care provider whether you should purchase a pulse oximeter to measure your oxygen level at home.  Avoid contact with individuals who have a contagious illness.  Avoid extreme temperature and humidity changes.  Eat healthy foods. Eating smaller, more frequent meals and resting before meals may help you maintain your strength.  Stay active, but balance activity with periods of rest. Exercise and physical activity will help you maintain your ability to do things you want to do.  Preventing infection and hospitalization is very important when you have COPD. Make sure to receive all the vaccines your health care provider recommends, especially the pneumococcal and influenza vaccines. Ask your health care provider whether you need a pneumonia vaccine.  Learn and use relaxation techniques to manage stress.  Learn and use controlled breathing techniques as directed by your health care provider. Controlled breathing techniques include:  Pursed lip breathing. Start by breathing in (inhaling) through your nose for 1 second. Then, purse your lips as if you were   going to whistle and breathe out (exhale) through the  pursed lips for 2 seconds.  Diaphragmatic breathing. Start by putting one hand on your abdomen just above your waist. Inhale slowly through your nose. The hand on your abdomen should move out. Then purse your lips and exhale slowly. You should be able to feel the hand on your abdomen moving in as you exhale.  Learn and use controlled coughing to clear mucus from your lungs. Controlled coughing is a series of short, progressive coughs. The steps of controlled coughing are: 1. Lean your head slightly forward. 2. Breathe in deeply using diaphragmatic breathing. 3. Try to hold your breath for 3 seconds. 4. Keep your mouth slightly open while coughing twice. 5. Spit any mucus out into a tissue. 6. Rest and repeat the steps once or twice as needed. SEEK MEDICAL CARE IF:  You are coughing up more mucus than usual.  There is a change in the color or thickness of your mucus.  Your breathing is more labored than usual.  Your breathing is faster than usual. SEEK IMMEDIATE MEDICAL CARE IF:  You have shortness of breath while you are resting.  You have shortness of breath that prevents you from:  Being able to talk.  Performing your usual physical activities.  You have chest pain lasting longer than 5 minutes.  Your skin color is more cyanotic than usual.  You measure low oxygen saturations for longer than 5 minutes with a pulse oximeter. MAKE SURE YOU:  Understand these instructions.  Will watch your condition.  Will get help right away if you are not doing well or get worse.   This information is not intended to replace advice given to you by your health care provider. Make sure you discuss any questions you have with your health care provider.   Document Released: 10/02/2004 Document Revised: 01/13/2014 Document Reviewed: 08/19/2012 Elsevier Interactive Patient Education 2016 [employer]  

## 2015-06-06 ENCOUNTER — Telehealth: Payer: Self-pay

## 2015-06-06 NOTE — Telephone Encounter (Signed)
Patient [facility] CT [facility] and then patient will immediately come to the office afterwards to see Dr. Thelma Bargeaks.   Called patient. No answer. All above information with locations and directions was left on his answering machine. Asked for a return phone call verifying that he got this message.

## 2015-06-06 NOTE — Telephone Encounter (Signed)
Called once again at this time. No answer. Left voicemail to call back in and confirm that he has received my earlier message.

## 2015-06-08 NOTE — Telephone Encounter (Signed)
Called patient once again at this time. No answer. Unable to leave message as mailbox is full.   Placed reminder note in EPIC to call patient once again at end of June to remind of these appointments.

## 2015-06-17 DIAGNOSIS — J449 Chronic obstructive pulmonary disease, unspecified: Secondary | ICD-10-CM | POA: Diagnosis not present

## 2015-06-21 ENCOUNTER — Ambulatory Visit: Payer: 59 | Attending: Pulmonary Disease

## 2015-06-21 DIAGNOSIS — G4710 Hypersomnia, unspecified: Secondary | ICD-10-CM | POA: Diagnosis present

## 2015-06-21 DIAGNOSIS — G4761 Periodic limb movement disorder: Secondary | ICD-10-CM | POA: Diagnosis not present

## 2015-06-22 ENCOUNTER — Ambulatory Visit (INDEPENDENT_AMBULATORY_CARE_PROVIDER_SITE_OTHER): Payer: 59 | Admitting: Family Medicine

## 2015-06-22 ENCOUNTER — Encounter: Payer: Self-pay | Admitting: Family Medicine

## 2015-06-22 VITALS — BP 114/78 | HR 82 | Temp 98.3°F | Ht 69.0 in | Wt 182.6 lb

## 2015-06-22 DIAGNOSIS — S46812A Strain of other muscles, fascia and tendons at shoulder and upper arm level, left arm, initial encounter: Secondary | ICD-10-CM | POA: Diagnosis not present

## 2015-06-22 DIAGNOSIS — M7710 Lateral epicondylitis, unspecified elbow: Secondary | ICD-10-CM | POA: Insufficient documentation

## 2015-06-22 DIAGNOSIS — M7711 Lateral epicondylitis, right elbow: Secondary | ICD-10-CM

## 2015-06-22 DIAGNOSIS — J449 Chronic obstructive pulmonary disease, unspecified: Secondary | ICD-10-CM | POA: Diagnosis not present

## 2015-06-22 DIAGNOSIS — S46819A Strain of other muscles, fascia and tendons at shoulder and upper arm level, unspecified arm, initial encounter: Secondary | ICD-10-CM | POA: Insufficient documentation

## 2015-06-22 MED ORDER — MELOXICAM 15 MG PO TABS: 15.0000 mg | ORAL_TABLET | Freq: Every day | ORAL | Status: DC

## 2015-06-22 MED ORDER — CHLORZOXAZONE 375 MG PO TABS: 375.0000 mg | ORAL_TABLET | Freq: Three times a day (TID) | ORAL | Status: DC | PRN

## 2015-06-22 NOTE — Assessment & Plan Note (Signed)
Given persistence and lack of improvement will refer to sports medicine for consideration of ultrasound of the area. Advised on icing and continuing the brace. We'll trial meloxicam as well.

## 2015-06-22 NOTE — Assessment & Plan Note (Signed)
Suspect neck discomfort is related to trapezius strain. Neurologically intact in his upper and lower extremities. We'll trial lorzone as a muscle relaxer given its decreased likelihood of drowsiness. Meloxicam for discomfort. He continues heat on the area. He'll continue to monitor. Given return precautions.

## 2015-06-22 NOTE — Progress Notes (Signed)
Pre visit review using our clinic review tool, if applicable. No additional management support is needed unless otherwise documented below in the visit note. 

## 2015-06-22 NOTE — Assessment & Plan Note (Signed)
Following with pulmonology for this. Has been on bevespi and Flovent recently. Does note decreased use of his albuterol inhaler though still using 3-4 times daily. Benign lung exam. We'll send a message to patient's pulmonologist regarding his improvement and continued use of albuterol to get their input. He'll keep follow-up with the pulmonologist and the thoracic surgeon regarding his lung nodule.

## 2015-06-22 NOTE — Progress Notes (Signed)
Patient ID: Jimmy Howell, male   DOB: 1970/08/23, 45 y.o.   MRN: [id number]  Marikay Alar, MD Phone: [phone]  Jimmy Howell is a 45 y.o. male who presents today for follow-up.  Tennis elbow: Patient notes right sided lateral epicondylitis. Notes he had this issue several years ago and had an injection in an improved. Recently he was running a tamp at work and this exacerbated it. Has been going on for about 6 weeks. Did get some better with the brace though has recently gotten worse. Not icing it. Using ibuprofen with little benefit.  Trapezius strain: Patient notes he was driving his hair when he felt a pull in his left trapezius. Notes no radiation. No numbness or weakness in his arms. Hurts more with particular movements of his neck and head. Ice helped. Ibuprofen did not.   He is being followed by pulmonology for COPD, possible OSA, and a lung nodule. Notes he did a sleep study last night. Does not sound as though there was any CPAP titration done. Does report he had been on overnight oxygen recently as they found that his pulse ox dropped at night. He's been on bevespi and Flovent. His albuterol usage has improved from 6-7 times per day to 3-4 times per day when he feels short of breath. Still smoking half a pack per day. His quit date is June 30. Taking Wellbutrin and nicotine patches. He is also seen a thoracic surgeon with regards to his lung nodule and plans on having a repeat CT scan on July 7. No shortness of breath at this time.  PMH: Smoker ROS see history of present illness  Objective  Physical Exam Filed Vitals:   06/22/15 1344  BP: 114/78  Pulse: 82  Temp: 98.3 F (36.8 C)    BP Readings from Last 3 Encounters:  06/22/15 114/78  06/05/15 126/86  05/10/15 140/91   Wt Readings from Last 3 Encounters:  06/22/15 182 lb 9.6 oz (82.827 kg)  06/05/15 179 lb (81.194 kg)  05/10/15 178 lb 10.9 oz (81.05 kg)    Physical Exam  Constitutional: He is  well-developed, well-nourished, and in no distress.  HENT:  Head: Normocephalic and atraumatic.  Right Ear: External ear normal.  Left Ear: External ear normal.  Cardiovascular: Normal rate, regular rhythm and normal heart sounds.   Pulmonary/Chest: Effort normal and breath sounds normal.  Musculoskeletal: He exhibits no edema.  No midline spine tenderness, no midline spine step-off, cervical paraspinous muscle tenderness and spasm and trapezius muscle tenderness and spasm in the midportion of the trapezius, discomfort on extension and rotation though has full range of motion in the neck Right lateral epicondyles with point tenderness over the muscular attachment, discomfort on resisted pronation, left lateral epicondyles with no tenderness  Neurological: He is alert.  5/5 strength in bilateral biceps, triceps, grip, quads, hamstrings, plantar and dorsiflexion, sensation to light touch intact in bilateral UE and LE, normal gait, 2+ patellar reflexes  Skin: Skin is warm and dry. He is not diaphoretic.     Assessment/Plan: Please see individual problem list.  COPD (chronic obstructive pulmonary disease) (HCC) Following with pulmonology for this. Has been on bevespi and Flovent recently. Does note decreased use of his albuterol inhaler though still using 3-4 times daily. Benign lung exam. We'll send a message to patient's pulmonologist regarding his improvement and continued use of albuterol to get their input. He'll keep follow-up with the pulmonologist and the thoracic surgeon regarding his lung nodule.  Tennis  elbow Given persistence and lack of improvement will refer to sports medicine for consideration of ultrasound of the area. Advised on icing and continuing the brace. We'll trial meloxicam as well.  Trapezius strain Suspect neck discomfort is related to trapezius strain. Neurologically intact in his upper and lower extremities. We'll trial lorzone as a muscle relaxer given its decreased  likelihood of drowsiness. Meloxicam for discomfort. He continues heat on the area. He'll continue to monitor. Given return precautions.    Orders Placed This Encounter  Procedures  . Ambulatory referral to Sports Medicine    Referral Priority:  Routine    Referral Type:  Consultation    Number of Visits Requested:  1    Meds ordered this encounter  Medications  . Chlorzoxazone 375 MG TABS    Sig: Take 375 mg by mouth every 8 (eight) hours as needed.    Dispense:  60 tablet    Refill:  0  . meloxicam (MOBIC) 15 MG tablet    Sig: Take 1 tablet (15 mg total) by mouth daily.    Dispense:  30 tablet    Refill:  1    Marikay AlarEric Ammaar Encina, MD [facility]

## 2015-06-22 NOTE — Patient Instructions (Signed)
Nice to see you. We are going to refer you to sports medicine for your right tennis elbow. We will trial a muscle relaxer for your neck. This is likely a muscle strain and spasm in her trapezius. You can continue ibuprofen and heat to the area. We will let your pulmonologist no regarding your use of albuterol and see if they have any additional thoughts. If you develop numbness, weakness, worsening pain, shortness of breath, or any new or changing symptoms please seek medical attention.

## 2015-06-25 DIAGNOSIS — G4761 Periodic limb movement disorder: Secondary | ICD-10-CM | POA: Diagnosis not present

## 2015-06-29 ENCOUNTER — Telehealth: Payer: Self-pay | Admitting: *Deleted

## 2015-06-29 NOTE — Telephone Encounter (Signed)
Tried to call pt with sleep study results. VM full and unable to LM. Will call back later.

## 2015-07-02 NOTE — Telephone Encounter (Signed)
Tried to call pt but unable to LM. Called wife and had to [employer]MOM to have pt to call back for results.

## 2015-07-02 NOTE — Telephone Encounter (Signed)
Pt's wife informed of Sleep study results.  Wife is asking why his ONO shows his O2 levels dropped but on the sleep study he only got as low as 91%. Please advise.

## 2015-07-04 ENCOUNTER — Other Ambulatory Visit: Payer: Self-pay | Admitting: Family Medicine

## 2015-07-09 DIAGNOSIS — M6740 Ganglion, unspecified site: Secondary | ICD-10-CM | POA: Insufficient documentation

## 2015-07-09 DIAGNOSIS — K219 Gastro-esophageal reflux disease without esophagitis: Secondary | ICD-10-CM | POA: Insufficient documentation

## 2015-07-09 DIAGNOSIS — J45909 Unspecified asthma, uncomplicated: Secondary | ICD-10-CM | POA: Insufficient documentation

## 2015-07-13 ENCOUNTER — Telehealth: Payer: Self-pay

## 2015-07-13 ENCOUNTER — Ambulatory Visit: Payer: 59 | Admitting: Cardiothoracic Surgery

## 2015-07-13 ENCOUNTER — Ambulatory Visit
Admission: RE | Admit: 2015-07-13 | Discharge: 2015-07-13 | Disposition: A | Discharge status: Home / Self Care | Payer: 59 | Source: Ambulatory Visit | Attending: Cardiothoracic Surgery | Admitting: Cardiothoracic Surgery

## 2015-07-13 DIAGNOSIS — J439 Emphysema, unspecified: Secondary | ICD-10-CM | POA: Insufficient documentation

## 2015-07-13 DIAGNOSIS — R918 Other nonspecific abnormal finding of lung field: Secondary | ICD-10-CM | POA: Diagnosis not present

## 2015-07-13 DIAGNOSIS — I2510 Atherosclerotic heart disease of native coronary artery without angina pectoris: Secondary | ICD-10-CM | POA: Diagnosis not present

## 2015-07-13 DIAGNOSIS — R911 Solitary pulmonary nodule: Secondary | ICD-10-CM | POA: Insufficient documentation

## 2015-07-13 NOTE — Telephone Encounter (Signed)
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  Results provided to Dr Tonita CongWoodham and will be addressed with Dr.Oaks upon his return on 07/17/15.

## 2015-07-17 DIAGNOSIS — J449 Chronic obstructive pulmonary disease, unspecified: Secondary | ICD-10-CM | POA: Diagnosis not present

## 2015-07-18 ENCOUNTER — Telehealth: Payer: Self-pay | Admitting: Cardiothoracic Surgery

## 2015-07-18 NOTE — Telephone Encounter (Signed)
Dr Thelma Bargeaks called and states patient needs an appointment to discuss his CT results. Attempted to call patient to schedule same. No answer, I left a message and will call him again this afternoon.

## 2015-07-20 ENCOUNTER — Encounter: Payer: Self-pay | Admitting: Cardiothoracic Surgery

## 2015-07-20 ENCOUNTER — Ambulatory Visit (INDEPENDENT_AMBULATORY_CARE_PROVIDER_SITE_OTHER): Payer: 59 | Admitting: Cardiothoracic Surgery

## 2015-07-20 ENCOUNTER — Other Ambulatory Visit
Admission: RE | Admit: 2015-07-20 | Discharge: 2015-07-20 | Disposition: A | Discharge status: Home / Self Care | Payer: 59 | Source: Ambulatory Visit | Attending: Cardiothoracic Surgery | Admitting: Cardiothoracic Surgery

## 2015-07-20 VITALS — BP 137/87 | HR 75 | Temp 97.9°F | Ht 69.0 in | Wt 181.4 lb

## 2015-07-20 DIAGNOSIS — R918 Other nonspecific abnormal finding of lung field: Secondary | ICD-10-CM | POA: Insufficient documentation

## 2015-07-20 LAB — COMPREHENSIVE METABOLIC PANEL
ALK PHOS: 55 U/L (ref 38–126)
ALT: 38 U/L (ref 17–63)
ANION GAP: 7 (ref 5–15)
AST: 23 U/L (ref 15–41)
Albumin: 4.5 g/dL (ref 3.5–5.0)
BILIRUBIN TOTAL: 1.1 mg/dL (ref 0.3–1.2)
BUN: 13 mg/dL (ref 6–20)
CALCIUM: 9.4 mg/dL (ref 8.9–10.3)
CO2: 23 mmol/L (ref 22–32)
Chloride: 108 mmol/L (ref 101–111)
Creatinine, Ser: 0.75 mg/dL (ref 0.61–1.24)
Glucose, Bld: 96 mg/dL (ref 65–99)
POTASSIUM: 4.2 mmol/L (ref 3.5–5.1)
Sodium: 138 mmol/L (ref 135–145)
Total Protein: 7.3 g/dL (ref 6.5–8.1)

## 2015-07-20 LAB — CBC WITH DIFFERENTIAL/PLATELET
BASOS PCT: 1 %
Basophils Absolute: 0.1 10*3/uL (ref 0–0.1)
Eosinophils Absolute: 0.5 10*3/uL (ref 0–0.7)
Eosinophils Relative: 7 %
HEMATOCRIT: 48.5 % (ref 40.0–52.0)
HEMOGLOBIN: 16.9 g/dL (ref 13.0–18.0)
LYMPHS ABS: 2.2 10*3/uL (ref 1.0–3.6)
Lymphocytes Relative: 31 %
MCH: 31.4 pg (ref 26.0–34.0)
MCHC: 34.7 g/dL (ref 32.0–36.0)
MCV: 90.3 fL (ref 80.0–100.0)
MONOS PCT: 10 %
Monocytes Absolute: 0.7 10*3/uL (ref 0.2–1.0)
NEUTROS ABS: 3.6 10*3/uL (ref 1.4–6.5)
NEUTROS PCT: 51 %
Platelets: 212 10*3/uL (ref 150–440)
RBC: 5.38 MIL/uL (ref 4.40–5.90)
RDW: 12.5 % (ref 11.5–14.5)
WBC: 7.1 10*3/uL (ref 3.8–10.6)

## 2015-07-20 LAB — APTT: APTT: 27 s (ref 24–36)

## 2015-07-20 LAB — PROTIME-INR
INR: 0.98
PROTHROMBIN TIME: 13.2 s (ref 11.4–15.0)

## 2015-07-20 NOTE — Progress Notes (Signed)
[facility] Inpatient Post-Op Note  Patient ID: Jimmy CurbJeffrey Lynn Howell, male   DOB: 03/26/1970, 45 y.o.   MRN: [id number]  HISTORY: This patient returns today in follow-up. He did have a repeat CT scan. The CT scan was independently reviewed by me and discussed with the patient and his wife. This shows an enlarging right upper lobe mass. He did stop tobacco products about a week ago. He states he continues to get short of breath and is unable to walk more than 2 flights of stairs without having to stop and catch his breath. He has not noticed any significant improvement since his tobacco cessation but it has only been a few days. He denied any fevers or chills.   Filed Vitals:   07/20/15 0858  BP: 137/87  Pulse: 75  Temp: 97.9 F (36.6 C)     EXAM: Resp: Lungs are clear bilaterally.  No respiratory distress, normal effort. Heart:  Regular without murmurs Abd:  Abdomen is soft, non distended and non tender. No masses are palpable.  There is no rebound and no guarding.  Neurological: Alert and oriented to person, place, and time. Coordination normal.  Skin: Skin is warm and dry. No rash noted. No diaphoretic. No erythema. No pallor.  Psychiatric: Normal mood and affect. Normal behavior. Judgment and thought content normal.    ASSESSMENT: I have independently reviewed the patient's CT scan. The right upper lobe nodule is enlarged from January and April. 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 I also reviewed with him his pulmonary function studies from April. His FEV1 was 54% of predicted and his DLCO was normal. I did explain to him the indications and risks of surgical resection. We also had an extensive discussion about the potential role for a percutaneous biopsy. We discussed radiation therapy and its advantages and disadvantages compared to surgery.   PLAN:   After extensive discussion the patient would like to discuss his management with his wife. They will think about their  options and be back in touch. In the interim we will set up a percutaneous lung biopsy.    Hulda Marinimothy Esma Kilts, MD

## 2015-07-20 NOTE — Addendum Note (Signed)
Addended by: Adela PortsBONICHE, Shanine Kreiger on: 07/20/2015 02:51 PM   Modules accepted: Orders

## 2015-07-20 NOTE — Patient Instructions (Signed)
Today we have discussed surgery for your Lung Nodule, I will contact you as soon as your CT Guided biopsy has been scheduled. If you change your mind, please call Shaquon Gropp and we will discuss an alternate plan of treatment.  You will need to go to the Registration desk in the Medical Mall right after your appointment today to have Labs completed.  Directions to Medical Mall: When leaving our office, go right. Go all of the way down to the very end of the hallway. You will have a purple wall in front of you. You will now have a tunnel to the hospital on your left hand side. Go through this tunnel and the elevators will be on your left. Go down to the 1st floor and take a slight left. The very first desk on the right hand side is the registration desk.

## 2015-07-24 ENCOUNTER — Telehealth: Payer: Self-pay | Admitting: Cardiothoracic Surgery

## 2015-07-24 NOTE — Telephone Encounter (Signed)
Jayme Cloudalled Cindy, patient's wife and told her that I had spoken to Dr. Thelma Bargeaks about their decision. I told Arline AspCindy that we will see her husband on Friday 07/27/2015 at 10:00 AM to discuss his surgery. Cindy agreed.

## 2015-07-24 NOTE — Telephone Encounter (Signed)
Message was sent on 07/02/15, do not see any further documentation, Misty, please advise if this has been taken care of. Thanks.

## 2015-07-24 NOTE — Telephone Encounter (Signed)
Patient has decided that they do not want to go through with needle biopsy, but would like to go ahead and move forward with surgery. Please call patients wife, Jimmy Howell [phone] regarding this. Also, his next appointment with Dr Thelma Bargeaks is next Friday July 28th. Since they want to move forward with surgery, should they wait until next Friday or see Dr Thelma Bargeaks at an earlier time?

## 2015-07-24 NOTE — Telephone Encounter (Signed)
DK already has spoken with wife in regards to this message. Will close phone note.

## 2015-07-27 ENCOUNTER — Telehealth: Payer: Self-pay | Admitting: Cardiothoracic Surgery

## 2015-07-27 ENCOUNTER — Encounter: Payer: Self-pay | Admitting: Cardiothoracic Surgery

## 2015-07-27 ENCOUNTER — Other Ambulatory Visit
Admission: RE | Admit: 2015-07-27 | Discharge: 2015-07-27 | Disposition: A | Discharge status: Home / Self Care | Payer: 59 | Source: Ambulatory Visit | Attending: Cardiothoracic Surgery | Admitting: Cardiothoracic Surgery

## 2015-07-27 ENCOUNTER — Ambulatory Visit
Admission: RE | Admit: 2015-07-27 | Discharge: 2015-07-27 | Disposition: A | Discharge status: Home / Self Care | Payer: 59 | Source: Ambulatory Visit | Attending: Cardiothoracic Surgery | Admitting: Cardiothoracic Surgery

## 2015-07-27 ENCOUNTER — Ambulatory Visit (INDEPENDENT_AMBULATORY_CARE_PROVIDER_SITE_OTHER): Payer: 59 | Admitting: Cardiothoracic Surgery

## 2015-07-27 ENCOUNTER — Other Ambulatory Visit: Payer: Self-pay

## 2015-07-27 VITALS — BP 154/99 | HR 77 | Temp 97.8°F | Wt 179.0 lb

## 2015-07-27 DIAGNOSIS — R911 Solitary pulmonary nodule: Secondary | ICD-10-CM | POA: Insufficient documentation

## 2015-07-27 DIAGNOSIS — R918 Other nonspecific abnormal finding of lung field: Secondary | ICD-10-CM | POA: Insufficient documentation

## 2015-07-27 DIAGNOSIS — Z01818 Encounter for other preprocedural examination: Secondary | ICD-10-CM | POA: Diagnosis not present

## 2015-07-27 LAB — CBC WITH DIFFERENTIAL/PLATELET
BASOS PCT: 1 %
Basophils Absolute: 0.1 10*3/uL (ref 0–0.1)
EOS ABS: 0.4 10*3/uL (ref 0–0.7)
EOS PCT: 5 %
HCT: 49.2 % (ref 40.0–52.0)
HEMOGLOBIN: 17.2 g/dL (ref 13.0–18.0)
Lymphocytes Relative: 27 %
Lymphs Abs: 2.2 10*3/uL (ref 1.0–3.6)
MCH: 31.6 pg (ref 26.0–34.0)
MCHC: 34.9 g/dL (ref 32.0–36.0)
MCV: 90.5 fL (ref 80.0–100.0)
MONOS PCT: 10 %
Monocytes Absolute: 0.8 10*3/uL (ref 0.2–1.0)
NEUTROS PCT: 57 %
Neutro Abs: 4.5 10*3/uL (ref 1.4–6.5)
PLATELETS: 220 10*3/uL (ref 150–440)
RBC: 5.43 MIL/uL (ref 4.40–5.90)
RDW: 12.8 % (ref 11.5–14.5)
WBC: 8 10*3/uL (ref 3.8–10.6)

## 2015-07-27 LAB — COMPREHENSIVE METABOLIC PANEL
ALBUMIN: 4.6 g/dL (ref 3.5–5.0)
ALK PHOS: 56 U/L (ref 38–126)
ALT: 51 U/L (ref 17–63)
ANION GAP: 8 (ref 5–15)
AST: 28 U/L (ref 15–41)
BUN: 13 mg/dL (ref 6–20)
CALCIUM: 9.2 mg/dL (ref 8.9–10.3)
CHLORIDE: 106 mmol/L (ref 101–111)
CO2: 24 mmol/L (ref 22–32)
Creatinine, Ser: 0.92 mg/dL (ref 0.61–1.24)
GFR calc Af Amer: >60 mL/min (ref >60)
GFR calc non Af Amer: >60 mL/min (ref >60)
GLUCOSE: 88 mg/dL (ref 65–99)
POTASSIUM: 3.8 mmol/L (ref 3.5–5.1)
SODIUM: 138 mmol/L (ref 135–145)
Total Bilirubin: 1.4 mg/dL — ABNORMAL HIGH (ref 0.3–1.2)
Total Protein: 7.4 g/dL (ref 6.5–8.1)

## 2015-07-27 LAB — PROTIME-INR
INR: 1.06
Prothrombin Time: 14 seconds (ref 11.4–15.0)

## 2015-07-27 LAB — APTT: APTT: 29 s (ref 24–36)

## 2015-07-27 NOTE — Patient Instructions (Addendum)
Please stop taking your Meloxicam as of today.  Your appointment for your Pulmonary Function Test will be on Tuesday 07/31/2015 at 9:30 AM but [facility]   I will contact you as soon as I have your appointment with Dr. Mariah MillingGollan (Cardiology).

## 2015-07-27 NOTE — Progress Notes (Signed)
[facility] Post-[facility]  Patient ID: Jimmy Howell, male   DOB: 12/07/1970, 45 y.o.   MRN: [id number]  HISTORY: He returns to the office today in follow-up. He was able to quit smoking. It is now been 2 weeks and he does feel that his breathing is improved. He occasionally gets short of breath with exertion. The patient did relate to me today that he had a sleep study performed several months ago which did not reveal any evidence of desaturations. He was prescribed oxygen therapy early on by Dr. Kosse but he no longer uses that. He denied any fevers or chills.   Filed Vitals:   07/27/15 0928  BP: 154/99  Pulse: 77  Temp: 97.8 F (36.6 C)     EXAM: Resp: Lungs are clear bilaterally.  No respiratory distress, normal effort. Heart:  Regular without murmurs Abd:  Abdomen is soft, non distended and non tender. No masses are palpable.  There is no rebound and no guarding.  Neurological: Alert and oriented to person, place, and time. Coordination normal.  Skin: Skin is warm and dry. No rash noted. No diaphoretic. No erythema. No pallor.  Psychiatric: Normal mood and affect. Normal behavior. Judgment and thought content normal.    ASSESSMENT: I have again reviewed with him the indications and risks of surgery. Risks of bleeding, infection, air leak and death were all reviewed. Discussed the need for his smoking cessation and repeating his pulmonary function test. We will have those performed. In addition he will see cardiology for preoperative clearance. I again reviewed with him the possibility of performing a lung biopsy but he would like proceed directly to surgery. He understands that this may be an infectious process and that surgery in retrospect may not have been indicated.   PLAN:   We will have him see our cardiologist and repeat his pulmonary function tests. We will set him up for surgery once those are back.    Trevin Gartrell, MD 

## 2015-07-27 NOTE — Telephone Encounter (Signed)
[facility]. Appointment has been made and patient's wife has been advised. Appointment date and time--July 28th @ 11:15am with Dr Darrold JunkerParaschos at [facility] [place] [facility].

## 2015-07-30 ENCOUNTER — Other Ambulatory Visit: Payer: Self-pay

## 2015-07-30 DIAGNOSIS — R918 Other nonspecific abnormal finding of lung field: Secondary | ICD-10-CM

## 2015-07-30 LAB — ANTIBODY SCREEN: ANTIBODY SCREEN: NEGATIVE

## 2015-07-31 ENCOUNTER — Ambulatory Visit (HOSPITAL_COMMUNITY): Admission: RE | Admit: 2015-07-31 | Payer: 59 | Source: Ambulatory Visit

## 2015-07-31 ENCOUNTER — Ambulatory Visit: Payer: 59 | Attending: Cardiothoracic Surgery

## 2015-07-31 DIAGNOSIS — R918 Other nonspecific abnormal finding of lung field: Secondary | ICD-10-CM

## 2015-07-31 LAB — BLOOD GAS, ARTERIAL
ACID-BASE EXCESS: 2 mmol/L (ref 0.0–3.0)
Allens test (pass/fail): POSITIVE — AB
Bicarbonate: 26.5 mEq/L (ref 21.0–28.0)
FIO2: 21
O2 SAT: 94.9 %
PCO2 ART: 40 mmHg (ref 32.0–48.0)
PH ART: 7.43 (ref 7.350–7.450)
Patient temperature: 37
pO2, Arterial: 73 mmHg — ABNORMAL LOW (ref 83.0–108.0)

## 2015-07-31 LAB — ABO/RH: ABO/RH(D): O POS

## 2015-07-31 MED ORDER — ALBUTEROL SULFATE (2.5 MG/3ML) 0.083% IN NEBU
2.5000 mg | INHALATION_SOLUTION | Freq: Once | RESPIRATORY_TRACT | Status: AC
  Administered 2015-07-31: 2.5 mg via RESPIRATORY_TRACT
  Filled 2015-07-31: qty 3

## 2015-07-31 NOTE — Telephone Encounter (Signed)
Pt advised of pre op date/time and sx date. Sx: 08/13/15 with Dr Thornton Dales Excell Seltzer assisting.--Pre operative bronchoscopy with thoracotomy and right upper lobectomy.  Pre op: 08/03/15 @ 8:15am--Office.   Patient made aware to call [phone], between 1-3:00pm the day before surgery, to find out what time to arrive.

## 2015-08-03 ENCOUNTER — Ambulatory Visit: Payer: Self-pay | Admitting: Cardiothoracic Surgery

## 2015-08-03 ENCOUNTER — Inpatient Hospital Stay: Admission: RE | Admit: 2015-08-03 | Payer: 59 | Source: Ambulatory Visit

## 2015-08-03 DIAGNOSIS — I2510 Atherosclerotic heart disease of native coronary artery without angina pectoris: Secondary | ICD-10-CM | POA: Insufficient documentation

## 2015-08-03 DIAGNOSIS — J410 Simple chronic bronchitis: Secondary | ICD-10-CM | POA: Diagnosis not present

## 2015-08-03 DIAGNOSIS — Z720 Tobacco use: Secondary | ICD-10-CM | POA: Diagnosis not present

## 2015-08-03 DIAGNOSIS — Z01810 Encounter for preprocedural cardiovascular examination: Secondary | ICD-10-CM | POA: Diagnosis not present

## 2015-08-08 ENCOUNTER — Inpatient Hospital Stay: Admission: RE | Admit: 2015-08-08 | Payer: 59 | Source: Ambulatory Visit

## 2015-08-08 DIAGNOSIS — Z01810 Encounter for preprocedural cardiovascular examination: Secondary | ICD-10-CM | POA: Diagnosis not present

## 2015-08-08 DIAGNOSIS — Z720 Tobacco use: Secondary | ICD-10-CM | POA: Diagnosis not present

## 2015-08-08 DIAGNOSIS — J410 Simple chronic bronchitis: Secondary | ICD-10-CM | POA: Diagnosis not present

## 2015-08-08 DIAGNOSIS — I2510 Atherosclerotic heart disease of native coronary artery without angina pectoris: Secondary | ICD-10-CM | POA: Diagnosis not present

## 2015-08-09 ENCOUNTER — Telehealth: Payer: Self-pay

## 2015-08-09 NOTE — Telephone Encounter (Signed)
We received cardiac clearance from Dr. Darrold Junker letting us know that patient is at low risk to have this surgery. I will fax a copy to Pre-Admit to let them know. Dr. Thelma Barge will also be informed.

## 2015-08-10 ENCOUNTER — Telehealth: Payer: Self-pay

## 2015-08-10 ENCOUNTER — Other Ambulatory Visit: Payer: 59

## 2015-08-10 ENCOUNTER — Ambulatory Visit: Payer: Self-pay | Admitting: Cardiothoracic Surgery

## 2015-08-10 NOTE — Telephone Encounter (Signed)
Called patient again and left a detailed message to let him know that he needs to go to the Medical Mall and have his nasal swab and Type Screen done prior to his surgery (08/20/2015).

## 2015-08-10 NOTE — Telephone Encounter (Signed)
Called patient to let him know that the lab forgot to do his nasal swab and Type and Screen.

## 2015-08-16 ENCOUNTER — Ambulatory Visit: Payer: 59 | Admitting: Cardiology

## 2015-08-17 ENCOUNTER — Encounter
Admission: RE | Admit: 2015-08-17 | Discharge: 2015-08-17 | Disposition: A | Discharge status: Home / Self Care | Payer: 59 | Source: Ambulatory Visit | Attending: Cardiothoracic Surgery | Admitting: Cardiothoracic Surgery

## 2015-08-17 ENCOUNTER — Other Ambulatory Visit: Payer: Self-pay

## 2015-08-17 ENCOUNTER — Telehealth: Payer: Self-pay

## 2015-08-17 DIAGNOSIS — J95811 Postprocedural pneumothorax: Secondary | ICD-10-CM | POA: Diagnosis not present

## 2015-08-17 DIAGNOSIS — I2510 Atherosclerotic heart disease of native coronary artery without angina pectoris: Secondary | ICD-10-CM | POA: Diagnosis not present

## 2015-08-17 DIAGNOSIS — J8410 Pulmonary fibrosis, unspecified: Secondary | ICD-10-CM | POA: Diagnosis not present

## 2015-08-17 DIAGNOSIS — J432 Centrilobular emphysema: Secondary | ICD-10-CM | POA: Diagnosis not present

## 2015-08-17 DIAGNOSIS — K219 Gastro-esophageal reflux disease without esophagitis: Secondary | ICD-10-CM | POA: Diagnosis not present

## 2015-08-17 DIAGNOSIS — J90 Pleural effusion, not elsewhere classified: Secondary | ICD-10-CM | POA: Diagnosis not present

## 2015-08-17 DIAGNOSIS — Z87891 Personal history of nicotine dependence: Secondary | ICD-10-CM | POA: Diagnosis not present

## 2015-08-17 DIAGNOSIS — R918 Other nonspecific abnormal finding of lung field: Secondary | ICD-10-CM

## 2015-08-17 DIAGNOSIS — G2581 Restless legs syndrome: Secondary | ICD-10-CM | POA: Diagnosis not present

## 2015-08-17 DIAGNOSIS — J449 Chronic obstructive pulmonary disease, unspecified: Secondary | ICD-10-CM | POA: Diagnosis not present

## 2015-08-17 HISTORY — DX: Restless legs syndrome: G25.81

## 2015-08-17 HISTORY — DX: Reserved for inherently not codable concepts without codable children: IMO0001

## 2015-08-17 LAB — SURGICAL PCR SCREEN
MRSA, PCR: NEGATIVE
STAPHYLOCOCCUS AUREUS: POSITIVE — AB

## 2015-08-17 MED ORDER — MUPIROCIN CALCIUM 2 % NA OINT: 1.0000 "application " | TOPICAL_OINTMENT | Freq: Two times a day (BID) | NASAL | 0 refills | Status: DC

## 2015-08-17 NOTE — Pre-Procedure Instructions (Signed)
Cardiac clearance on chart from Dr. Darrold JunkerParaschos, patient cleared at low risk.

## 2015-08-17 NOTE — Patient Instructions (Signed)
  Your procedure is scheduled on:August 20, 2015 Report to Same Day Surgery 2nd floor Medical Mall To find out your arrival time please call [phone] between 1PM - 3PM on August 17, 2015 (Friday)  Remember: Instructions that are not followed completely may result in serious medical risk, up to and including death, or upon the discretion of your surgeon and anesthesiologist your surgery may need to be rescheduled.    _x___ 1. Do not eat food or drink liquids after midnight. No gum chewing or hard candies.     _x___ 2. No Alcohol for 24 hours before or after surgery.   _x__3. No Smoking for 24 prior to surgery.   ____  4. Bring all medications with you on the day of surgery if instructed.    __x__ 5. Notify your doctor if there is any change in your medical condition     (cold, fever, infections).     Do not wear jewelry, make-up, hairpins, clips or nail polish.  Do not wear lotions, powders, or perfumes. You may wear deodorant.  Do not shave 48 hours prior to surgery. Men may shave face and neck.  Do not bring valuables to the hospital.    [facility] is not responsible for any belongings or valuables.               Contacts, dentures or bridgework may not be worn into surgery.  Leave your suitcase in the car. After surgery it may be brought to your room.  For patients admitted to the hospital, discharge time is determined by your treatment team.   Patients discharged the day of surgery will not be allowed to drive home.    Please read over the following fact sheets that you were given:   [facility] Preparing for Surgery and or MRSA Information   _x___ Take these medicines the morning of surgery with A SIP OF WATER:    1. Protonix  2.  3.  4.  5.  6.  ____ Fleet Enema (as directed)   ___ Use CHG Soap or sage wipes as directed on instruction sheet   __x__ Use inhalers on the day of surgery and bring to hospital day of surgery (Albuterol, Flovent, and Bevespi and  bring inhalers to hospital)  ____ Stop metformin 2 days prior to surgery    ____ Take 1/2 of usual insulin dose the night before surgery and none on the morning of sirgery            _x___ Stop aspirin or coumadin, or plavix (NO ASPIRIN)  _x__ Stop Anti-inflammatories such as Advil, Aleve, Ibuprofen, Motrin, Naproxen,          Naprosyn, Goodies powders or aspirin products. Ok to take Tylenol. (Patient has stopped Meloxicam)   ____ Stop supplements until after surgery.    ____ Bring C-Pap to the hospital.

## 2015-08-17 NOTE — Telephone Encounter (Signed)
Received positive STAPH result on PCR Surgical Screen. Spoke with Dr. Thelma Bargeaks in regards to this. He would like Bactroban called in for patient.  Bactroban sent to [facility] [place]  Call made to patient's wife at this time. Explained medication and usage of medication. She verbalizes understanding and has no further questions.

## 2015-08-17 NOTE — Telephone Encounter (Signed)
Hospital doctorAmber, RN spoke with Arline AspCindy (patient's wife) and informed her that Mr. Granja needed to go to the lab and have some labs done before his surgery. Arline AspCindy stated that she would tell her husband about it.

## 2015-08-20 ENCOUNTER — Encounter
Admission: RE | Disposition: A | Discharge status: Home / Self Care | Payer: Self-pay | Source: Ambulatory Visit | Attending: Cardiothoracic Surgery

## 2015-08-20 ENCOUNTER — Inpatient Hospital Stay: Payer: 59

## 2015-08-20 ENCOUNTER — Inpatient Hospital Stay: Payer: 59 | Admitting: Anesthesiology

## 2015-08-20 ENCOUNTER — Inpatient Hospital Stay
Admission: RE | Admit: 2015-08-20 | Discharge: 2015-08-27 | DRG: 164 | Disposition: A | Discharge status: Home / Self Care | Payer: 59 | Source: Ambulatory Visit | Attending: Cardiothoracic Surgery | Admitting: Cardiothoracic Surgery

## 2015-08-20 ENCOUNTER — Encounter: Payer: Self-pay | Admitting: *Deleted

## 2015-08-20 DIAGNOSIS — A310 Pulmonary mycobacterial infection: Secondary | ICD-10-CM | POA: Diagnosis not present

## 2015-08-20 DIAGNOSIS — J8410 Pulmonary fibrosis, unspecified: Secondary | ICD-10-CM | POA: Diagnosis not present

## 2015-08-20 DIAGNOSIS — J439 Emphysema, unspecified: Secondary | ICD-10-CM | POA: Diagnosis not present

## 2015-08-20 DIAGNOSIS — S270XXA Traumatic pneumothorax, initial encounter: Secondary | ICD-10-CM

## 2015-08-20 DIAGNOSIS — G2581 Restless legs syndrome: Secondary | ICD-10-CM | POA: Diagnosis present

## 2015-08-20 DIAGNOSIS — J984 Other disorders of lung: Secondary | ICD-10-CM | POA: Diagnosis not present

## 2015-08-20 DIAGNOSIS — Z87891 Personal history of nicotine dependence: Secondary | ICD-10-CM | POA: Diagnosis not present

## 2015-08-20 DIAGNOSIS — K219 Gastro-esophageal reflux disease without esophagitis: Secondary | ICD-10-CM | POA: Diagnosis present

## 2015-08-20 DIAGNOSIS — R911 Solitary pulmonary nodule: Secondary | ICD-10-CM | POA: Diagnosis not present

## 2015-08-20 DIAGNOSIS — J95811 Postprocedural pneumothorax: Secondary | ICD-10-CM | POA: Diagnosis not present

## 2015-08-20 DIAGNOSIS — Y838 Other surgical procedures as the cause of abnormal reaction of the patient, or of later complication, without mention of misadventure at the time of the procedure: Secondary | ICD-10-CM | POA: Diagnosis not present

## 2015-08-20 DIAGNOSIS — R918 Other nonspecific abnormal finding of lung field: Secondary | ICD-10-CM | POA: Diagnosis present

## 2015-08-20 DIAGNOSIS — I2510 Atherosclerotic heart disease of native coronary artery without angina pectoris: Secondary | ICD-10-CM | POA: Diagnosis not present

## 2015-08-20 DIAGNOSIS — J449 Chronic obstructive pulmonary disease, unspecified: Secondary | ICD-10-CM | POA: Diagnosis not present

## 2015-08-20 DIAGNOSIS — Z720 Tobacco use: Secondary | ICD-10-CM

## 2015-08-20 DIAGNOSIS — Z09 Encounter for follow-up examination after completed treatment for conditions other than malignant neoplasm: Secondary | ICD-10-CM

## 2015-08-20 DIAGNOSIS — Z9889 Other specified postprocedural states: Secondary | ICD-10-CM

## 2015-08-20 DIAGNOSIS — J9811 Atelectasis: Secondary | ICD-10-CM | POA: Diagnosis not present

## 2015-08-20 DIAGNOSIS — J90 Pleural effusion, not elsewhere classified: Secondary | ICD-10-CM

## 2015-08-20 DIAGNOSIS — J939 Pneumothorax, unspecified: Secondary | ICD-10-CM | POA: Diagnosis not present

## 2015-08-20 DIAGNOSIS — Z4682 Encounter for fitting and adjustment of non-vascular catheter: Secondary | ICD-10-CM | POA: Diagnosis not present

## 2015-08-20 DIAGNOSIS — J432 Centrilobular emphysema: Secondary | ICD-10-CM | POA: Diagnosis not present

## 2015-08-20 DIAGNOSIS — Z8249 Family history of ischemic heart disease and other diseases of the circulatory system: Secondary | ICD-10-CM

## 2015-08-20 DIAGNOSIS — G8912 Acute post-thoracotomy pain: Secondary | ICD-10-CM | POA: Diagnosis not present

## 2015-08-20 DIAGNOSIS — Z8052 Family history of malignant neoplasm of bladder: Secondary | ICD-10-CM

## 2015-08-20 DIAGNOSIS — F17210 Nicotine dependence, cigarettes, uncomplicated: Secondary | ICD-10-CM

## 2015-08-20 DIAGNOSIS — R071 Chest pain on breathing: Secondary | ICD-10-CM | POA: Diagnosis not present

## 2015-08-20 HISTORY — PX: VIDEO ASSISTED THORACOSCOPY (VATS)/THOROCOTOMY: SHX6173

## 2015-08-20 LAB — BASIC METABOLIC PANEL
Anion gap: 7 (ref 5–15)
BUN: 11 mg/dL (ref 6–20)
CHLORIDE: 101 mmol/L (ref 101–111)
CO2: 26 mmol/L (ref 22–32)
CREATININE: 0.62 mg/dL (ref 0.61–1.24)
Calcium: 8.5 mg/dL — ABNORMAL LOW (ref 8.9–10.3)
GFR calc Af Amer: >60 mL/min (ref >60)
GFR calc non Af Amer: >60 mL/min (ref >60)
GLUCOSE: 144 mg/dL — AB (ref 65–99)
POTASSIUM: 4.3 mmol/L (ref 3.5–5.1)
Sodium: 134 mmol/L — ABNORMAL LOW (ref 135–145)

## 2015-08-20 LAB — CBC
HCT: 46.7 % (ref 40.0–52.0)
HEMOGLOBIN: 16 g/dL (ref 13.0–18.0)
MCH: 31.1 pg (ref 26.0–34.0)
MCHC: 34.4 g/dL (ref 32.0–36.0)
MCV: 90.5 fL (ref 80.0–100.0)
Platelets: 197 10*3/uL (ref 150–440)
RBC: 5.16 MIL/uL (ref 4.40–5.90)
RDW: 12.7 % (ref 11.5–14.5)
WBC: 16.2 10*3/uL — AB (ref 3.8–10.6)

## 2015-08-20 LAB — PREPARE RBC (CROSSMATCH)

## 2015-08-20 LAB — GLUCOSE, CAPILLARY: Glucose-Capillary: 167 mg/dL — ABNORMAL HIGH (ref 65–99)

## 2015-08-20 SURGERY — VIDEO ASSISTED THORACOSCOPY (VATS)/THOROCOTOMY
Anesthesia: General | Laterality: Right | Wound class: Clean Contaminated

## 2015-08-20 MED ORDER — NALOXONE HCL 0.4 MG/ML IJ SOLN: 0.4000 mg | INTRAMUSCULAR | Status: DC | PRN

## 2015-08-20 MED ORDER — LACTATED RINGERS IV SOLN
INTRAVENOUS | Status: DC
  Administered 2015-08-20 (×2): via INTRAVENOUS

## 2015-08-20 MED ORDER — ACETAMINOPHEN 10 MG/ML IV SOLN
INTRAVENOUS | Status: DC | PRN
  Administered 2015-08-20: 1000 mg via INTRAVENOUS

## 2015-08-20 MED ORDER — NALBUPHINE HCL 10 MG/ML IJ SOLN
5.0000 mg | Freq: Once | INTRAMUSCULAR | Status: DC | PRN
  Filled 2015-08-20: qty 0.5

## 2015-08-20 MED ORDER — DIPHENHYDRAMINE HCL 50 MG/ML IJ SOLN: 12.5000 mg | INTRAMUSCULAR | Status: DC | PRN

## 2015-08-20 MED ORDER — ROCURONIUM BROMIDE 100 MG/10ML IV SOLN
INTRAVENOUS | Status: DC | PRN
  Administered 2015-08-20: 10 mg via INTRAVENOUS
  Administered 2015-08-20: 20 mg via INTRAVENOUS
  Administered 2015-08-20 (×4): 10 mg via INTRAVENOUS
  Administered 2015-08-20: 40 mg via INTRAVENOUS
  Administered 2015-08-20: 10 mg via INTRAVENOUS
  Administered 2015-08-20 (×3): 20 mg via INTRAVENOUS
  Administered 2015-08-20: 10 mg via INTRAVENOUS

## 2015-08-20 MED ORDER — FENTANYL 2.5 MCG/ML W/ROPIVACAINE 0.2% IN NS 100 ML EPIDURAL INFUSION (ARMC-ANES)
EPIDURAL | Status: AC
  Administered 2015-08-20: 6 mL/h via EPIDURAL
  Filled 2015-08-20: qty 100

## 2015-08-20 MED ORDER — FENTANYL 2.5 MCG/ML W/ROPIVACAINE 0.2% IN NS 100 ML EPIDURAL INFUSION (ARMC-ANES)
8.0000 mL/h | EPIDURAL | Status: DC
  Administered 2015-08-20: 6 mL/h via EPIDURAL
  Administered 2015-08-20: 8 mL/h via EPIDURAL
  Filled 2015-08-20: qty 100

## 2015-08-20 MED ORDER — DEXTROSE 5 % IV SOLN
1.5000 g | INTRAVENOUS | Status: AC
  Administered 2015-08-20: 1.5 g via INTRAVENOUS
  Filled 2015-08-20: qty 1.5

## 2015-08-20 MED ORDER — ACETAMINOPHEN 10 MG/ML IV SOLN
INTRAVENOUS | Status: AC
  Filled 2015-08-20: qty 100

## 2015-08-20 MED ORDER — FENTANYL CITRATE (PF) 100 MCG/2ML IJ SOLN
INTRAMUSCULAR | Status: AC
  Administered 2015-08-20: 25 ug via INTRAVENOUS
  Filled 2015-08-20: qty 2

## 2015-08-20 MED ORDER — ONDANSETRON HCL 4 MG/2ML IJ SOLN: 4.0000 mg | Freq: Four times a day (QID) | INTRAMUSCULAR | Status: DC | PRN

## 2015-08-20 MED ORDER — SUCCINYLCHOLINE CHLORIDE 20 MG/ML IJ SOLN
INTRAMUSCULAR | Status: DC | PRN
  Administered 2015-08-20: 120 mg via INTRAVENOUS

## 2015-08-20 MED ORDER — HYDROMORPHONE HCL 1 MG/ML IJ SOLN
0.5000 mg | INTRAMUSCULAR | Status: DC | PRN
  Administered 2015-08-20 (×2): 0.5 mg via INTRAVENOUS

## 2015-08-20 MED ORDER — PHENYLEPHRINE HCL 10 MG/ML IJ SOLN
INTRAMUSCULAR | Status: DC | PRN
  Administered 2015-08-20: 100 ug via INTRAVENOUS

## 2015-08-20 MED ORDER — FENTANYL CITRATE (PF) 100 MCG/2ML IJ SOLN
INTRAMUSCULAR | Status: DC | PRN
Start: 2015-08-20 — End: 2015-08-20
  Administered 2015-08-20 (×5): 50 ug via INTRAVENOUS

## 2015-08-20 MED ORDER — TRAMADOL HCL 50 MG PO TABS
50.0000 mg | ORAL_TABLET | Freq: Four times a day (QID) | ORAL | Status: DC
  Administered 2015-08-20 – 2015-08-21 (×3): 50 mg via ORAL
  Filled 2015-08-20 (×3): qty 1

## 2015-08-20 MED ORDER — BUPROPION HCL 75 MG PO TABS
75.0000 mg | ORAL_TABLET | Freq: Two times a day (BID) | ORAL | Status: DC
  Administered 2015-08-20 – 2015-08-27 (×13): 75 mg via ORAL
  Filled 2015-08-20 (×15): qty 1

## 2015-08-20 MED ORDER — PANTOPRAZOLE SODIUM 40 MG PO TBEC
40.0000 mg | DELAYED_RELEASE_TABLET | Freq: Every day | ORAL | Status: DC
  Administered 2015-08-21 – 2015-08-27 (×7): 40 mg via ORAL
  Filled 2015-08-20 (×7): qty 1

## 2015-08-20 MED ORDER — DEXTROSE 5 % IV SOLN
1.5000 g | Freq: Three times a day (TID) | INTRAVENOUS | Status: DC
  Administered 2015-08-20: 1.5 g via INTRAVENOUS
  Filled 2015-08-20 (×4): qty 1.5

## 2015-08-20 MED ORDER — SODIUM CHLORIDE 0.9% FLUSH: 3.0000 mL | INTRAVENOUS | Status: DC | PRN

## 2015-08-20 MED ORDER — CETYLPYRIDINIUM CHLORIDE 0.05 % MT LIQD
7.0000 mL | Freq: Two times a day (BID) | OROMUCOSAL | Status: DC
  Administered 2015-08-20 – 2015-08-25 (×4): 7 mL via OROMUCOSAL

## 2015-08-20 MED ORDER — FAMOTIDINE IN NACL 20-0.9 MG/50ML-% IV SOLN
20.0000 mg | Freq: Two times a day (BID) | INTRAVENOUS | Status: DC
  Administered 2015-08-20 (×2): 20 mg via INTRAVENOUS
  Filled 2015-08-20 (×3): qty 50

## 2015-08-20 MED ORDER — DEXTROSE-NACL 5-0.45 % IV SOLN
INTRAVENOUS | Status: DC
  Administered 2015-08-20 – 2015-08-22 (×4): via INTRAVENOUS

## 2015-08-20 MED ORDER — LACTATED RINGERS IV SOLN
INTRAVENOUS | Status: DC
  Administered 2015-08-20 (×3): via INTRAVENOUS

## 2015-08-20 MED ORDER — NALBUPHINE HCL 10 MG/ML IJ SOLN
5.0000 mg | INTRAMUSCULAR | Status: DC | PRN
  Filled 2015-08-20: qty 0.5

## 2015-08-20 MED ORDER — MEPERIDINE HCL 25 MG/ML IJ SOLN: 6.2500 mg | INTRAMUSCULAR | Status: DC | PRN

## 2015-08-20 MED ORDER — SODIUM CHLORIDE 0.9 % IV SOLN
INTRAVENOUS | Status: DC | PRN
  Administered 2015-08-20: 70 mL

## 2015-08-20 MED ORDER — CEFUROXIME SODIUM 1.5 G IJ SOLR
1.5000 g | Freq: Three times a day (TID) | INTRAMUSCULAR | Status: DC
  Filled 2015-08-20 (×4): qty 1.5

## 2015-08-20 MED ORDER — HYDROMORPHONE HCL 1 MG/ML IJ SOLN
INTRAMUSCULAR | Status: AC
  Administered 2015-08-20: 0.5 mg via INTRAVENOUS
  Filled 2015-08-20: qty 1

## 2015-08-20 MED ORDER — LIDOCAINE HCL (CARDIAC) 20 MG/ML IV SOLN
INTRAVENOUS | Status: DC | PRN
  Administered 2015-08-20: 100 mg via INTRAVENOUS

## 2015-08-20 MED ORDER — MIDAZOLAM HCL 2 MG/2ML IJ SOLN
INTRAMUSCULAR | Status: DC | PRN
  Administered 2015-08-20: 3 mg via INTRAVENOUS
  Administered 2015-08-20: 2 mg via INTRAVENOUS

## 2015-08-20 MED ORDER — BUPIVACAINE HCL (PF) 0.25 % IJ SOLN
INTRAMUSCULAR | Status: DC | PRN
  Administered 2015-08-20: 5 mL via EPIDURAL
  Administered 2015-08-20 (×4): 2 mL via EPIDURAL
  Administered 2015-08-20: 3 mL via EPIDURAL
  Administered 2015-08-20 (×2): 2 mL via EPIDURAL
  Administered 2015-08-20: 3 mL via EPIDURAL
  Administered 2015-08-20: 2 mL via EPIDURAL

## 2015-08-20 MED ORDER — ONDANSETRON HCL 4 MG/2ML IJ SOLN
INTRAMUSCULAR | Status: DC | PRN
  Administered 2015-08-20 (×2): 4 mg via INTRAVENOUS

## 2015-08-20 MED ORDER — IPRATROPIUM-ALBUTEROL 0.5-2.5 (3) MG/3ML IN SOLN
3.0000 mL | Freq: Three times a day (TID) | RESPIRATORY_TRACT | Status: DC
  Administered 2015-08-20: 3 mL via RESPIRATORY_TRACT
  Filled 2015-08-20: qty 3

## 2015-08-20 MED ORDER — CEFUROXIME SODIUM 1.5 G IJ SOLR
1.5000 g | Freq: Two times a day (BID) | INTRAMUSCULAR | Status: DC
  Administered 2015-08-21: 1.5 g via INTRAVENOUS
  Filled 2015-08-20 (×2): qty 1.5

## 2015-08-20 MED ORDER — MUPIROCIN CALCIUM 2 % NA OINT
1.0000 "application " | TOPICAL_OINTMENT | Freq: Two times a day (BID) | NASAL | Status: DC
  Administered 2015-08-20 – 2015-08-23 (×4): 1 via NASAL
  Filled 2015-08-20 (×8): qty 1

## 2015-08-20 MED ORDER — PROMETHAZINE HCL 25 MG/ML IJ SOLN: 6.2500 mg | INTRAMUSCULAR | Status: DC | PRN

## 2015-08-20 MED ORDER — DIPHENHYDRAMINE HCL 25 MG PO CAPS: 25.0000 mg | ORAL_CAPSULE | ORAL | Status: DC | PRN

## 2015-08-20 MED ORDER — FENTANYL CITRATE (PF) 100 MCG/2ML IJ SOLN
25.0000 ug | INTRAMUSCULAR | Status: AC | PRN
  Administered 2015-08-20 (×6): 25 ug via INTRAVENOUS

## 2015-08-20 MED ORDER — PROPOFOL 10 MG/ML IV BOLUS
INTRAVENOUS | Status: DC | PRN
  Administered 2015-08-20: 200 mg via INTRAVENOUS

## 2015-08-20 MED ORDER — ONDANSETRON HCL 4 MG/2ML IJ SOLN: 4.0000 mg | Freq: Three times a day (TID) | INTRAMUSCULAR | Status: DC | PRN

## 2015-08-20 MED ORDER — NICOTINE 21 MG/24HR TD PT24
21.0000 mg | MEDICATED_PATCH | Freq: Every day | TRANSDERMAL | Status: DC
  Administered 2015-08-20 – 2015-08-27 (×8): 21 mg via TRANSDERMAL
  Filled 2015-08-20 (×8): qty 1

## 2015-08-20 MED ORDER — DEXAMETHASONE SODIUM PHOSPHATE 10 MG/ML IJ SOLN
INTRAMUSCULAR | Status: DC | PRN
  Administered 2015-08-20: 10 mg via INTRAVENOUS

## 2015-08-20 MED ORDER — SODIUM CHLORIDE FLUSH 0.9 % IV SOLN
INTRAVENOUS | Status: AC
  Filled 2015-08-20: qty 10

## 2015-08-20 MED ORDER — BISACODYL 5 MG PO TBEC
10.0000 mg | DELAYED_RELEASE_TABLET | Freq: Every day | ORAL | Status: DC
  Administered 2015-08-21 – 2015-08-25 (×5): 10 mg via ORAL
  Filled 2015-08-20 (×6): qty 2

## 2015-08-20 MED ORDER — ALBUTEROL SULFATE (2.5 MG/3ML) 0.083% IN NEBU
2.5000 mg | INHALATION_SOLUTION | RESPIRATORY_TRACT | Status: DC
  Administered 2015-08-20 – 2015-08-21 (×2): 2.5 mg via RESPIRATORY_TRACT
  Filled 2015-08-20 (×2): qty 3

## 2015-08-20 MED ORDER — NALOXONE HCL 2 MG/2ML IJ SOSY
1.0000 ug/kg/h | PREFILLED_SYRINGE | INTRAVENOUS | Status: DC | PRN
  Filled 2015-08-20: qty 2

## 2015-08-20 MED ORDER — MORPHINE SULFATE (PF) 2 MG/ML IV SOLN
2.0000 mg | INTRAVENOUS | Status: DC | PRN
  Administered 2015-08-20 – 2015-08-21 (×7): 2 mg via INTRAVENOUS
  Filled 2015-08-20 (×7): qty 1

## 2015-08-20 SURGICAL SUPPLY — 76 items
BENZOIN TINCTURE PRP APPL 2/3 (GAUZE/BANDAGES/DRESSINGS) ×2 IMPLANT
BNDG COHESIVE 4X5 TAN STRL (GAUZE/BANDAGES/DRESSINGS) IMPLANT
BRONCHOSCOPE PED SLIM DISP (MISCELLANEOUS) ×2 IMPLANT
CANISTER SUCT 1200ML W/VALVE (MISCELLANEOUS) ×6 IMPLANT
CANISTER SUCT 3000ML PPV (MISCELLANEOUS) ×4 IMPLANT
CATH TRAY 16F METER LATEX (MISCELLANEOUS) ×2 IMPLANT
CATH URET ROBINSON 16FR STRL (CATHETERS) ×2 IMPLANT
CHLORAPREP W/TINT 26ML (MISCELLANEOUS) ×4 IMPLANT
CLIP TI WIDE RED SMALL 6 (CLIP) ×2 IMPLANT
CNTNR SPEC 2.5X3XGRAD LEK (MISCELLANEOUS)
CONT SPEC 4OZ STER OR WHT (MISCELLANEOUS)
CONTAINER SPEC 2.5X3XGRAD LEK (MISCELLANEOUS) IMPLANT
CUTTER ECHEON FLEX ENDO 45 340 (ENDOMECHANICALS) ×2 IMPLANT
DEFOGGER SCOPE WARMER CLEARIFY (MISCELLANEOUS) ×2 IMPLANT
DRAIN CHEST DRY SUCT SGL (MISCELLANEOUS) ×2 IMPLANT
DRAPE C-SECTION (MISCELLANEOUS) ×2 IMPLANT
DRAPE MAG INST 16X20 L/F (DRAPES) ×2 IMPLANT
DRSG OPSITE POSTOP 3X4 (GAUZE/BANDAGES/DRESSINGS) ×2 IMPLANT
DRSG OPSITE POSTOP 4X10 (GAUZE/BANDAGES/DRESSINGS) ×2 IMPLANT
DRSG OPSITE POSTOP 4X6 (GAUZE/BANDAGES/DRESSINGS) IMPLANT
DRSG OPSITE POSTOP 4X8 (GAUZE/BANDAGES/DRESSINGS) IMPLANT
DRSG TELFA 3X8 NADH (GAUZE/BANDAGES/DRESSINGS) ×2 IMPLANT
ELECT BLADE 6.5 EXT (BLADE) ×2 IMPLANT
ELECT CAUTERY BLADE TIP 2.5 (TIP) ×2
ELECT REM PT RETURN 9FT ADLT (ELECTROSURGICAL) ×2
ELECTRODE CAUTERY BLDE TIP 2.5 (TIP) ×1 IMPLANT
ELECTRODE REM PT RTRN 9FT ADLT (ELECTROSURGICAL) ×1 IMPLANT
GAUZE SPONGE 4X4 12PLY STRL (GAUZE/BANDAGES/DRESSINGS) ×4 IMPLANT
GLOVE SURG SYN 7.5  E (GLOVE) ×6
GLOVE SURG SYN 7.5 E (GLOVE) ×6 IMPLANT
GOWN STRL REUS W/ TWL LRG LVL3 (GOWN DISPOSABLE) ×5 IMPLANT
GOWN STRL REUS W/TWL LRG LVL3 (GOWN DISPOSABLE) ×5
KIT RM TURNOVER STRD PROC AR (KITS) ×2 IMPLANT
LABEL OR SOLS (LABEL) ×2 IMPLANT
LOOP RED MAXI  1X406MM (MISCELLANEOUS) ×1
LOOP VESSEL MAXI 1X406 RED (MISCELLANEOUS) ×1 IMPLANT
MARKER SKIN DUAL TIP RULER LAB (MISCELLANEOUS) ×2 IMPLANT
NEEDLE SPNL 22GX3.5 QUINCKE BK (NEEDLE) ×2 IMPLANT
PACK BASIN MAJOR ARMC (MISCELLANEOUS) ×2 IMPLANT
REDUCER CONN 3/8X1/4X1/4IN (MISCELLANEOUS) ×2 IMPLANT
RELOAD GOLD ECHELON 45 (STAPLE) ×16 IMPLANT
RELOAD STAPLER LINE PROX 30 GR (STAPLE) ×1 IMPLANT
SPONGE KITTNER 5P (MISCELLANEOUS) ×4 IMPLANT
STAPLE RELOAD 2.5MM WHITE (STAPLE) ×10 IMPLANT
STAPLER RELOAD LINE PROX 30 GR (STAPLE) ×2
STAPLER SKIN PROX 35W (STAPLE) ×2 IMPLANT
STAPLER VASCULAR ECHELON 35 (CUTTER) ×2 IMPLANT
STRIP CLOSURE SKIN 1/2X4 (GAUZE/BANDAGES/DRESSINGS) ×2 IMPLANT
SUT ETHILON 4-0 (SUTURE) ×1
SUT ETHILON 4-0 FS2 18XMFL BLK (SUTURE) ×1
SUT MNCRL AB 3-0 PS2 27 (SUTURE) IMPLANT
SUT PROLENE 4 0 RB 1 (SUTURE) ×6
SUT PROLENE 4 0 SH DA (SUTURE) ×2 IMPLANT
SUT PROLENE 4-0 RB1 .5 CRCL 36 (SUTURE) ×6 IMPLANT
SUT SILK 0 (SUTURE) ×1
SUT SILK 0 30XBRD TIE 6 (SUTURE) ×1 IMPLANT
SUT SILK 1 SH (SUTURE) ×14 IMPLANT
SUT SILK 2 0 (SUTURE) ×2
SUT SILK 2-0 18XBRD TIE 12 (SUTURE) ×1 IMPLANT
SUT SILK 2-0 30XBRD TIE 12 (SUTURE) ×1 IMPLANT
SUT VIC AB 0 CT1 36 (SUTURE) ×4 IMPLANT
SUT VIC AB 2-0 CT1 27 (SUTURE) ×2
SUT VIC AB 2-0 CT1 TAPERPNT 27 (SUTURE) ×2 IMPLANT
SUT VIC AB 3-0 SH 27 (SUTURE) ×1
SUT VIC AB 3-0 SH 27X BRD (SUTURE) ×1 IMPLANT
SUT VICRYL 2 TP 1 (SUTURE) ×6 IMPLANT
SUTURE ETHLN 4-0 FS2 18XMF BLK (SUTURE) ×1 IMPLANT
SYR 10ML SLIP (SYRINGE) IMPLANT
SYR BULB IRRIG 60ML STRL (SYRINGE) ×2 IMPLANT
TAPE ADH 3 LX (MISCELLANEOUS) ×2 IMPLANT
TAPE TRANSPORE STRL 2 31045 (GAUZE/BANDAGES/DRESSINGS) IMPLANT
TROCAR FLEXIPATH 20X80 (ENDOMECHANICALS) ×2 IMPLANT
TROCAR FLEXIPATH THORACIC 15MM (ENDOMECHANICALS) IMPLANT
TUBING CONNECTING 10 (TUBING) ×2 IMPLANT
WATER STERILE IRR 1000ML POUR (IV SOLUTION) ×10 IMPLANT
YANKAUER SUCT BULB TIP FLEX NO (MISCELLANEOUS) ×4 IMPLANT

## 2015-08-20 NOTE — Anesthesia Procedure Notes (Signed)
Epidural Patient location during procedure: OR Start time: 08/20/2015 7:43 AM End time: 08/20/2015 8:01 AM  Staffing Anesthesiologist: Lenard SimmerKARENZ, ANDREW Resident/CRNA: Junious SilkNOLES, Cortlynn Hollinsworth Performed: anesthesiologist   Preanesthetic Checklist Completed: patient identified, site marked, surgical consent, pre-op evaluation, timeout performed, IV checked, risks and benefits discussed and monitors and equipment checked  Epidural Patient position: sitting Prep: ChloraPrep Patient monitoring: heart rate, continuous pulse ox and blood pressure Approach: right paramedian Location: thoracic (1-12) Injection technique: LOR saline  Needle:  Needle type: Tuohy  Needle gauge: 18 G Needle length: 9 cm and 9 Needle insertion depth: 7.5 cm Catheter type: closed end flexible Catheter size: 20 Guage Catheter at skin depth: 12.5 cm Test dose: negative and 1.5% lidocaine with Epi 1:200 K  Assessment Events: blood not aspirated, injection not painful, no injection resistance, negative IV test and no paresthesia  Additional Notes   Patient tolerated the insertion well without complications.Reason for block:post-op pain management

## 2015-08-20 NOTE — Progress Notes (Signed)
Paged [facility] for clarification on duplicate continuous fluid orders. MD Thelma BargeOaks stated to discontinue LR order and to run D5 1/2 NS per order. Will continue to monitor patient.

## 2015-08-20 NOTE — Transfer of Care (Signed)
Immediate Anesthesia Transfer of Care Note  Patient: Jimmy OverlieJeffrey L Krausz  Procedure(s) Performed: Procedure(s): PREOP BRONCH, THORACOTOMY, RIGHT UPPER LOBECTOMY (Right)  Patient Location: PACU  Anesthesia Type:General  Level of Consciousness: sedated  Airway & Oxygen Therapy: Patient Spontanous Breathing and Patient connected to face mask oxygen  Post-op Assessment: Report given to RN and Post -op Vital signs reviewed and stable  Post vital signs: Reviewed and stable  Last Vitals:  Vitals:   08/20/15 0613  BP: (!) 162/115  Pulse: 68  Resp: 14  Temp: 36.4 C    Last Pain:  Vitals:   08/20/15 0613  TempSrc: Oral         Complications: No apparent anesthesia complications

## 2015-08-20 NOTE — Consult Note (Signed)
PULMONARY / CRITICAL CARE MEDICINE   Name: Jimmy Howell MRN: [id number] DOB: [birth year]    ADMISSION DATE:  08/20/2015 CONSULTATION DATE: 08/20/2015  REFERRING MD:  Dr. Thelma Barge  CHIEF COMPLAINT:  S/p Thoracotomy   HISTORY OF PRESENT ILLNESS:   This is a 45 yo male with a PMH of COPD, tobacco abuse, shortness of breath, asthma, GERD, and  restless leg syndrome.  He presented to [facility] on 8/14 for a preoperative bronchoscopy to assess endobronchial anatomy and right thoracotomy and right upper lobectomy due to CT of chest results on 07/13/2015 indicating an increase in size of right upper lobe lung nodule appearing partially cavitary concerning for primary bronchogenic carcinoma when compared to previous CT of chest findings on 04/03/2015.  PCCM consulted 8/14 for management of COPD.  PAST MEDICAL HISTORY :  He  has a past medical history of Asthma; Chickenpox; COPD (chronic obstructive pulmonary disease) (HCC); GERD (gastroesophageal reflux disease); Restless leg syndrome; and Shortness of breath dyspnea.  PAST SURGICAL HISTORY: He  has a past surgical history that includes Finger surgery and Hernia repair.  Allergies  Allergen Reactions  . Chantix [Varenicline] Nausea Only    No current facility-administered medications on file prior to encounter.    Current Outpatient Prescriptions on File Prior to Encounter  Medication Sig  . albuterol (PROVENTIL HFA;VENTOLIN HFA) 108 (90 Base) MCG/ACT inhaler Inhale 2 puffs into the lungs every 4 (four) hours as needed for wheezing or shortness of breath.  . fluticasone (FLOVENT HFA) 110 MCG/ACT inhaler Inhale 2 puffs into the lungs 2 (two) times daily.  . Glycopyrrolate-Formoterol (BEVESPI AEROSPHERE) 9-4.8 MCG/ACT AERO Inhale 2 puffs into the lungs 2 (two) times daily.  . nicotine (NICODERM CQ - DOSED IN MG/24 HOURS) 21 mg/24hr patch Place 21 mg onto the skin daily.  . pantoprazole (PROTONIX) 40 MG tablet TAKE 1 TABLET (40 MG TOTAL) BY MOUTH 2 (TWO)  TIMES DAILY.  Marland Kitchen buPROPion (WELLBUTRIN) 75 MG tablet Please take 150 mg (2 tablets) daily for 3 days, then increase to 150 mg (2 tablets) twice daily. (Patient not taking: Reported on 08/17/2015)    FAMILY HISTORY:  His @FAMSTP (<SUBSCRIPT> error)@  SOCIAL HISTORY: He  reports that he quit smoking about 6 weeks ago. His smoking use included Cigarettes. He smoked 0.50 packs per day. He has never used smokeless tobacco. He reports that he drinks alcohol. He reports that he does not use drugs.  REVIEW OF SYSTEMS:  Unable to assess pt lethargic post surgery  SUBJECTIVE:  Pt currently lethargic on 2L O2 via nasal canula no acute distress.  VITAL SIGNS: BP (!) 162/115   Pulse 68   Temp (P) 97.1 F (36.2 C)   Resp 20   Ht 5\' 9"  (1.753 m)   Wt 81.6 kg (180 lb)   SpO2 100%   BMI 26.58 kg/m   HEMODYNAMICS:    VENTILATOR SETTINGS:    INTAKE / OUTPUT: No intake/output data recorded.  PHYSICAL EXAMINATION: General:  Well developed, well nourished male Neuro:  Lethargic, follows commands HEENT:  Supple, no JVD Cardiovascular:  s1s2, rrr, no M/R/G Lungs:  Diminished throughout right greater than left, even, non labored; right pleural chest tube  Abdomen:  +BS x4, soft, non tender, non distended Musculoskeletal:  Normal bulk and tone Skin:  Right flank incision dressing dry and intact  LABS:  BMET No results for input(s): NA, K, CL, CO2, BUN, CREATININE, GLUCOSE in the last 168 hours.  Electrolytes No results for input(s): CALCIUM, MG, PHOS  in the last 168 hours.  CBC No results for input(s): WBC, HGB, HCT, PLT in the last 168 hours.  Coag's No results for input(s): APTT, INR in the last 168 hours.  Sepsis Markers No results for input(s): LATICACIDVEN, PROCALCITON, O2SATVEN in the last 168 hours.  ABG No results for input(s): PHART, PCO2ART, PO2ART in the last 168 hours.  Liver Enzymes No results for input(s): AST, ALT, ALKPHOS, BILITOT, ALBUMIN in the last 168  hours.  Cardiac Enzymes No results for input(s): TROPONINI, PROBNP in the last 168 hours.  Glucose No results for input(s): GLUCAP in the last 168 hours.  Imaging No results found.   STUDIES:  CT of Chest 07/13/15>>There has been increase in size of the right upper lobe lung nodule which now appears partially cavitary. Findings are highly concerning for primary bronchogenic carcinoma. Recommend thoracic surgery consultation and or biopsy. The other small nodules have remained unchanged. Diffuse bronchial wall thickening with emphysema, as above; imaging findings suggestive of underlying COPD. LAD coronary artery calcification.  CULTURES: None  ANTIBIOTICS: Cefuroxime 8/14>>  SIGNIFICANT EVENTS: 8/14- [facility] and right lobectomy admitted to ICU and PCCM consulted for management of COPD  LINES/TUBES: Right pleural chest tube 8/14>> PIV's 8/14>> Epidural Catheter 8/14>>  ASSESSMENT / PLAN:  PULMONARY A: Right thoracotomy and right lobectomy-right pleural chest tube (CT of chest 7/17 right upper lobe lung nodule concerning for primary bronchogenic carcinoma) Current smoker Hx: COPD P:   Scheduled duonebs q8hrs for the next 48hrs Hold outpatient flovent and bevespi Smoking cessation advised Pulmonary hygiene CXR in am Supplemental O2 as needed to maintain O2 sats 92% to 94%  CARDIOVASCULAR A:  No acute issues P:  Continuous telemetry monitoring  RENAL A:   No acute issues P:   Trend BMP's Replace electrolytes as indicated Monitor UOP Continue maintenance lactated ringers  GASTROINTESTINAL A:   Hx: GERD  P:   Prn phenergan for nausea or vomiting Pepcid for GERD NPO for now will defer to thoracic surgeon  HEMATOLOGIC A:   No acute issues P:  SCD's for VTE prophylaxis  Monitor for s/sx bleeding Monitor chest tube drainage Transfuse for hgb <7  INFECTIOUS A:   No acute issues P:   Trend WBC and monitor fever  curve  ENDOCRINE A:   No acute issues P:   Monitor serum glucose  NEUROLOGIC A:   Acute pain post thoracotomy  P:   RASS goal: N/A Prn fentanyl for pain management Continue epidural per thoracic surgeon recommendations   FAMILY  - Updates: Pt and pts wife updated about plan of care 8/14  - Inter-disciplinary family meet or Palliative Care meeting due by:  08/27/15  -Internal medicine consult placed for medical management 8/14  Sonda Rumbleana Blakeney, [place]  Pulmonary/Critical Care Pager [phone] (please enter 7 digits) PCCM Consult Pager [phone] (please enter 7 digits)  STAFF NOTE: I, Dr. Stephanie AcreVishal Jaydee Ingman have personally reviewed patient's available data, including medical history, events of note, physical examination and test results as part of my evaluation. I have discussed with NP Blakeney and other care providers such as pharmacist, RN and RRT.    S\P RUL lobectomy for RUL nodule, hx of smoking on Bevespi, Flovent and albuterol, PCCM consulted for COPD management.   PE: R chest tube with mild blood drainage (minimal), dec R BS, no wheezes   A:45 yo smoker, with RUL nodule, now s/p RUL lobectomy with R Chest tube placement  RUL nodule s/p RUL lobectomy  R PTX R Pleural effusion COPD Cigarette User GERD   P:  - cont with chest tube placement, monitor output - CXR in the AM - no inhaled steroids at this time, scheduled Duonebs over the next 3 days - maintain O2 sats >88% - restart home inhalers closer to discharge.    Marland Kitchen.  Rest per NP/medical resident whose note is outlined above and that I agree with  The patient is critically ill with multiple organ systems failure and requires high complexity decision making for assessment and support, frequent evaluation and titration of therapies, application of advanced monitoring technologies and extensive interpretation of multiple databases.   Pulmonary Care Time devoted to patient care services described in this note is   45 Minutes.   This time reflects time of care of this signee Dr Stephanie AcreVishal Adelayde Minney.  This critical care time does not reflect procedure time, or teaching time or supervisory time of PA/NP/Med-student/Med Resident etc but could involve care discussion time.  Stephanie AcreVishal Jamielee Mchale, MD [facility] Pager [phone] (please enter 7-digits) On Call Pager - [phone] (please enter 7-digits)  Note: This note was prepared with Dragon dictation along with smaller phrase technology. Any transcriptional errors that result from this process are unintentional.

## 2015-08-20 NOTE — H&P (View-Only) (Signed)
Jimmy Howell Inpatient Post-Op Note  Patient ID: Jimmy CurbJeffrey Lynn Howell, male   DOB: 1970-11-19, 45 y.o.   MRN: [id number]  HISTORY: He returns to the office today in follow-up. He was able to quit smoking. It is now been 2 weeks and he does feel that his breathing is improved. He occasionally gets short of breath with exertion. The patient did relate to me today that he had a sleep study performed several months ago which did not reveal any evidence of desaturations. He was prescribed oxygen therapy early on by Dr. Jeralene HuffKosse but he no longer uses that. He denied any fevers or chills.   Filed Vitals:   07/27/15 0928  BP: 154/99  Pulse: 77  Temp: 97.8 F (36.6 C)     EXAM: Resp: Lungs are clear bilaterally.  No respiratory distress, normal effort. Heart:  Regular without murmurs Abd:  Abdomen is soft, non distended and non tender. No masses are palpable.  There is no rebound and no guarding.  Neurological: Alert and oriented to person, place, and time. Coordination normal.  Skin: Skin is warm and dry. No rash noted. No diaphoretic. No erythema. No pallor.  Psychiatric: Normal mood and affect. Normal behavior. Judgment and thought content normal.    ASSESSMENT: I have again reviewed with him the indications and risks of surgery. Risks of bleeding, infection, air leak and death were all reviewed. Discussed the need for his smoking cessation and repeating his pulmonary function test. We will have those performed. In addition he will see cardiology for preoperative clearance. I again reviewed with him the possibility of performing a lung biopsy but he would like proceed directly to surgery. He understands that this may be an infectious process and that surgery in retrospect may not have been indicated.   PLAN:   We will have him see our cardiologist and repeat his pulmonary function tests. We will set him up for surgery once those are back.    Jimmy Marinimothy Ryelynn Guedea, MD

## 2015-08-20 NOTE — Progress Notes (Signed)
eLink Physician-Brief Progress Note Patient Name: Jimmy OverlieJeffrey L Howell DOB: [birth year] MRN: [id number]   Date of Service  08/20/2015  HPI/Events of Note  New patient evaluation. Known history of COPD & ongoing tobacco abuse. Currently with right-sided chest tube in place status post right thoracotomy. Pain 8/10. Respiratory status seems to remain stable on camera check. Currently with pain medication ordered continuous infusion & when necessary.   eICU Interventions  Continuing current plan of care.      Intervention Category Evaluation Type: New Patient Evaluation  Lawanda CousinsJennings Taray Normoyle 08/20/2015, 3:58 PM

## 2015-08-20 NOTE — Progress Notes (Signed)
MD Oaks rounded at the bedside and patient complained of right shoulder pain. MD Thelma Bargeaks stated for me to administer PRN Morphine. Will continue to monitor patient.

## 2015-08-20 NOTE — Anesthesia Procedure Notes (Signed)
Procedure Name: Intubation Date/Time: 08/20/2015 8:10 AM Performed by: Junious SilkNOLES, Eythan Jayne Pre-anesthesia Checklist: Patient identified, Patient being monitored, Timeout performed, Emergency Drugs available and Suction available Patient Re-evaluated:Patient Re-evaluated prior to inductionOxygen Delivery Method: Circle system utilized Preoxygenation: Pre-oxygenation with 100% oxygen Intubation Type: IV induction Ventilation: Mask ventilation without difficulty Laryngoscope Size: 3 and McGraph Grade View: Grade I Tube type: Oral Endobronchial tube: Double lumen EBT, Left and EBT position confirmed by fiberoptic bronchoscope and 39 Fr Tube size: 7.0 mm Number of attempts: 1 Airway Equipment and Method: Stylet,  Video-laryngoscopy and Fiberoptic brochoscope Placement Confirmation: ETT inserted through vocal cords under direct vision,  positive ETCO2 and breath sounds checked- equal and bilateral Secured at: 21 cm Tube secured with: Tape Dental Injury: Teeth and Oropharynx as per pre-operative assessment

## 2015-08-20 NOTE — Progress Notes (Signed)
Patient arrived to the ICU from PACU on 2L Jimmy Howell, NSR, alert but drowsy. Chest tube system had an output of 150 mLs in canister when patient arrived to ICU. Documentation of output started at this point. Will continue to monitor.

## 2015-08-20 NOTE — Interval H&P Note (Signed)
History and Physical Interval Note:  08/20/2015 7:17 AM  Jimmy OverlieJeffrey L Houchen  has presented today for surgery, with the diagnosis of LUNG MASS  The various methods of treatment have been discussed with the patient and family. After consideration of risks, benefits and other options for treatment, the patient has consented to  Procedure(s): PREOP BRONCH, THORACOTOMY, RIGHT UPPER LOBECTOMY (Right) as a surgical intervention .  The patient's history has been reviewed, patient examined, no change in status, stable for surgery.  I have reviewed the patient's chart and labs.  Questions were answered to the patient's satisfaction.     Hulda Marinimothy Quantarius Genrich

## 2015-08-20 NOTE — Op Note (Signed)
08/20/2015  1:43 PM  PATIENT:  Jimmy Howell  45 y.o. male  PRE-OPERATIVE DIAGNOSIS:  Right upper lobe mass  POST-OPERATIVE DIAGNOSIS:  Same  PROCEDURE:  Preoperative bronchoscopy to assess endobronchial anatomy and right thoracotomy and right upper lobectomy  SURGEON:  Surgeon(s) and Role:    * Hulda Marinimothy Logan Vegh, MD - Primary    * Tiney Rougealph Ely III, MD - Assisting  ASSISTANTS: Jerlyn LyAlexis Sampson PA S  ANESTHESIA: Gen.  INDICATIONS FOR PROCEDURE this is a 45 year old gentleman with an enlarging right upper lobe mass on CT scan. In the setting of his significant tobacco use he was offered the above-mentioned procedure for definitive diagnosis and treatment. Indications and risks were explained to the patient was informed consent.  DICTATION: Patient was brought to the operative suite and placed in supine position. General endotracheal anesthesia was given with a double-lumen tube. Preoperative bronchoscopy was carried out. This was normal to the subsegmental levels bilaterally. There is no evidence of endobronchial tumor. The patient was then turned for right thoracotomy. All pressure points were carefully padded. Patient was prepped and draped in usual sterile fashion.  A posterior lateral fifth interspace thoracotomy was performed. The serratus muscle was spared. The chest was entered. The inferior pulmonary ligament was divided. Palpation of the lung revealed a dominant mass in the right upper lobe just above the fissure.  It measured about 1.5 cm. There was no other disease present and we elected to proceed on with a right upper lobectomy. It did not appear that the tumor could be resected or biopsied short of lobectomy and therefore this performed.  Mediastinal poor was incised exposing the superior pulmonary vein as well as the pulmonary artery and bronchus. There were 2 large branches at the origin of the right upper lobe bronchus going to the upper lobe. In addition there was a separate  smaller pulmonary vein drainage from the upper lobe into the superior portion of the superior pulmonary vein.  We began by dividing the small venous branch. This gave us better exposure to the 2 large arterial branches going to the upper lobe. These were also taken with a vascular stapler. The fissure was opened and we identified a large posterior segmental vein crossing over the pulmonary artery. We traced this over to the hilum anteriorly and then completed the fissure between the upper lobe and middle lobes using an endoscopic stapler with gold loads.  The pulmonary artery was then identified in the depths of the wound. The very large venous drainage of the upper lobe was then transected and this allowed us to identify the branches to the lower lobe and middle lobes. Just above this we completed the fissure posteriorly by placing a right angle clamp just above the bifurcation of the upper lobe bronchus intermedius into the fissure.  There is a very small posterior ascending branch which was secured with 4-0 Prolene sutures. All lymph nodes were then swept up into the right upper lobe bronchus and the right upper lobe bronchus was secured with a TX stapler. The middle and lower lobes ventilated nicely. The stapler was fired and the upper lobe was transected. The middle lobe and lower lobe were nearly completely separate and they were secured together with a stapling device to prevent torsion.  The chest was copiously irrigated. The bronchial stump was checked at 30 cm of water pressure and there was no air leak. The chest was then closed after assuring complete hemostasis. 2 chest tubes were inserted in standard position.  We then searched in the pretracheal space and then in the subcarinal space for small lymph nodes and there were none that could be identified.    The chest was then closed by reapproximating the ribs with #2 Vicryl. Our access port was closed with multiple layers of Vicryl and nylon on the  skin. The latissimus muscle was closed with #2 Vicryl subcutaneous status tissues with 2-0 Vicryl and the skin with skin clips. Sterile dressings were applied. Patient was then extubated and taken to the recovery room in stable condition.  All sponge needle and instrument counts were correct as reported to me at the end of the case.  Hulda Marinimothy Juwon Scripter, MD

## 2015-08-20 NOTE — Anesthesia Preprocedure Evaluation (Signed)
Anesthesia Evaluation  Patient identified by MRN, date of birth, ID band Patient awake    Reviewed: Allergy & Precautions, H&P , NPO status , Patient's Chart, lab work & pertinent test results, reviewed documented beta blocker date and time   History of Anesthesia Complications (+) Family history of anesthesia reaction and history of anesthetic complications  Airway Mallampati: II  TM Distance: >3 FB Neck ROM: full    Dental no notable dental hx. (+) Missing   Pulmonary shortness of breath and with exertion, asthma , neg sleep apnea, COPD,  COPD inhaler, neg recent URI, former smoker,    Pulmonary exam normal breath sounds clear to auscultation       Cardiovascular Exercise Tolerance: Good negative cardio ROS Normal cardiovascular exam Rhythm:regular Rate:Normal     Neuro/Psych negative neurological ROS  negative psych ROS   GI/Hepatic Neg liver ROS, GERD  ,  Endo/Other  negative endocrine ROS  Renal/GU negative Renal ROS  negative genitourinary   Musculoskeletal   Abdominal   Peds  Hematology negative hematology ROS (+)   Anesthesia Other Findings Past Medical History: No date: Asthma No date: Chickenpox No date: COPD (chronic obstructive pulmonary disease) (* No date: GERD (gastroesophageal reflux disease) No date: Restless leg syndrome No date: Shortness of breath dyspnea     Comment: with exertion   Reproductive/Obstetrics negative OB ROS                             Anesthesia Physical Anesthesia Plan  ASA: II  Anesthesia Plan: General   Post-op Pain Management:    Induction:   Airway Management Planned:   Additional Equipment:   Intra-op Plan:   Post-operative Plan:   Informed Consent: I have reviewed the patients History and Physical, chart, labs and discussed the procedure including the risks, benefits and alternatives for the proposed anesthesia with the  patient or authorized representative who has indicated his/her understanding and acceptance.   Dental Advisory Given  Plan Discussed with: Anesthesiologist, CRNA and Surgeon  Anesthesia Plan Comments:         Anesthesia Quick Evaluation

## 2015-08-21 ENCOUNTER — Encounter: Payer: Self-pay | Admitting: Internal Medicine

## 2015-08-21 ENCOUNTER — Inpatient Hospital Stay: Payer: 59

## 2015-08-21 DIAGNOSIS — S270XXD Traumatic pneumothorax, subsequent encounter: Secondary | ICD-10-CM

## 2015-08-21 LAB — TYPE AND SCREEN
ABO/RH(D): O POS
Antibody Screen: NEGATIVE
UNIT DIVISION: 0
Unit division: 0
Unit division: 0

## 2015-08-21 LAB — RAPID HIV SCREEN (HIV 1/2 AB+AG)
HIV 1/2 Antibodies: NONREACTIVE
HIV-1 P24 ANTIGEN - HIV24: NONREACTIVE

## 2015-08-21 LAB — PREPARE RBC (CROSSMATCH)

## 2015-08-21 MED ORDER — NALOXONE HCL 0.4 MG/ML IJ SOLN: 0.4000 mg | INTRAMUSCULAR | Status: DC | PRN

## 2015-08-21 MED ORDER — ONDANSETRON HCL 4 MG/2ML IJ SOLN
4.0000 mg | Freq: Four times a day (QID) | INTRAMUSCULAR | Status: DC | PRN
  Administered 2015-08-22: 4 mg via INTRAVENOUS
  Filled 2015-08-21: qty 2

## 2015-08-21 MED ORDER — KETOROLAC TROMETHAMINE 30 MG/ML IJ SOLN
30.0000 mg | Freq: Three times a day (TID) | INTRAMUSCULAR | Status: DC | PRN
  Administered 2015-08-21: 30 mg via INTRAVENOUS
  Filled 2015-08-21 (×2): qty 1

## 2015-08-21 MED ORDER — FENTANYL 2.5 MCG/ML W/ROPIVACAINE 0.2% IN NS 100 ML EPIDURAL INFUSION (ARMC-ANES)
4.0000 mL/h | EPIDURAL | Status: DC
  Administered 2015-08-21: 6 mL/h via EPIDURAL
  Administered 2015-08-22 – 2015-08-24 (×5): 8 mL/h via EPIDURAL
  Administered 2015-08-25 (×2): 4 mL/h via EPIDURAL
  Filled 2015-08-21 (×8): qty 100

## 2015-08-21 MED ORDER — DIPHENHYDRAMINE HCL 50 MG/ML IJ SOLN: 12.5000 mg | Freq: Four times a day (QID) | INTRAMUSCULAR | Status: DC | PRN

## 2015-08-21 MED ORDER — CEFUROXIME IN STERILE WATER 1.5 GM/50ML IV SOLN
1.5000 g | Freq: Once | INTRAVENOUS | Status: AC
Start: 2015-08-21 — End: 2015-08-21
  Administered 2015-08-21: 1.5 g via INTRAVENOUS

## 2015-08-21 MED ORDER — DIPHENHYDRAMINE HCL 12.5 MG/5ML PO ELIX
12.5000 mg | ORAL_SOLUTION | Freq: Four times a day (QID) | ORAL | Status: DC | PRN
  Filled 2015-08-21: qty 5

## 2015-08-21 MED ORDER — TRAMADOL HCL 50 MG PO TABS
100.0000 mg | ORAL_TABLET | Freq: Four times a day (QID) | ORAL | Status: DC
  Administered 2015-08-21 – 2015-08-26 (×20): 100 mg via ORAL
  Filled 2015-08-21 (×22): qty 2

## 2015-08-21 MED ORDER — MORPHINE SULFATE 2 MG/ML IV SOLN
INTRAVENOUS | Status: DC
  Administered 2015-08-21: 09:00:00 via INTRAVENOUS
  Administered 2015-08-21: 19.5 mg via INTRAVENOUS
  Administered 2015-08-21: 16.5 mg via INTRAVENOUS
  Administered 2015-08-21: 9 mg via INTRAVENOUS
  Administered 2015-08-22: 13.5 mg via INTRAVENOUS
  Filled 2015-08-21 (×2): qty 25

## 2015-08-21 MED ORDER — ALBUTEROL SULFATE (2.5 MG/3ML) 0.083% IN NEBU
2.5000 mg | INHALATION_SOLUTION | RESPIRATORY_TRACT | Status: DC
  Administered 2015-08-21 – 2015-08-22 (×4): 2.5 mg via RESPIRATORY_TRACT
  Filled 2015-08-21 (×6): qty 3

## 2015-08-21 MED ORDER — SODIUM CHLORIDE 0.9% FLUSH: 9.0000 mL | INTRAVENOUS | Status: DC | PRN

## 2015-08-21 MED ORDER — MORPHINE SULFATE (PF) 4 MG/ML IV SOLN
4.0000 mg | Freq: Once | INTRAVENOUS | Status: AC
  Administered 2015-08-21: 4 mg via INTRAVENOUS
  Filled 2015-08-21: qty 1

## 2015-08-21 NOTE — Progress Notes (Signed)
Reviewed path with Dr Oneita Krasubinas. AFB present on stain. Discuss[facility] case with [facility]ackery [facility]out [facility] [place] [facility]. Agrees unlikley to be TB. [facility]

## 2015-08-21 NOTE — Care Management Note (Signed)
Case Management Note  Patient Details  Name: Jimmy Howell MRN: [id number] Date of Birth: 1970/07/31  Subjective/Objective:                  Met with patient and his wife to discuss discharge planning. Patient currently has chest tube and uncertain if he will go home with it or not. He is not on O2 at home. He is independent with mobility/drives. His PCP is with [facility] on [address]. He denies RNCM needs.  Action/Plan: RNCM to follow for chest tube/O2 need.   Expected Discharge Date:                  Expected Discharge Plan:     In-House Referral:     Discharge planning Services  CM Consult  Post Acute Care Choice:    Choice offered to:  Spouse, Patient  DME Arranged:    DME Agency:     HH Arranged:    [place] Agency:     Status of Service:  In process, will continue to follow  If discussed at Long Length of Stay Meetings, dates discussed:    Additional Comments:  Marshell Garfinkel, RN 08/21/2015, 11:26 AM

## 2015-08-21 NOTE — Anesthesia Postprocedure Evaluation (Signed)
Anesthesia Post Note  Patient: Marcelle OverlieJeffrey L Mosey  Procedure(s) Performed: Procedure(s) (LRB): PREOP BRONCH, THORACOTOMY, RIGHT UPPER LOBECTOMY (Right)  Patient location during evaluation: ICU Anesthesia Type: Epidural and General Level of consciousness: awake and alert and oriented Pain management: pain level not controlled Vital Signs Assessment: post-procedure vital signs reviewed and stable Respiratory status: respiratory function stable Cardiovascular status: blood pressure returned to baseline and stable Anesthetic complications: no Comments: Dr. Thelma Bargeaks decision to D/C epidural due to inadequate shoulder pain control.  PCA for pain control per Dr. Thelma Bargeaks, anesthesia requested to D/C epidural catheter.    Last Vitals:  Vitals:   08/21/15 0500 08/21/15 0600  BP: 109/78 125/77  Pulse: 73 80  Resp: 10 16  Temp:      Last Pain:  Vitals:   08/21/15 0704  TempSrc:   PainSc: 8                  Clydene PughBeane, Laquon Emel D

## 2015-08-21 NOTE — Consult Note (Signed)
[facility]     Reason for Consult:Granulomatous lung mass   Referring Physician: Marta Lamas Date of Admission:  08/20/2015   Active Problems:   Lung mass   Nodule of right lung   Pleural effusion   Pneumothorax, traumatic   Centrilobular emphysema ([place])   Cigarette smoker   HPI: Jimmy Howell is a 45 y.o. male admitted for elective resection of a lung mass first detected in Jan 2017 when he was found on cxr done for chest pain to have a RUL nodule. Serial CTs have been done including a PET scan and due to increase in size and some central cavitation resection was performed. Path now showing necrotizing granulomatous findings. 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  No travel hx internationally (except for occas short cruises) nor much throughout the [place].  No [employer] service or prison time. No animal exposure. He has no systemic sxs, no wt loss (some gain), no fevers, nightsweats. He does not have a productive cough  Past Medical History:  Diagnosis Date  . Asthma   . Chickenpox   . COPD (chronic obstructive pulmonary disease) ([place])   . GERD (gastroesophageal reflux disease)   . Restless leg syndrome   . Shortness of breath dyspnea    with exertion   Past Surgical History:  Procedure Laterality Date  . FINGER SURGERY    . HERNIA REPAIR     Inguinal Hernia Repair  . VIDEO ASSISTED THORACOSCOPY (VATS)/THOROCOTOMY Right 08/20/2015   Procedure: PREOP BRONCH, THORACOTOMY, RIGHT UPPER LOBECTOMY;  Surgeon: Nestor Lewandowsky, MD;  Location: [facility] ORS;  Service: General;  Laterality: Right;   Social History  Substance Use Topics  . Smoking status: Former Smoker    Packs/day: 0.50    Types: Cigarettes    Quit date: 07/07/2015  . Smokeless tobacco: Never Used  . Alcohol use 0.0 oz/week     Comment: 1 Case of Beer / Weekly   Family History  Problem Relation Age of Onset  . Bladder  Cancer Father   . Cancer Paternal Aunt     stomach  . Arthritis      Parent  . Hyperlipidemia      Parent  . Heart disease      Parent  . Hypertension      Parent  . Kidney disease      Parent    Allergies:  Allergies  Allergen Reactions  . Chantix [Varenicline] Nausea Only    Current antibiotics: Antibiotics Given (last 72 hours)    Date/Time Action Medication Dose Rate   08/20/15 1400 Given   cefUROXime (ZINACEF) 1.5 g in dextrose 5 % 50 mL IVPB 1.5 g 100 mL/hr   08/21/15 0205 Given   cefUROXime (ZINACEF) 1.5 g in dextrose 5 % 50 mL IVPB 1.5 g 100 mL/hr      MEDICATIONS: . albuterol  2.5 mg Nebulization Q4H WA  . antiseptic oral rinse  7 mL Mouth Rinse BID  . bisacodyl  10 mg Oral Daily  . buPROPion  75 mg Oral BID  . cefUROXime (ZINACEF)  IV  1.5 g Intravenous Q12H  . morphine   Intravenous Q4H  . mupirocin nasal ointment  1 application Nasal BID  . nicotine  21 mg Transdermal Daily  . pantoprazole  40 mg Oral Daily  . traMADol  100 mg Oral Q6H    Review of Systems -  11 systems reviewed and negative per HPI   OBJECTIVE: Temp:  [97.3 F (36.3 C)-98.5 F (36.9 C)] 98.1 F (36.7 C) (08/15 0800) Pulse Rate:  [67-90] 89 (08/15 1200) Resp:  [9-24] 20 (08/15 1241) BP: (100-152)/(70-95) 152/93 (08/15 1200) SpO2:  [92 %-100 %] 93 % (08/15 1241) Weight:  [89.7 kg (197 lb 12 oz)] 89.7 kg (197 lb 12 oz) (08/14 1522) Physical Exam  Constitutional: He is oriented to person, place, and time. He appears well-developed and well-nourished. No distress. Ruddy complexion on face HENT: anicteric Mouth/Throat: Oropharynx is clear and moist. No oropharyngeal exudate.  Cardiovascular: Normal rate, regular rhythm and normal heart sounds.  Pulmonary/Chest: Chest tube with ss drainage, rhonchi R side Abdominal: Soft. Bowel sounds are normal. He exhibits no distension. There is no tenderness.  Lymphadenopathy: He has no cervical adenopathy.  Neurological: He is alert and  oriented to person, place, and time.  Skin: Skin is warm and dry. No rash noted. No erythema.  Psychiatric: He has a normal mood and affect. His behavior is normal.   LABS: Results for orders placed or performed during the hospital encounter of 08/20/15 (from the past 48 hour(s))  Prepare RBC (crossmatch)     Status: None   Collection Time: 08/20/15  7:00 AM  Result Value Ref Range   Order Confirmation ORDER PROCESSED BY BLOOD BANK   Glucose, capillary     Status: Abnormal   Collection Time: 08/20/15  3:27 PM  Result Value Ref Range   Glucose-Capillary 167 (H) 65 - 99 mg/dL  CBC     Status: Abnormal   Collection Time: 08/20/15  3:39 PM  Result Value Ref Range   WBC 16.2 (H) 3.8 - 10.6 K/uL   RBC 5.16 4.40 - 5.90 MIL/uL   Hemoglobin 16.0 13.0 - 18.0 g/dL   HCT 46.7 40.0 - 52.0 %   MCV 90.5 80.0 - 100.0 fL   MCH 31.1 26.0 - 34.0 pg   MCHC 34.4 32.0 - 36.0 g/dL   RDW 12.7 11.5 - 14.5 %   Platelets 197 150 - 440 K/uL  Basic metabolic panel     Status: Abnormal   Collection Time: 08/20/15  3:39 PM  Result Value Ref Range   Sodium 134 (L) 135 - 145 mmol/L   Potassium 4.3 3.5 - 5.1 mmol/L   Chloride 101 101 - 111 mmol/L   CO2 26 22 - 32 mmol/L   Glucose, Bld 144 (H) 65 - 99 mg/dL   BUN 11 6 - 20 mg/dL   Creatinine, Ser 0.62 0.61 - 1.24 mg/dL   Calcium 8.5 (L) 8.9 - 10.3 mg/dL   GFR calc non Af Amer >60 >60 mL/min   GFR calc Af Amer >60 >60 mL/min    Comment: (NOTE) The eGFR has been calculated using the CKD EPI equation. This calculation has not been validated in all clinical situations. eGFR's persistently <60 mL/min signify possible Chronic Kidney Disease.    Anion gap 7 5 - 15   No components found for: ESR, C REACTIVE PROTEIN MICRO: Recent Results (from the past 720 hour(s))  Surgical pcr screen     Status: Abnormal   Collection Time: 08/17/15  9:31 AM  Result Value Ref Range Status   MRSA, PCR NEGATIVE NEGATIVE Final   Staphylococcus aureus POSITIVE (A) NEGATIVE  Final    Comment:        The Xpert SA Assay (FDA approved for NASAL specimens in patients over 60 years of age), is one  component of a comprehensive surveillance program.  Test performance has been validated by [facility] for patients greater than or equal to 2 year old. It is not intended to diagnose infection nor to guide or monitor treatment.     IMAGING: Dg Chest 2 View  Result Date: 07/27/2015 CLINICAL DATA:  Lung surgery.  Smoking history.  Preop chest x-ray. EXAM: CHEST  2 VIEW COMPARISON:  CT 07/13/2015. FINDINGS: Pulmonary nodule right upper lobe again noted. Other pulmonary nodules present are best identified by prior CT. Lungs are clear of acute infiltrates. No pleural effusion or pneumothorax. Heart size normal. No acute bony abnormality . IMPRESSION: Persistent right upper lung pulmonary nodule again noted. Remaining tiny pulmonary nodules noted on prior CT of 07/13/2015 are best identified by CT. No acute cardiopulmonary disease. Electronically Signed   By: Marcello Moores  Register   On: 07/27/2015 16:16   Dg Chest Port 1 View  Result Date: 08/21/2015 CLINICAL DATA:  Post thoracotomy EXAM: PORTABLE CHEST 1 VIEW COMPARISON:  Portable exam 0542 hours compared 08/20/2015 FINDINGS: RIGHT thoracostomy tube stable. Normal heart size and pulmonary vascularity. Prominence of RIGHT hilum likely related to atelectasis and postsurgical changes. Staple lines in RIGHT upper lobe. Improved aeration since previous study. No definite infiltrate, pleural effusion or pneumothorax. Small amount of chest wall emphysema on RIGHT extending into RIGHT cervical region. New line bones demineralized. IMPRESSION: Postsurgical changes of the RIGHT hemithorax without definite pneumothorax. Electronically Signed   By: Lavonia Dana M.D.   On: 08/21/2015 07:30   Dg Chest Port 1 View  Result Date: 08/20/2015 CLINICAL DATA:  Status post surgical removal of a right upper lobe lung nodule. EXAM: PORTABLE CHEST 1 VIEW  COMPARISON:  07/27/2015. FINDINGS: Interval surgical staples in the lateral aspect of the right upper lobe. The previously seen right upper lobe nodule is no longer demonstrated. There is an approximately 15% loculated right upper pneumothorax and a small amount of subcutaneous air. A right chest tube is in place. Interval small amount of linear atelectasis in the left mid to upper lung zone. The interstitial markings are mildly prominent. Normal sized heart. Unremarkable bones. IMPRESSION: 1. Interval right upper lobe lung nodule resection with an approximately 15% right upper pneumothorax. 2. Interval mild prominence of the interstitial markings, most likely due to crowding as a result of a decreased depth of inspiration. These results will be called to the ordering clinician or representative by the Radiologist Assistant, and communication documented in the PACS or zVision Dashboard. Electronically Signed   By: Claudie Revering M.D.   On: 08/20/2015 14:30    Assessment:   CARLITOS BOTTINO is a 45 y.o. male with long heavy smoking hx and mod COPD now s.p resection of RUL lung mass progressively increasing in size from January.  He also has a granuloma noted on CT in the liver.  He has no systemic symptoms of fevers, wt loss, night sweats or cough. No TB exposure.  DIff dx includes infectious etiologies - fungal (histoplasma, Coccidiodes, Blastomycosis, Cryptococcus), Mycobacteria (TB, NTMB) and non  Infectious such as sarcoidosis, hypersensitivity, We[place]ers.  Recommendations Check HIV Check AFB x 3 Await final path - will need AFB and fungal stains if possible Check crypto ag, fungal antibody panel (histoplasma, coccidiodies, blasto and aspergillus) No need for abx at this time Thank you very much for allowing me to participate in the care of this patient. Please call with questions.   Cheral Marker. Ola Spurr, MD

## 2015-08-21 NOTE — Anesthesia Procedure Notes (Signed)
Epidural Patient location during procedure: ICU Start time: 08/21/2015 1:25 PM End time: 08/21/2015 1:39 PM  Staffing Anesthesiologist: Alver FisherPENWARDEN, Oluwadamilare Tobler Performed: anesthesiologist   Preanesthetic Checklist Completed: patient identified, site marked, surgical consent, pre-op evaluation, timeout performed, IV checked, risks and benefits discussed and monitors and equipment checked  Epidural Patient position: sitting Prep: ChloraPrep Patient monitoring: heart rate, cardiac monitor, continuous pulse ox and blood pressure Approach: midline Location: thoracic (1-12) Injection technique: LOR saline  Needle:  Needle type: Tuohy  Needle gauge: 17 G Needle length: 9 cm Needle insertion depth: 6 cm Catheter type: closed end flexible Catheter size: 19 Gauge Catheter at skin depth: 10 cm Test dose: negative and 1.5% lidocaine with Epi 1:200 K  Additional Notes

## 2015-08-21 NOTE — Progress Notes (Signed)
Patient having worsening pain since epidural was removed this morning. Called by surgeon about possible new epidural. Patient now having pain around chest tube site that he wasn't having this morning. Seems that previous epidural was covering chest tube site, but not incision and now that epidural has worn off he is having pain at both sites. Has been using PCA as well as ketorolac without adequate pain relief. Patient agreeable to replacement of epidural. Plan to place a level higher than previous epidural to hopefully cover both chest tube site and incision. Will reassess after epidural if need for PCA as well.

## 2015-08-21 NOTE — Progress Notes (Addendum)
Paged Dr. Sheryle Hailiamond concerning pts complaining of back pain after xray.  Pt states it is a 10/10.  Per Dr. Sheryle Hailiamond, give a one time dose of 4mg . Morphine.

## 2015-08-21 NOTE — Progress Notes (Signed)
Paged MD Oaks to inform him of patients uncontrolled pain in right shoulder/back. Notified MD Thelma BargeOaks that patient received one time dose of 4mg  of Morphine at 0630 and patient is still complaining of uncontrolled pain. MD Thelma BargeOaks stated he would be rounding shortly. Will continue to monitor patient.

## 2015-08-21 NOTE — Progress Notes (Signed)
Educated family that patient was being moved to a negative pressure room and staff and family members would need to wear appropriate masks when inside the room. Family declined and refused to wear masks. I stated that they would be surgical masks and if they changed their mind that they would be outside of the door in an isolation caddy.

## 2015-08-21 NOTE — Anesthesia Post-op Follow-up Note (Signed)
  Anesthesia Pain Follow-up Note  Patient: Jimmy Howell  Day #: 1  Date of Follow-up: 08/21/2015 Time: 8:53 AM  Last Vitals:  Vitals:   08/21/15 0500 08/21/15 0600  BP: 109/78 125/77  Pulse: 73 80  Resp: 10 16  Temp:      Level of Consciousness: alert  Pain: moderate   Side Effects:None  Catheter Site Exam:clean, dry     Plan: Catheter removed/tip intact   Dr. Thelma Bargeaks switching patient to PCA for pain control.  Catheter tip intact upon removal.  Pain control per Dr. Thelma Bargeaks.   Clydene PughBeane, Eitan Doubleday D

## 2015-08-21 NOTE — Progress Notes (Signed)
[facility] [place] [facility]   PATIENT NAME: Jimmy Howell    MR#:  [id number]  DATE OF BIRTH:  1970-08-21  SUBJECTIVE:  Pain at the surgical site. Epidural readjusted at different location  REVIEW OF SYSTEMS:   Review of Systems  Constitutional: Negative for chills, fever and weight loss.  HENT: Negative for ear discharge, ear pain and nosebleeds.   Eyes: Negative for blurred vision, pain and discharge.  Respiratory: Negative for sputum production, shortness of breath, wheezing and stridor.   Cardiovascular: Positive for chest pain. Negative for palpitations, orthopnea and PND.  Gastrointestinal: Negative for abdominal pain, diarrhea, nausea and vomiting.  Genitourinary: Negative for frequency and urgency.  Musculoskeletal: Positive for back pain. Negative for joint pain.  Neurological: Positive for weakness. Negative for sensory change, speech change and focal weakness.  Psychiatric/Behavioral: Negative for depression and hallucinations. The patient is not nervous/anxious.   All other systems reviewed and are negative.  Tolerating Diet: some Tolerating PT: not needed  DRUG ALLERGIES:   Allergies  Allergen Reactions  . Chantix [Varenicline] Nausea Only    VITALS:  Blood pressure (!) 152/93, pulse 89, temperature 98.1 F (36.7 C), temperature source Oral, resp. rate 20, height 5\' 9"  (1.753 m), weight 197 lb 12 oz (89.7 kg), SpO2 93 %.  PHYSICAL EXAMINATION:   Physical Exam  GENERAL:  45 y.o.-year-old patient lying in the bed with no acute distress.  EYES: Pupils equal, round, reactive to light and accommodation. No scleral icterus. Extraocular muscles intact.  HEENT: Head atraumatic, normocephalic. Oropharynx and nasopharynx clear.  NECK:  Supple, no jugular venous distention. No thyroid enlargement, no tenderness.  LUNGS: Normal breath sounds bilaterally, no wheezing, rales, rhonchi. No use of accessory muscles of respiration. Right CT  + CARDIOVASCULAR: S1, S2 normal. No murmurs, rubs, or gallops.  ABDOMEN: Soft, nontender, nondistended. Bowel sounds present. No organomegaly or mass.  EXTREMITIES: No cyanosis, clubbing or edema b/l.    NEUROLOGIC: Cranial nerves II through XII are intact. No focal Motor or sensory deficits b/l.   PSYCHIATRIC:  patient is alert and oriented x 3.  SKIN: No obvious rash, lesion, or ulcer.   LABORATORY PANEL:  CBC  Recent Labs Lab 08/20/15 1539  WBC 16.2*  HGB 16.0  HCT 46.7  PLT 197    Chemistries   Recent Labs Lab 08/20/15 1539  NA 134*  K 4.3  CL 101  CO2 26  GLUCOSE 144*  BUN 11  CREATININE 0.62  CALCIUM 8.5*   Cardiac Enzymes No results for input(s): TROPONINI in the last 168 hours. RADIOLOGY:  Dg Chest Port 1 View  Result Date: 08/21/2015 CLINICAL DATA:  Post thoracotomy EXAM: PORTABLE CHEST 1 VIEW COMPARISON:  Portable exam 0542 hours compared 08/20/2015 FINDINGS: RIGHT thoracostomy tube stable. Normal heart size and pulmonary vascularity. Prominence of RIGHT hilum likely related to atelectasis and postsurgical changes. Staple lines in RIGHT upper lobe. Improved aeration since previous study. No definite infiltrate, pleural effusion or pneumothorax. Small amount of chest wall emphysema on RIGHT extending into RIGHT cervical region. New line bones demineralized. IMPRESSION: Postsurgical changes of the RIGHT hemithorax without definite pneumothorax. Electronically Signed   By: Ulyses SouthwardMark  Boles M.D.   On: 08/21/2015 07:30   Dg Chest Port 1 View  Result Date: 08/20/2015 CLINICAL DATA:  Status post surgical removal of a right upper lobe lung nodule. EXAM: PORTABLE CHEST 1 VIEW COMPARISON:  07/27/2015. FINDINGS: Interval surgical staples in the lateral aspect of the right upper lobe.  The previously seen right upper lobe nodule is no longer demonstrated. There is an approximately 15% loculated right upper pneumothorax and a small amount of subcutaneous air. A right chest tube is  in place. Interval small amount of linear atelectasis in the left mid to upper lung zone. The interstitial markings are mildly prominent. Normal sized heart. Unremarkable bones. IMPRESSION: 1. Interval right upper lobe lung nodule resection with an approximately 15% right upper pneumothorax. 2. Interval mild prominence of the interstitial markings, most likely due to crowding as a result of a decreased depth of inspiration. These results will be called to the ordering clinician or representative by the Radiologist Assistant, and communication documented in the PACS or zVision Dashboard. Electronically Signed   By: Beckie SaltsSteven  Reid M.D.   On: 08/20/2015 14:30   ASSESSMENT AND PLAN:  45 year old male status post right upper lobectomy seen for medical management.  1. Lung nodule: Status post thoracotomy; chest tube draining serosanguineous fluid well. -cont pain management with Epidural PCA per anesthesia - prn tramadol for moderate pain.  -Pathology pending. Await biopsy results. -sats >92% on 2 liter  2. COPD: Albuterol as needed. Restart inhaled corticosteroid when okay by CT surgery. Pneumothorax present as expected following thoracotomy.  3. Tobacco abuse: NicoDerm patch  4. CAD: Per CT scan of the chest last month the patient had significant calcifications of the LAD. Stress test prior to surgery negative for coronary ischemia. Start aspirin and cleared by surgery.    5. DVT prophylaxis: SCDs  6. GI prophylaxis: Pantoprazole  Case discussed with Care Management/Social Worker. Management plans discussed with the patient, family and they are in agreement.  CODE STATUS: full  DVT Prophylaxis:lovenox  TOTAL TIME TAKING CARE OF THIS PATIENT: 30 minutes.  >50% time spent on counselling and coordination of care  POSSIBLE D/C IN 1-2 DAYS, DEPENDING ON CLINICAL CONDITION.  Note: This dictation was prepared with Dragon dictation along with smaller phrase technology. Any transcriptional errors  that result from this process are unintentional.  Keisy Strickler M.D on 08/21/2015 at 2:42 PM  Between 7am to 6pm - Pager - [phone]  After 6pm go to [email or web address] - password [email or web address] [facility]  [facility]  Office  [phone]  CC: Primary care physician; Marikay AlarEric Sonnenberg, MD

## 2015-08-21 NOTE — Consult Note (Signed)
Reason for Consult: Medical management Referring Physician: Dr. Clovis Fredrickson is an 45 y.o. male.  HPI: The patient is postoperative day 1 status post right upper lobectomy due to pulmonary nodule. Tolerated surgery well and is on room air. No fevers and he is tolerating by mouth intake. The patient complains of right shoulder pain but denies shortness of breath, chest pain, nausea, vomiting or diaphoresis. The hospitalist service has been consulted for medical management.  Past Medical History:  Diagnosis Date  . Asthma   . Chickenpox   . COPD (chronic obstructive pulmonary disease) ([place])   . GERD (gastroesophageal reflux disease)   . Restless leg syndrome   . Shortness of breath dyspnea    with exertion    Past Surgical History:  Procedure Laterality Date  . FINGER SURGERY    . HERNIA REPAIR     Inguinal Hernia Repair    Family History  Problem Relation Age of Onset  . Bladder Cancer Father   . Cancer Paternal Aunt     stomach  . Arthritis      Parent  . Hyperlipidemia      Parent  . Heart disease      Parent  . Hypertension      Parent  . Kidney disease      Parent    Social History:  reports that he quit smoking about 6 weeks ago. His smoking use included Cigarettes. He smoked 0.50 packs per day. He has never used smokeless tobacco. He reports that he drinks alcohol. He reports that he does not use drugs.  Allergies:  Allergies  Allergen Reactions  . Chantix [Varenicline] Nausea Only    Medications:  I have reviewed the patient's current medications. Prior to Admission:  Prescriptions Prior to Admission  Medication Sig Dispense Refill Last Dose  . albuterol (PROVENTIL HFA;VENTOLIN HFA) 108 (90 Base) MCG/ACT inhaler Inhale 2 puffs into the lungs every 4 (four) hours as needed for wheezing or shortness of breath. 1 Inhaler 2 08/20/2015 at Unknown time  . fluticasone (FLOVENT HFA) 110 MCG/ACT inhaler Inhale 2 puffs into the lungs 2 (two) times daily.    Taking  . Glycopyrrolate-Formoterol (BEVESPI AEROSPHERE) 9-4.8 MCG/ACT AERO Inhale 2 puffs into the lungs 2 (two) times daily. 4.8 Inhaler 3 08/20/2015 at Unknown time  . loratadine (CLARITIN) 10 MG tablet Take 10 mg by mouth daily.   08/20/2015 at Unknown time  . mupirocin nasal ointment (BACTROBAN NASAL) 2 % Place 1 application into the nose 2 (two) times daily. One-half of tube in each nostril twice daily for five days. 10 g 0 08/20/2015 at Unknown time  . nicotine (NICODERM CQ - DOSED IN MG/24 HOURS) 21 mg/24hr patch Place 21 mg onto the skin daily.   08/19/2015 at Unknown time  . pantoprazole (PROTONIX) 40 MG tablet TAKE 1 TABLET (40 MG TOTAL) BY MOUTH 2 (TWO) TIMES DAILY. 60 tablet 3 Taking  . buPROPion (WELLBUTRIN) 75 MG tablet Please take 150 mg (2 tablets) daily for 3 days, then increase to 150 mg (2 tablets) twice daily. (Patient not taking: Reported on 08/17/2015) 120 tablet 1 08/06/2015 at Unknown time  . [DISCONTINUED] meloxicam (MOBIC) 15 MG tablet Take 1 tablet (15 mg total) by mouth daily. (Patient not taking: Reported on 08/17/2015) 30 tablet 1 07/20/2015 at Unknown time   Scheduled: . albuterol  2.5 mg Nebulization Q4H while awake  . antiseptic oral rinse  7 mL Mouth Rinse BID  . bisacodyl  10 mg  Oral Daily  . buPROPion  75 mg Oral BID  . cefUROXime (ZINACEF)  IV  1.5 g Intravenous Q12H  . famotidine (PEPCID) IV  20 mg Intravenous Q12H  . mupirocin nasal ointment  1 application Nasal BID  . nicotine  21 mg Transdermal Daily  . pantoprazole  40 mg Oral Daily  . traMADol  50 mg Oral Q6H   EHM:[id number] **OR** diphenhydrAMINE, meperidine (DEMEROL) injection, morphine injection, nalbuphine **OR** nalbuphine, nalbuphine **OR** nalbuphine, naLOXone (NARCAN) adult infusion for PRURITIS, naloxone **AND** sodium chloride flush, ondansetron (ZOFRAN) IV  Results for orders placed or performed during the hospital encounter of 08/20/15 (from the past 48 hour(s))  Prepare RBC (crossmatch)      Status: None   Collection Time: 08/20/15  7:00 AM  Result Value Ref Range   Order Confirmation ORDER PROCESSED BY BLOOD BANK   Glucose, capillary     Status: Abnormal   Collection Time: 08/20/15  3:27 PM  Result Value Ref Range   Glucose-Capillary 167 (H) 65 - 99 mg/dL  CBC     Status: Abnormal   Collection Time: 08/20/15  3:39 PM  Result Value Ref Range   WBC 16.2 (H) 3.8 - 10.6 K/uL   RBC 5.16 4.40 - 5.90 MIL/uL   Hemoglobin 16.0 13.0 - 18.0 g/dL   HCT 46.7 40.0 - 52.0 %   MCV 90.5 80.0 - 100.0 fL   MCH 31.1 26.0 - 34.0 pg   MCHC 34.4 32.0 - 36.0 g/dL   RDW 12.7 11.5 - 14.5 %   Platelets 197 150 - 440 K/uL  Basic metabolic panel     Status: Abnormal   Collection Time: 08/20/15  3:39 PM  Result Value Ref Range   Sodium 134 (L) 135 - 145 mmol/L   Potassium 4.3 3.5 - 5.1 mmol/L   Chloride 101 101 - 111 mmol/L   CO2 26 22 - 32 mmol/L   Glucose, Bld 144 (H) 65 - 99 mg/dL   BUN 11 6 - 20 mg/dL   Creatinine, Ser 0.62 0.61 - 1.24 mg/dL   Calcium 8.5 (L) 8.9 - 10.3 mg/dL   GFR calc non Af Amer >60 >60 mL/min   GFR calc Af Amer >60 >60 mL/min    Comment: (NOTE) The eGFR has been calculated using the CKD EPI equation. This calculation has not been validated in all clinical situations. eGFR's persistently <60 mL/min signify possible Chronic Kidney Disease.    Anion gap 7 5 - 15    Dg Chest Port 1 View  Result Date: 08/20/2015 CLINICAL DATA:  Status post surgical removal of a right upper lobe lung nodule. EXAM: PORTABLE CHEST 1 VIEW COMPARISON:  07/27/2015. FINDINGS: Interval surgical staples in the lateral aspect of the right upper lobe. The previously seen right upper lobe nodule is no longer demonstrated. There is an approximately 15% loculated right upper pneumothorax and a small amount of subcutaneous air. A right chest tube is in place. Interval small amount of linear atelectasis in the left mid to upper lung zone. The interstitial markings are mildly prominent. Normal sized  heart. Unremarkable bones. IMPRESSION: 1. Interval right upper lobe lung nodule resection with an approximately 15% right upper pneumothorax. 2. Interval mild prominence of the interstitial markings, most likely due to crowding as a result of a decreased depth of inspiration. These results will be called to the ordering clinician or representative by the Radiologist Assistant, and communication documented in the PACS or zVision Dashboard. Electronically Signed   By:  Claudie Revering M.D.   On: 08/20/2015 14:30    Review of Systems  Constitutional: Negative for chills and fever.  HENT: Negative for sore throat and tinnitus.   Eyes: Negative for blurred vision and redness.  Respiratory: Negative for cough and shortness of breath.   Cardiovascular: Negative for chest pain, palpitations, orthopnea and PND.  Gastrointestinal: Negative for abdominal pain, diarrhea, nausea and vomiting.  Genitourinary: Negative for dysuria, frequency and urgency.  Musculoskeletal: Negative for joint pain and myalgias.       Right shoulder pain  Skin: Negative for rash.       No lesions  Neurological: Negative for speech change, focal weakness and weakness.  Endo/Heme/Allergies: Does not bruise/bleed easily.       No temperature intolerance  Psychiatric/Behavioral: Negative for depression and suicidal ideas.   Blood pressure 100/74, pulse 67, temperature 97.6 F (36.4 C), temperature source Oral, resp. rate 11, height _0  (1.753 m), weight 89.7 kg (197 lb 12 oz), SpO2 96 %. Physical Exam  Constitutional: He is oriented to person, place, and time. He appears well-developed and well-nourished. No distress.  HENT:  Head: Normocephalic and atraumatic.  Mouth/Throat: Oropharynx is clear and moist.  Eyes: Conjunctivae and EOM are normal. Pupils are equal, round, and reactive to light. No scleral icterus.  Neck: Normal range of motion. Neck supple. No JVD present. No tracheal deviation present. No thyromegaly present.   Cardiovascular: Normal rate, regular rhythm and normal heart sounds.  Exam reveals no gallop and no friction rub.   No murmur heard. Respiratory: Effort normal. No respiratory distress. He has decreased breath sounds in the right upper field.  GI: Soft. Bowel sounds are normal. He exhibits no distension. There is no tenderness.  Genitourinary:  Genitourinary Comments: Urinary catheter in place  Musculoskeletal: Normal range of motion. He exhibits no edema.  Lymphadenopathy:    He has no cervical adenopathy.  Neurological: He is oriented to person, place, and time. No cranial nerve deficit.  Groggy/sedated  Skin: Skin is warm and dry. No rash noted. No erythema.    Assessment/Plan: This is a 45 year old male status post right upper lobectomy seen for medical management. 1. Lung nodule: Status post thoracotomy; chest tube draining serosanguineous fluid well. Careful with pain management. The patient is on a fentanyl epidural, Nubain for itching, morphine for breakthrough pain, and tramadol for moderate pain. Demerol is also been ordered for shivering. Routinely assess respiratory status. Narcan if needed. Pathology pending. Await biopsy results. 2. COPD: Albuterol as needed. Restart inhaled corticosteroid when okay by CT surgery. Pneumothorax present as expected following thoracotomy. 3. Tobacco abuse: NicoDerm patch 4. CAD: Per CT scan of the chest last month the patient had significant calcifications of the LAD. Stress test prior to surgery negative for coronary ischemia. Start aspirin and cleared by surgery.   5. DVT prophylaxis: SCDs 6. GI prophylaxis: Pantoprazole Thank you for involving [place] in the care of this patient. We will continue to follow.  Harrie Foreman 08/21/2015, 2:58 AM

## 2015-08-21 NOTE — Progress Notes (Signed)
PT Cancellation Note  Patient Details Name: Jimmy Howell MRN: [id number] DOB: 1970/12/03   Cancelled Treatment:    Reason Eval/Treat Not Completed: Pain limiting ability to participate;Medical issues which prohibited therapy;Other (comment) Consult received, chart reviewed. Per RN, pt just received new epidural with decreased sensation noted. Pt not appropriate for PT evaluation right now, will re-attempt at later date when medically stable. New airborne precautions noted.   Thereasa ParkinShagun Kamille Toomey 08/21/2015, 2:21 PM Thereasa ParkinShagun Tacoya Altizer, SPT [phone]

## 2015-08-21 NOTE — Progress Notes (Signed)
PULMONARY / CRITICAL CARE MEDICINE   Name: Jimmy OverlieJeffrey L Wilhelmsen MRN: [id number] DOB: 09-16-1970    ADMISSION DATE:  08/20/2015 Referring MD - Dr. Thelma Bargeaks Reason for consult - COPD management   BRIEF HISTORY: 45 yo male with a PMH of COPD, tobacco abuse, shortness of breath, asthma, GERD, and  restless leg syndrome.  [facility] for a preoperative bronchoscopy to assess endobronchial anatomy and right thoracotomy and right upper lobectomy due to CT of chest results on 07/13/2015 indicating an increase in size of right upper lobe lung nodule appearing partially cavitary concerning for primary bronchogenic carcinoma when compared to previous CT of chest findings on 04/03/2015.  PCCM consulted 8/14 for management of COPD.  SUBJECTIVE:  Doing well overnight, now on RA, still with R check discomfort. Family at bedside. R CT drained about 80cc of mild serosangious fluid over night.    SIGNIFICANT EVENTS: 8/14>>RUL lobectomy and thoracotomy s/p CT placement for PTX  VITAL SIGNS: Temp:  [97.1 F (36.2 C)-98.5 F (36.9 C)] 98.5 F (36.9 C) (08/15 0400) Pulse Rate:  [67-86] 80 (08/15 0600) Resp:  [9-24] 16 (08/15 0600) BP: (100-149)/(70-98) 125/77 (08/15 0600) SpO2:  [94 %-100 %] 95 % (08/15 0735) Weight:  [197 lb 12 oz (89.7 kg)] 197 lb 12 oz (89.7 kg) (08/14 1522) HEMODYNAMICS:   VENTILATOR SETTINGS:   INTAKE / OUTPUT:  Intake/Output Summary (Last 24 hours) at 08/21/15 0947 Last data filed at 08/21/15 0500  Gross per 24 hour  Intake          3281.13 ml  Output             2928 ml  Net           353.13 ml    Review of Systems  Constitutional: Negative for chills and fever.  Eyes: Negative for blurred vision and double vision.  Respiratory: Positive for cough and shortness of breath.   Cardiovascular: Negative for chest pain.  Gastrointestinal: Negative for nausea and vomiting.  Genitourinary: Negative for dysuria.  Musculoskeletal: Negative for myalgias.  Skin: Negative  for rash.  Neurological: Negative for dizziness and headaches.  Endo/Heme/Allergies: Does not bruise/bleed easily.  Psychiatric/Behavioral: Negative for depression.    Physical Exam  Nursing note and vitals reviewed.    LABS:  CBC  Recent Labs Lab 08/20/15 1539  WBC 16.2*  HGB 16.0  HCT 46.7  PLT 197   Coag's No results for input(s): APTT, INR in the last 168 hours. BMET  Recent Labs Lab 08/20/15 1539  NA 134*  K 4.3  CL 101  CO2 26  BUN 11  CREATININE 0.62  GLUCOSE 144*   Electrolytes  Recent Labs Lab 08/20/15 1539  CALCIUM 8.5*   Sepsis Markers No results for input(s): LATICACIDVEN, PROCALCITON, O2SATVEN in the last 168 hours. ABG No results for input(s): PHART, PCO2ART, PO2ART in the last 168 hours. Liver Enzymes No results for input(s): AST, ALT, ALKPHOS, BILITOT, ALBUMIN in the last 168 hours. Cardiac Enzymes No results for input(s): TROPONINI, PROBNP in the last 168 hours. Glucose  Recent Labs Lab 08/20/15 1527  GLUCAP 167*    Imaging Dg Chest Port 1 View  Result Date: 08/21/2015 CLINICAL DATA:  Post thoracotomy EXAM: PORTABLE CHEST 1 VIEW COMPARISON:  Portable exam 0542 hours compared 08/20/2015 FINDINGS: RIGHT thoracostomy tube stable. Normal heart size and pulmonary vascularity. Prominence of RIGHT hilum likely related to atelectasis and postsurgical changes. Staple lines in RIGHT upper lobe. Improved aeration since previous study.  No definite infiltrate, pleural effusion or pneumothorax. Small amount of chest wall emphysema on RIGHT extending into RIGHT cervical region. New line bones demineralized. IMPRESSION: Postsurgical changes of the RIGHT hemithorax without definite pneumothorax. Electronically Signed   By: Ulyses SouthwardMark  Boles M.D.   On: 08/21/2015 07:30   Dg Chest Port 1 View  Result Date: 08/20/2015 CLINICAL DATA:  Status post surgical removal of a right upper lobe lung nodule. EXAM: PORTABLE CHEST 1 VIEW COMPARISON:  07/27/2015. FINDINGS:  Interval surgical staples in the lateral aspect of the right upper lobe. The previously seen right upper lobe nodule is no longer demonstrated. There is an approximately 15% loculated right upper pneumothorax and a small amount of subcutaneous air. A right chest tube is in place. Interval small amount of linear atelectasis in the left mid to upper lung zone. The interstitial markings are mildly prominent. Normal sized heart. Unremarkable bones. IMPRESSION: 1. Interval right upper lobe lung nodule resection with an approximately 15% right upper pneumothorax. 2. Interval mild prominence of the interstitial markings, most likely due to crowding as a result of a decreased depth of inspiration. These results will be called to the ordering clinician or representative by the Radiologist Assistant, and communication documented in the PACS or zVision Dashboard. Electronically Signed   By: Beckie SaltsSteven  Reid M.D.   On: 08/20/2015 14:30    STUDIES:  CT of Chest 07/13/15>>There has been increase in size of the right upper lobe lung nodule which now appears partially cavitary. Findings are highly concerning for primary bronchogenic carcinoma. Recommend thoracic surgery consultation and or biopsy. The other small nodules have remained unchanged. Diffuse bronchial wall thickening with emphysema, as above; imaging findings suggestive of underlying COPD. LAD coronary artery calcification.  CULTURES: None  ANTIBIOTICS: Cefuroxime 8/14>>  SIGNIFICANT EVENTS: 8/14- [facility] and right lobectomy admitted to ICU and PCCM consulted for management of COPD  LINES/TUBES: Right pleural chest tube 8/14>> PIV's 8/14>> Epidural Catheter 8/14>>  ASSESSMENT / PLAN: 45 yo smoker, with RUL nodule, now s/p RUL lobectomy with R Chest tube placement  RUL nodule s/p RUL lobectomy S/P thoracotomy  R PTX R Pleural effusion COPD Cigarette User GERD  P: -CXR reviewed, resolving PTX with decreased  CT output, CTS following - cont with pain management.  - no inhaled steroids at this time, scheduled Duonebs over the next 2 days - maintain O2 sats >88% - restart home inhalers closer to discharge.   Thank you for consulting [facility], Please feel free to contacts us with any questions at [phone] (please enter 7-digits).  I have personally obtained a history, examined the patient, evaluated laboratory and imaging results, formulated the assessment and plan and placed orders.  Pulmonary Care Time devoted to patient care services described in this note is 35 minutes.    Stephanie AcreVishal Maryl Blalock, MD [facility] Pager [phone]- [phone] (please enter 7-digits) On Call Pager - [phone] (please enter 7-digits)  Note: This note was prepared with Dragon dictation along with smaller phrase technology. Any transcriptional errors that result from this process are unintentional.

## 2015-08-21 NOTE — Progress Notes (Signed)
Pain is still an issue.  Mostly in shoulder.  Not short of breath.  Wounds look good.  Small air leak.  Serous chest tube drainage.  CXRay from this morning looks good  Will transfer to floor. Discontinue epidural and begin PCA Increase Tramadol Discontinue Foley

## 2015-08-21 NOTE — Progress Notes (Signed)
MD Penwarden assessed patient at bedside and replaced eidural. MD Penwarden stated to start epidural medication and to leave PCA going. Will continue to monitor patient.

## 2015-08-22 ENCOUNTER — Encounter: Payer: Self-pay | Admitting: Certified Registered"

## 2015-08-22 DIAGNOSIS — J8410 Pulmonary fibrosis, unspecified: Principal | ICD-10-CM

## 2015-08-22 MED ORDER — ALBUTEROL SULFATE (2.5 MG/3ML) 0.083% IN NEBU
2.5000 mg | INHALATION_SOLUTION | Freq: Four times a day (QID) | RESPIRATORY_TRACT | Status: DC | PRN
Start: 2015-08-22 — End: 2015-08-27
  Administered 2015-08-22 – 2015-08-23 (×3): 2.5 mg via RESPIRATORY_TRACT
  Filled 2015-08-22 (×3): qty 3

## 2015-08-22 MED ORDER — MORPHINE SULFATE (PF) 2 MG/ML IV SOLN
1.0000 mg | INTRAVENOUS | Status: DC | PRN
  Administered 2015-08-22 – 2015-08-24 (×11): 2 mg via INTRAVENOUS
  Filled 2015-08-22 (×11): qty 1

## 2015-08-22 NOTE — Progress Notes (Signed)
[facility] [place] [facility]   PATIENT NAME: Jimmy EvesJeffrey Howell    MR#:  [id number]  DATE OF BIRTH:  1970/10/30  SUBJECTIVE:  Pain at the surgical site. Epidural readjusted at different location Looks and feels much better  REVIEW OF SYSTEMS:   Review of Systems  Constitutional: Negative for chills, fever and weight loss.  HENT: Negative for ear discharge, ear pain and nosebleeds.   Eyes: Negative for blurred vision, pain and discharge.  Respiratory: Negative for sputum production, shortness of breath, wheezing and stridor.   Cardiovascular: Positive for chest pain. Negative for palpitations, orthopnea and PND.  Gastrointestinal: Negative for abdominal pain, diarrhea, nausea and vomiting.  Genitourinary: Negative for frequency and urgency.  Musculoskeletal: Positive for back pain. Negative for joint pain.  Neurological: Positive for weakness. Negative for sensory change, speech change and focal weakness.  Psychiatric/Behavioral: Negative for depression and hallucinations. The patient is not nervous/anxious.   All other systems reviewed and are negative.  Tolerating Diet: some Tolerating PT: not needed  DRUG ALLERGIES:   Allergies  Allergen Reactions  . Chantix [Varenicline] Nausea Only    VITALS:  Blood pressure (!) 149/93, pulse 90, temperature 97.8 F (36.6 C), resp. rate 20, height 5\' 9"  (1.753 m), weight 197 lb 12 oz (89.7 kg), SpO2 91 %.  PHYSICAL EXAMINATION:   Physical Exam  GENERAL:  45 y.o.-year-old patient lying in the bed with no acute distress.  EYES: Pupils equal, round, reactive to light and accommodation. No scleral icterus. Extraocular muscles intact.  HEENT: Head atraumatic, normocephalic. Oropharynx and nasopharynx clear.  NECK:  Supple, no jugular venous distention. No thyroid enlargement, no tenderness.  LUNGS: Normal breath sounds bilaterally, no wheezing, rales, rhonchi. No use of accessory muscles of respiration. Right  CT + CARDIOVASCULAR: S1, S2 normal. No murmurs, rubs, or gallops.  ABDOMEN: Soft, nontender, nondistended. Bowel sounds present. No organomegaly or mass.  EXTREMITIES: No cyanosis, clubbing or edema b/l.    NEUROLOGIC: Cranial nerves II through XII are intact. No focal Motor or sensory deficits b/l.   PSYCHIATRIC:  patient is alert and oriented x 3.  SKIN: No obvious rash, lesion, or ulcer.   LABORATORY PANEL:  CBC  Recent Labs Lab 08/20/15 1539  WBC 16.2*  HGB 16.0  HCT 46.7  PLT 197    Chemistries   Recent Labs Lab 08/20/15 1539  NA 134*  K 4.3  CL 101  CO2 26  GLUCOSE 144*  BUN 11  CREATININE 0.62  CALCIUM 8.5*   Cardiac Enzymes No results for input(s): TROPONINI in the last 168 hours. RADIOLOGY:  Dg Chest Port 1 View  Result Date: 08/21/2015 CLINICAL DATA:  Post thoracotomy EXAM: PORTABLE CHEST 1 VIEW COMPARISON:  Portable exam 0542 hours compared 08/20/2015 FINDINGS: RIGHT thoracostomy tube stable. Normal heart size and pulmonary vascularity. Prominence of RIGHT hilum likely related to atelectasis and postsurgical changes. Staple lines in RIGHT upper lobe. Improved aeration since previous study. No definite infiltrate, pleural effusion or pneumothorax. Small amount of chest wall emphysema on RIGHT extending into RIGHT cervical region. New line bones demineralized. IMPRESSION: Postsurgical changes of the RIGHT hemithorax without definite pneumothorax. Electronically Signed   By: Ulyses SouthwardMark  Boles M.D.   On: 08/21/2015 07:30   ASSESSMENT AND PLAN:  45 year old male status post right upper lobectomy seen for medical management.  1. Lung nodule: Status post thoracotomy; chest tube draining serosanguineous fluid well. -cont pain management with Epidural PCA per anesthesia - prn tramadol for moderate pain.  -  Pathology AFB+, caseating granuloma.  AFB culture and fungal serologies sent -sats >92% on 2 liter Followed by ID  2. COPD: Albuterol as needed. Restart inhaled  corticosteroid when okay by CT surgery. Pneumothorax present as expected following thoracotomy.  3. Tobacco abuse: NicoDerm patch  4. CAD: Per CT scan of the chest last month the patient had significant calcifications of the LAD. Stress test prior to surgery negative for coronary ischemia. Start aspirin and cleared by surgery.    5. DVT prophylaxis: SCDs  6. GI prophylaxis: Pantoprazole  Case discussed with Care Management/Social Worker. Management plans discussed with the patient, family and they are in agreement.  CODE STATUS: full  DVT Prophylaxis:lovenox  TOTAL TIME TAKING CARE OF THIS PATIENT: 30 minutes.  >50% time spent on counselling and coordination of care  POSSIBLE D/C IN 1-2 DAYS, DEPENDING ON CLINICAL CONDITION.  Note: This dictation was prepared with Dragon dictation along with smaller phrase technology. Any transcriptional errors that result from this process are unintentional.  Madisun Hargrove M.D on 08/22/2015 at 8:07 PM  Between 7am to 6pm - Pager - [phone]  After 6pm go to [email or web address] - password [email or web address] [facility]  [facility]  Office  [phone]  CC: Primary care physician; Marikay AlarEric Sonnenberg, MD

## 2015-08-22 NOTE — Evaluation (Signed)
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 Pt is now s/p R thoracotomy and R upper lobectomy on 08/20/15. Pt with current epidural and PCA for pain control. Pt with history of COPD, SOB, asthma, and GERD.   Clinical Impression  Pt is a pleasant 45 year old male who was admitted for R lung mass and is s/p R thoracotomy/lobectomy. Pt demonstrates all bed mobility/transfers/ambulation at baseline level. Limited mobility secondary to lines/leads. All sensation intact prior to mobility. Per RN and pt, pt completed full lap around RN station prior to PT arrival. Pt does not require any further PT needs at this time. Pt will be dc in house and does not require follow up. RN aware. Recommend SPC for pain control following discharge if pain limits mobility. Will dc current orders.      Follow Up Recommendations No PT follow up    Equipment Recommendations  None recommended by PT    Recommendations for Other Services       Precautions / Restrictions Precautions Precautions: Fall Restrictions Weight Bearing Restrictions: No      Mobility  Bed Mobility Overal bed mobility: Independent             General bed mobility comments: assist for line management  Transfers Overall transfer level: Independent Equipment used: None             General transfer comment: safe technique performed  Ambulation/Gait Ambulation/Gait assistance: Independent Ambulation Distance (Feet): 2 Feet Assistive device: None Gait Pattern/deviations: Step-to pattern     General Gait Details: ambulation limited to beside secondary to epidural and chest tube. Pt able to march in place confidently without assistance. Dymanic balance challendged with marching with safe technique.   Stairs            Wheelchair Mobility     Modified Rankin (Stroke Patients Only)       Balance Overall balance assessment: Independent (no LOB with head turns)                                           Pertinent Vitals/Pain Pain Assessment: Faces Faces Pain Scale: Hurts little more Pain Location: R chest Pain Descriptors / Indicators: Operative site guarding Pain Intervention(s): Limited activity within patient's tolerance;PCA encouraged    Home Living Family/patient expects to be discharged to:: Private residence Living Arrangements: Spouse/significant other   Type of Home: House Home Access: Level entry     Home Layout: One level Home Equipment: Crutches;Cane - single point (available via family)      Prior Function Level of Independence: Independent               Hand Dominance        Extremity/Trunk Assessment   Upper Extremity Assessment: Overall WFL for tasks assessed           Lower Extremity Assessment: Overall WFL for tasks assessed         Communication   Communication: No difficulties  Cognition Arousal/Alertness: Awake/alert Behavior During Therapy: WFL for tasks assessed/performed Overall Cognitive Status: Within Functional Limits for tasks assessed                      General Comments  Exercises        Assessment/Plan    PT Assessment Patent does not need any further PT services  PT Diagnosis Acute pain   PT Problem List    PT Treatment Interventions     PT Goals (Current goals can be found in the Care Plan section) Acute Rehab PT Goals Patient Stated Goal: to go home PT Goal Formulation: All assessment and education complete, DC therapy Time For Goal Achievement: 08/22/15 Potential to Achieve Goals: Good    Frequency     Barriers to discharge        Co-evaluation               End of Session Equipment Utilized During Treatment: Oxygen Activity Tolerance: Patient tolerated treatment well Patient left: in bed  (family present) Nurse Communication: Mobility status         Time: 1044-1100 PT Time Calculation (min) (ACUTE ONLY): 16 min   Charges:   PT Evaluation $PT Eval High Complexity: 1 Procedure     PT G Codes:        Roby Spalla 08/22/2015, 11:20 AM  Elizabeth PalauStephanie Crystina Borrayo, PT, DPT [phone]

## 2015-08-22 NOTE — Progress Notes (Signed)
[facility] INFECTIOUS DISEASE PROGRESS NOTE Date of Admission:  08/20/2015     ID: Jimmy Howell is a 45 y.o. 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 Active Problems:   Lung mass   Nodule of right lung   Pleural effusion   Pneumothorax, traumatic   Centrilobular emphysema (HCC)   Cigarette smoker   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HCC)   Subjective: Out of unit, no fevers, vs stable  ROS  Eleven systems are reviewed and negative except per hpi  Medications:  Antibiotics Given (last 72 hours)    Date/Time Action Medication Dose Rate   08/20/15 1400 Given   cefUROXime (ZINACEF) 1.5 g in dextrose 5 % 50 mL IVPB 1.5 g 100 mL/hr   08/21/15 0205 Given   cefUROXime (ZINACEF) 1.5 g in dextrose 5 % 50 mL IVPB 1.5 g 100 mL/hr   08/21/15 1435 Given   cefUROXime (ZINACEF) IVPB 1.5 g 1.5 g 100 mL/hr     . antiseptic oral rinse  7 mL Mouth Rinse BID  . bisacodyl  10 mg Oral Daily  . buPROPion  75 mg Oral BID  . mupirocin nasal ointment  1 application Nasal BID  . nicotine  21 mg Transdermal Daily  . pantoprazole  40 mg Oral Daily  . traMADol  100 mg Oral Q6H    Objective: Vital signs in last 24 hours: Temp:  [97.8 F (36.6 C)-98.5 F (36.9 C)] 98.3 F (36.8 C) (08/16 1248) Pulse Rate:  [77-97] 82 (08/16 1248) Resp:  [12-21] 18 (08/16 1248) BP: (118-158)/(75-97) 143/82 (08/16 1248) SpO2:  [91 %-100 %] 100 % (08/16 1248) FiO2 (%):  [28 %] 28 % (08/16 0943) Constitutional: He is oriented to person, place, and time. He appears well-developed and well-nourished. No distress. Ruddy complexion on face HENT: anicteric Mouth/Throat: Oropharynx is clear and moist. No oropharyngeal exudate.  Cardiovascular: Normal rate, regular rhythm and normal heart sounds.  Pulmonary/Chest: Chest tube with ss drainage, rhonchi R side Abdominal: Soft. Bowel sounds are normal. He exhibits no distension. There is no tenderness.  Lymphadenopathy: He has no cervical adenopathy.   Neurological: He is alert and oriented to person, place, and time.  Skin: Skin is warm and dry. No rash noted. No erythema.  Psychiatric: He has a normal mood and affect. His behavior is normal.   Lab Results  Recent Labs  08/20/15 1539  WBC 16.2*  HGB 16.0  HCT 46.7  NA 134*  K 4.3  CL 101  CO2 26  BUN 11  CREATININE 0.62    Microbiology: Results for orders placed or performed during the hospital encounter of 08/17/15  Surgical pcr screen     Status: Abnormal   Collection Time: 08/17/15  9:31 AM  Result Value Ref Range Status   MRSA, PCR NEGATIVE NEGATIVE Final   Staphylococcus aureus POSITIVE (A) NEGATIVE Final    Comment:        The Xpert SA Assay (FDA approved for NASAL specimens in patients over 45 years of age), is one component of a comprehensive surveillance program.  Test performance has been validated by [facility] for patients greater than or equal to 45 year old. It is not intended to diagnose infection nor to guide or monitor treatment.    Path A. LYMPH NODE, R6 PARA-AORTIC; EXCISION:  - ONE LYMPH NODE, NEGATIVE FOR MALIGNANCY (0/1).   B. LYMPH NODE, R4 LOWER PARATRACHEAL; EXCISION:  - ONE LYMPH NODE, NEGATIVE FOR MALIGNANCY (0/1).   C.  LYMPH NODE, R 11 INTRALOBAR; EXCISION:  - ONE LYMPH NODE, NEGATIVE FOR MALIGNANCY (0/1).   D. LUNG, RIGHT UPPER LOBE; LUMPECTOMY:  - CASEATING GRANULOMATOUS INFLAMMATION WITH AFB POSITIVE ORGANISMS.  - ADJACENT LUNG PARENCHYMA WITH ATELECTASIS AND EMPHASEMATOUS CHANGE.  - NINE BENIGN LYMPH NODES (0/9).  - NEGATIVE FOR MALIGNANCY.   Studies/Results: Dg Chest Port 1 View  Result Date: 08/21/2015 CLINICAL DATA:  Post thoracotomy EXAM: PORTABLE CHEST 1 VIEW COMPARISON:  Portable exam 0542 hours compared 08/20/2015 FINDINGS: RIGHT thoracostomy tube stable. Normal heart size and pulmonary vascularity. Prominence of RIGHT hilum likely related to atelectasis and postsurgical changes. Staple lines in RIGHT upper lobe.  Improved aeration since previous study. No definite infiltrate, pleural effusion or pneumothorax. Small amount of chest wall emphysema on RIGHT extending into RIGHT cervical region. New line bones demineralized. IMPRESSION: Postsurgical changes of the RIGHT hemithorax without definite pneumothorax. Electronically Signed   By: Ulyses SouthwardMark  Boles M.D.   On: 08/21/2015 07:30    Assessment/Plan: Jimmy Howell is a 45 y.o. male with long heavy smoking hx and mod COPD now s.p resection of RUL lung mass progressively increasing in size from January.  He also has a granuloma noted on CT in the liver.  He has no systemic symptoms of fevers, wt loss, night sweats or cough. No TB exposure. 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   Recommendations AFB pending Will need to await ID of AFB on path before initiating treatment- will take several weeks Thank you very much for the consult. Will follow with you.  Kilani Joffe P   08/22/2015, 2:25 PM

## 2015-08-22 NOTE — Anesthesia Post-op Follow-up Note (Signed)
  Anesthesia Pain Follow-up Note  Patient: Jimmy Howell  Day #: 2  Date of Follow-up: 08/22/2015 Time: 9:00 AM  Last Vitals:  Vitals:   08/22/15 0700 08/22/15 0841  BP: 134/81 134/81  Pulse: 89 89  Resp: 18 18  Temp: 36.9 C 36.9 C    Level of Consciousness: alert  Pain: mild   Side Effects:None   Catheter Site Exam:clean, dry  Epidural / Intrathecal    Start     Dose/Rate Route Frequency Ordered Stop   08/21/15 1345  fentaNYL 2.5 mcg/ml w/ropivacaine 0.2% (preservative free) in normal saline 100 mL EPIDURAL Infusion in 150 ml Intravia Bag     8 mL/hr 8 mL/hr  Epidural Continuous 08/21/15 1334         Plan: Continue current therapy  Clydene PughBeane, [name] D

## 2015-08-22 NOTE — Anesthesia Post-op Follow-up Note (Signed)
  Anesthesia Pain Follow-up Note  Patient: Jimmy Howell  Day #: 2  Date of Follow-up: 08/22/2015 Time: 8:52 AM  Last Vitals:  Vitals:   08/22/15 0700 08/22/15 0841  BP: 134/81 134/81  Pulse: 89 89  Resp: 18 18  Temp: 36.9 C 36.9 C    Level of Consciousness: alert  Pain: 2 /10   Side Effects:None  Catheter Site Exam:clean, dry  Epidural / Intrathecal    Start     Dose/Rate Route Frequency Ordered Stop   08/21/15 1345  fentaNYL 2.5 mcg/ml w/ropivacaine 0.2% (preservative free) in normal saline 100 mL EPIDURAL Infusion in 150 ml Intravia Bag     8 mL/hr 8 mL/hr  Epidural Continuous 08/21/15 1334         Plan: Continue current therapy Pain well controlled with epidural in combination with morphine PCA (minimal usage).   Ok to start SQ heparin 5000 TID. No lovenox while epidural is in place. Will need to hold heparin for 8h prior to epidural removal.   Karnisha Lefebre

## 2015-08-22 NOTE — Progress Notes (Signed)
PULMONARY / CRITICAL CARE MEDICINE   Name: Jimmy Howell MRN: [id number] DOB: 1970-07-05    ADMISSION DATE:  08/20/2015 Referring MD - Dr. Thelma Bargeaks Reason for consult - COPD management   BRIEF HISTORY: 45 yo male with a PMH of COPD, tobacco abuse, shortness of breath, asthma, GERD, and  restless leg syndrome.  He [facility]/14 for a preoperative bronchoscopy to assess endobronchial anatomy and right thoracotomy and right upper lobectomy due to CT of chest results on 07/13/2015 indicating an increase in size of right upper lobe lung nodule appearing partially cavitary concerning for primary bronchogenic carcinoma when compared to previous CT of chest findings on 04/03/2015.  PCCM consulted 8/14 for management of COPD.  SUBJECTIVE:  Doing well overnight, now on RA, still with R check discomfort. Family at bedside. R CT drained about 80cc of mild serosangious fluid over night.    SIGNIFICANT EVENTS: 8/14>>RUL lobectomy and thoracotomy s/p CT placement for PTX  VITAL SIGNS: Temp:  [97.8 F (36.6 C)-98.5 F (36.9 C)] 98.5 F (36.9 C) (08/16 0841) Pulse Rate:  [77-97] 89 (08/16 0841) Resp:  [12-24] 18 (08/16 0841) BP: (118-158)/(75-99) 134/81 (08/16 0841) SpO2:  [91 %-97 %] 95 % (08/16 0943) FiO2 (%):  [28 %] 28 % (08/16 0943) HEMODYNAMICS:   VENTILATOR SETTINGS: FiO2 (%):  [28 %] 28 % INTAKE / OUTPUT:  Intake/Output Summary (Last 24 hours) at 08/22/15 1244 Last data filed at 08/22/15 1100  Gross per 24 hour  Intake          1906.63 ml  Output             1505 ml  Net           401.63 ml    Review of Systems  Constitutional: Negative for chills and fever.  Eyes: Negative for blurred vision and double vision.  Respiratory: Positive for cough and shortness of breath.   Cardiovascular: Negative for chest pain.  Gastrointestinal: Negative for nausea and vomiting.  Genitourinary: Negative for dysuria.  Musculoskeletal: Negative for myalgias.  Skin: Negative for rash.   Neurological: Negative for dizziness and headaches.  Endo/Heme/Allergies: Does not bruise/bleed easily.  Psychiatric/Behavioral: Negative for depression.    Physical Exam  Nursing note and vitals reviewed.    LABS:  CBC  Recent Labs Lab 08/20/15 1539  WBC 16.2*  HGB 16.0  HCT 46.7  PLT 197   Coag's No results for input(s): APTT, INR in the last 168 hours. BMET  Recent Labs Lab 08/20/15 1539  NA 134*  K 4.3  CL 101  CO2 26  BUN 11  CREATININE 0.62  GLUCOSE 144*   Electrolytes  Recent Labs Lab 08/20/15 1539  CALCIUM 8.5*   Sepsis Markers No results for input(s): LATICACIDVEN, PROCALCITON, O2SATVEN in the last 168 hours. ABG No results for input(s): PHART, PCO2ART, PO2ART in the last 168 hours. Liver Enzymes No results for input(s): AST, ALT, ALKPHOS, BILITOT, ALBUMIN in the last 168 hours. Cardiac Enzymes No results for input(s): TROPONINI, PROBNP in the last 168 hours. Glucose  Recent Labs Lab 08/20/15 1527  GLUCAP 167*    Imaging No results found.  STUDIES:  CT of Chest 07/13/15>>There has been increase in size of the right upper lobe lung nodule which now appears partially cavitary. Findings are highly concerning for primary bronchogenic carcinoma. Recommend thoracic surgery consultation and or biopsy. The other small nodules have remained unchanged. Diffuse bronchial wall thickening with emphysema, as above; imaging findings suggestive of underlying  COPD. LAD coronary artery calcification.  CULTURES: None  ANTIBIOTICS: Cefuroxime 8/14>>  SIGNIFICANT EVENTS: 8/14- Pt [facility] for right thoracotomy and right lobectomy admitted to ICU and PCCM consulted for management of COPD  LINES/TUBES: Right pleural chest tube 8/14>> PIV's 8/14>> Epidural Catheter 8/14>>  ASSESSMENT / PLAN: 45 yo smoker, with RUL nodule, now s/p RUL lobectomy with R Chest tube placement  RUL nodule s/p RUL lobectomy S/P thoracotomy  R PTX R Pleural  effusion COPD Cigarette User GERD Caseating granuloma of the Lung  P: -CXR reviewed, resolving PTX with decreased CT output, CTS following - cont with pain management.  - PT and incentive spirometry regularly - no inhaled steroids at this time, scheduled Duonebs over the next 1 days - maintain O2 sats >88% - restart home inhalers closer to discharge.  - Path from lung nodules with caesating granuloma. ID following, AFB and fungal workup already in progress, low risk for TB,no need for antifungal or antimicrobial treatment and this time.  Await culture and AFB results.   Thank you for consulting [facility], Please feel free to contacts us with any questions at [phone] (please enter 7-digits).  I have personally obtained a history, examined the patient, evaluated laboratory and imaging results, formulated the assessment and plan and placed orders.  Pulmonary Care Time devoted to patient care services described in this note is 35 minutes.    Stephanie AcreVishal Aariya Ferrick, MD [facility] Pager [phone] (please enter 7-digits) On Call Pager - [phone] (please enter 7-digits)  Note: This note was prepared with Dragon dictation along with smaller phrase technology. Any transcriptional errors that result from this process are unintentional.

## 2015-08-22 NOTE — Progress Notes (Signed)
V/S taken at 8:41. Documented in error at 0700

## 2015-08-22 NOTE — Progress Notes (Signed)
Feels much better today.  Epidural is working well.  Not short of breath.  No fever.  Passing gas.  Walked with me today around nursing station and did very well.  VSS, Afebrile CT with small air leak Serous drainage Wounds redressed - clean and dry. Lungs decreased on right Heart regular Abdomen is soft and slightly distended  Will discontinue PCA Will encourage ambulation with PT Will check on Pathology Will discontinue IV fluids when eating well.  Jimmy Howell.

## 2015-08-22 NOTE — Progress Notes (Signed)
Dr. Karlton LemonKarenz called and gave verbal order to increase epidural drip to 638mL/hr. Will continue to assess patient's pain.

## 2015-08-22 NOTE — Addendum Note (Signed)
Addendum  created 08/22/15 [id number] by Alver FisherAmy Jerricka Carvey, MD   Sign clinical note

## 2015-08-23 ENCOUNTER — Inpatient Hospital Stay: Payer: 59

## 2015-08-23 MED ORDER — POLYETHYLENE GLYCOL 3350 17 G PO PACK
17.0000 g | PACK | Freq: Every day | ORAL | Status: DC
  Administered 2015-08-23 – 2015-08-25 (×3): 17 g via ORAL
  Filled 2015-08-23 (×4): qty 1

## 2015-08-23 NOTE — Anesthesia Post-op Follow-up Note (Cosign Needed)
  Anesthesia Pain Follow-up Note  Patient: Jimmy OverlieJeffrey L Howell  Day #: 3  Date of Follow-up: 08/23/2015 Time: 8:12 AM  Last Vitals:  Vitals:   08/23/15 0035 08/23/15 0409  BP: 138/83   Pulse: 85   Resp: 20 20  Temp: 36.8 C     Level of Consciousness: alert  Pain: mild   Side Effects:None  Catheter Site Exam:clean, dry, no drainage  Epidural / Intrathecal    Start     Dose/Rate Route Frequency Ordered Stop   08/21/15 1345  fentaNYL 2.5 mcg/ml w/ropivacaine 0.2% (preservative free) in normal saline 100 mL EPIDURAL Infusion in 150 ml Intravia Bag     8 mL/hr 8 mL/hr  Epidural Continuous 08/21/15 1334         Plan: Continue current therapy  Rica MastBachich,  Micholas Drumwright M

## 2015-08-23 NOTE — Progress Notes (Signed)
[facility] [place] [facility]   PATIENT NAME: Jimmy EvesJeffrey Howell    MR#:  [id number]  DATE OF BIRTH:  Sep 29, 1970  SUBJECTIVE:  Pain at the surgical site. Looks and feels much better  REVIEW OF SYSTEMS:   Review of Systems  Constitutional: Negative for chills, fever and weight loss.  HENT: Negative for ear discharge, ear pain and nosebleeds.   Eyes: Negative for blurred vision, pain and discharge.  Respiratory: Negative for sputum production, shortness of breath, wheezing and stridor.   Cardiovascular: Positive for chest pain. Negative for palpitations, orthopnea and PND.  Gastrointestinal: Negative for abdominal pain, diarrhea, nausea and vomiting.  Genitourinary: Negative for frequency and urgency.  Musculoskeletal: Positive for back pain. Negative for joint pain.  Neurological: Positive for weakness. Negative for sensory change, speech change and focal weakness.  Psychiatric/Behavioral: Negative for depression and hallucinations. The patient is not nervous/anxious.   All other systems reviewed and are negative.  Tolerating Diet: some Tolerating PT: not needed, ambulatory  DRUG ALLERGIES:   Allergies  Allergen Reactions  . Chantix [Varenicline] Nausea Only    VITALS:  Blood pressure 135/79, pulse 86, temperature 98.1 F (36.7 C), temperature source Oral, resp. rate 18, height 5\' 9"  (1.753 m), weight 197 lb 12 oz (89.7 kg), SpO2 94 %.  PHYSICAL EXAMINATION:   Physical Exam  GENERAL:  45 y.o.-year-old patient lying in the bed with no acute distress.  EYES: Pupils equal, round, reactive to light and accommodation. No scleral icterus. Extraocular muscles intact.  HEENT: Head atraumatic, normocephalic. Oropharynx and nasopharynx clear.  NECK:  Supple, no jugular venous distention. No thyroid enlargement, no tenderness.  LUNGS: Normal breath sounds bilaterally, no wheezing, rales, rhonchi. No use of accessory muscles of respiration. Right CT  + CARDIOVASCULAR: S1, S2 normal. No murmurs, rubs, or gallops.  ABDOMEN: Soft, nontender, nondistended. Bowel sounds present. No organomegaly or mass.  EXTREMITIES: No cyanosis, clubbing or edema b/l.    NEUROLOGIC: Cranial nerves II through XII are intact. No focal Motor or sensory deficits b/l.   PSYCHIATRIC:  patient is alert and oriented x 3.  SKIN: No obvious rash, lesion, or ulcer.   LABORATORY PANEL:  CBC  Recent Labs Lab 08/20/15 1539  WBC 16.2*  HGB 16.0  HCT 46.7  PLT 197    Chemistries   Recent Labs Lab 08/20/15 1539  NA 134*  K 4.3  CL 101  CO2 26  GLUCOSE 144*  BUN 11  CREATININE 0.62  CALCIUM 8.5*   Cardiac Enzymes No results for input(s): TROPONINI in the last 168 hours. RADIOLOGY:  Dg Chest 2 View  Result Date: 08/23/2015 CLINICAL DATA:  Postoperative evaluation, chest tube placement, portable chest x-ray of August 21, 2015 status post right upper lobectomy. EXAM: CHEST  2 VIEW COMPARISON:  Portable chest x-ray of August 21, 2015. FINDINGS: The left lung is clear but slightly less well inflated today. On the right there is postsurgical volume loss. The 2 chest tubes are in stable position. There is no pneumothorax. A small amount of pleural fluid is present. The heart and pulmonary vascularity are normal. The mediastinum is normal in width. There is subcutaneous emphysema in the right axillary region. There are surgical skin staples here as well. IMPRESSION: Stable appearance of the chest without pneumothorax nor large pleural effusion. The 2 right-sided chest tubes are in stable position. Electronically Signed   By: David  SwazilandJordan M.D.   On: 08/23/2015 07:41   ASSESSMENT AND PLAN:  45 year old  male status post right upper lobectomy seen for medical management.  1. Lung nodule: Status post thoracotomy; chest tube draining serosanguineous fluid well. -cont pain management with Epidural PCA per anesthesia - prn tramadol for moderate pain.  -Pathology AFB+,  caseating granuloma.  AFB culture and stain sent -sats >92% on 2 liter Followed by ID  2. COPD: Albuterol as needed. - inhaled corticosteroid when okay by CT surgery -. Pneumothorax present as expected following thoracotomy. CXR looks stable  3. Tobacco abuse: NicoDerm patch  4. CAD: Per CT scan of the chest last month the patient had significant calcifications of the LAD. Stress test prior to surgery negative for coronary ischemia.  -cont aspirin   5. DVT prophylaxis: SCDs   Case discussed with Care Management/Social Worker. Management plans discussed with the patient, family and they are in agreement.  CODE STATUS: full  DVT Prophylaxis:lovenox  TOTAL TIME TAKING CARE OF THIS PATIENT: 25minutes.  >50% time spent on counselling and coordination of care  POSSIBLE D/C IN 1-2 DAYS, DEPENDING ON CLINICAL CONDITION.  Note: This dictation was prepared with Dragon dictation along with smaller phrase technology. Any transcriptional errors that result from this process are unintentional.  Stefanny Pieri M.D on 08/23/2015 at 5:17 PM  Between 7am to 6pm - Pager - [phone]  After 6pm go to [email or web address] - password [email or web address] [facility]  [facility]  Office  [phone]  CC: Primary care physician; Marikay AlarEric Sonnenberg, MD

## 2015-08-23 NOTE — Progress Notes (Signed)
Patient encouraged to ambulate tonight per MD order, patient verbalized he was in too much pain at this time to ambulate.  Anselm Junglingonyers,Chau Sawin M

## 2015-08-23 NOTE — Progress Notes (Signed)
PULMONARY / CRITICAL CARE MEDICINE   Name: CYLUS DOUVILLE MRN: [id number] DOB: 06-07-1970    ADMISSION DATE:  08/20/2015 Referring MD - Dr. Thelma Barge Reason for consult - COPD management   BRIEF HISTORY: 45 yo male with a PMH of COPD, tobacco abuse, shortness of breath, asthma, GERD, and  restless leg syndrome.  He presented to [facility] on 8/14 for a preoperative bronchoscopy to assess endobronchial anatomy and right thoracotomy and right upper lobectomy due to CT of chest results on 07/13/2015 indicating an increase in size of right upper lobe lung nodule appearing partially cavitary concerning for primary bronchogenic carcinoma when compared to previous CT of chest findings on 04/03/2015.  PCCM consulted 8/14 for management of COPD.  SUBJECTIVE:  Doing well overnight, on RA pain improved.  States he has ambulated three times today and tolerated well with some mild shortness of breath.  Family at bedside. R CT drained about 150cc of mild serosangious fluid overnight.    SIGNIFICANT EVENTS: 8/14>>RUL lobectomy and thoracotomy s/p CT placement for PTX  VITAL SIGNS: Temp:  [97.8 F (36.6 C)-98.3 F (36.8 C)] 98.3 F (36.8 C) (08/17 0035) Pulse Rate:  [82-90] 85 (08/17 0035) Resp:  [14-20] 20 (08/17 0409) BP: (138-149)/(82-93) 138/83 (08/17 0035) SpO2:  [90 %-100 %] 92 % (08/17 0413) FiO2 (%):  [0 %-21 %] 0 % (08/17 0409) HEMODYNAMICS:   VENTILATOR SETTINGS: FiO2 (%):  [0 %-21 %] 0 % INTAKE / OUTPUT:  Intake/Output Summary (Last 24 hours) at 08/23/15 1131 Last data filed at 08/23/15 0705  Gross per 24 hour  Intake            160.1 ml  Output             1160 ml  Net           -999.9 ml    Review of Systems  Constitutional: Negative for chills and fever.  Eyes: Negative for blurred vision and double vision.  Respiratory: Positive for cough and shortness of breath.   Cardiovascular: Negative for chest pain.  Gastrointestinal: Negative for nausea and vomiting.  Genitourinary: Negative  for dysuria.  Musculoskeletal: Negative for myalgias.  Skin: Negative for rash.  Neurological: Negative for dizziness and headaches.  Endo/Heme/Allergies: Does not bruise/bleed easily.  Psychiatric/Behavioral: Negative for depression.    Physical Exam  Nursing note and vitals reviewed.    LABS:  CBC  Recent Labs Lab 08/20/15 1539  WBC 16.2*  HGB 16.0  HCT 46.7  PLT 197   Coag's No results for input(s): APTT, INR in the last 168 hours. BMET  Recent Labs Lab 08/20/15 1539  NA 134*  K 4.3  CL 101  CO2 26  BUN 11  CREATININE 0.62  GLUCOSE 144*   Electrolytes  Recent Labs Lab 08/20/15 1539  CALCIUM 8.5*   Sepsis Markers No results for input(s): LATICACIDVEN, PROCALCITON, O2SATVEN in the last 168 hours. ABG No results for input(s): PHART, PCO2ART, PO2ART in the last 168 hours. Liver Enzymes No results for input(s): AST, ALT, ALKPHOS, BILITOT, ALBUMIN in the last 168 hours. Cardiac Enzymes No results for input(s): TROPONINI, PROBNP in the last 168 hours. Glucose  Recent Labs Lab 08/20/15 1527  GLUCAP 167*    Imaging Dg Chest 2 View  Result Date: 08/23/2015 CLINICAL DATA:  Postoperative evaluation, chest tube placement, portable chest x-ray of August 21, 2015 status post right upper lobectomy. EXAM: CHEST  2 VIEW COMPARISON:  Portable chest x-ray of August 21, 2015. FINDINGS: The left  lung is clear but slightly less well inflated today. On the right there is postsurgical volume loss. The 2 chest tubes are in stable position. There is no pneumothorax. A small amount of pleural fluid is present. The heart and pulmonary vascularity are normal. The mediastinum is normal in width. There is subcutaneous emphysema in the right axillary region. There are surgical skin staples here as well. IMPRESSION: Stable appearance of the chest without pneumothorax nor large pleural effusion. The 2 right-sided chest tubes are in stable position. Electronically Signed   By: David   SwazilandJordan M.D.   On: 08/23/2015 07:41    STUDIES:  CT of Chest 07/13/15>>There has been increase in size of the right upper lobe lung nodule which now appears partially cavitary. Findings are highly concerning for primary bronchogenic carcinoma. Recommend thoracic surgery consultation and or biopsy. The other small nodules have remained unchanged. Diffuse bronchial wall thickening with emphysema, as above; imaging findings suggestive of underlying COPD. LAD coronary artery calcification.  CULTURES: None  ANTIBIOTICS: Cefuroxime 8/14>>8/15  SIGNIFICANT EVENTS: 8/14- [facility] for right thoracotomy and right lobectomy admitted to ICU and PCCM consulted for management of COPD  LINES/TUBES: Right pleural chest tube 8/14>> PIV's 8/14>> Epidural Catheter 8/14>>  ASSESSMENT / PLAN: 45 yo smoker, with RUL nodule, now s/p RUL lobectomy with R Chest tube placement  RUL nodule s/p RUL lobectomy S/P thoracotomy  R PTX R Pleural effusion COPD Cigarette User GERD Caseating granuloma of the Lung  P: -CXR reviewed, resolving PTX, CTS following - cont with pain management.  - PT and incentive spirometry regularly - no inhaled steroids at this time, prn Duonebs - maintain O2 sats >88% - restart home inhalers closer to discharge.  - Path from lung nodules with caesating granuloma. ID following, AFB and fungal workup already in progress, low risk for TB, no need for antifungal or antimicrobial treatment and this time.  Await culture and AFB results.   Thank you for consulting [facility], Please feel free to contacts us with any questions at [phone] (please enter 7-digits).  Sonda Rumbleana Blakeney, AGNP  Pulmonary/Critical Care Pager [phone] (please enter 7 digits) PCCM Consult Pager [phone] (please enter 7 [phone])   STAFF NOTE: I, Dr. Stephanie AcreVishal Chanz Cahall have personally reviewed patient's available data, including medical history, events of note,  physical examination and test results as part of my evaluation. I have discussed with resident/NP NP Karin GoldenBlakeney Tukov Varughese and other care providers such as pharmacist, RN and RRT.  In addition,  I personally evaluated patient and elicited key findings of   Still with some mild pain around CT insertion site but with overall improvement, needs to use incentive spirometry more often.    A:45 yo smoker, with RUL nodule, now s/p RUL lobectomy with R Chest tube placement  RUL nodule s/p RUL lobectomy S/P thoracotomy  R PTX R Pleural effusion COPD Cigarette User GERD Caseating granuloma of the Lung  - may resume home inhalers tomorrow (Flovent and Bevespi) along with PRN Duonebs - maintain O2 sats >88% - Path from lung nodules with caesating granuloma. ID following, AFB and fungal workup already in progress, low risk for TB,no need for antifungal or antimicrobial treatment and this time.  Await culture and AFB results, again ID following along.    Marland Kitchen.  Rest per NP/medical resident whose note is outlined above and that I agree with  The patient is critically ill with multiple organ systems failure and requires high complexity  decision making for assessment and support, frequent evaluation and titration of therapies, application of advanced monitoring technologies and extensive interpretation of multiple databases.   Pulmonary Care Time devoted to patient care services described in this note is  35 Minutes.   This time reflects time of care of this signee Dr Stephanie AcreVishal Maccoy Haubner.  This critical care time does not reflect procedure time, or teaching time or supervisory time of PA/NP/Med-student/Med Resident etc but could involve care discussion time.  Stephanie AcreVishal Cali Hope, MD [facility] Pager [phone] (please enter 7-digits) On Call Pager - [phone] (please enter 7-digits)  Note: This note was prepared with Dragon dictation along with smaller phrase technology. Any  transcriptional errors that result from this process are unintentional.

## 2015-08-24 ENCOUNTER — Encounter: Payer: Self-pay | Admitting: Infectious Diseases

## 2015-08-24 ENCOUNTER — Telehealth: Payer: Self-pay | Admitting: *Deleted

## 2015-08-24 ENCOUNTER — Inpatient Hospital Stay: Payer: 59

## 2015-08-24 DIAGNOSIS — A310 Pulmonary mycobacterial infection: Secondary | ICD-10-CM

## 2015-08-24 HISTORY — DX: Pulmonary mycobacterial infection: A31.0

## 2015-08-24 MED ORDER — FLUTICASONE FUROATE-VILANTEROL 200-25 MCG/INH IN AEPB
1.0000 | INHALATION_SPRAY | Freq: Every day | RESPIRATORY_TRACT | Status: DC
  Administered 2015-08-24 – 2015-08-26 (×3): 1 via RESPIRATORY_TRACT
  Filled 2015-08-24: qty 28

## 2015-08-24 MED ORDER — OXYCODONE-ACETAMINOPHEN 5-325 MG PO TABS
1.0000 | ORAL_TABLET | ORAL | Status: DC | PRN
  Administered 2015-08-24 – 2015-08-25 (×4): 1 via ORAL
  Filled 2015-08-24 (×5): qty 1

## 2015-08-24 MED ORDER — TIOTROPIUM BROMIDE MONOHYDRATE 18 MCG IN CAPS
18.0000 ug | ORAL_CAPSULE | Freq: Every day | RESPIRATORY_TRACT | Status: DC
  Administered 2015-08-24 – 2015-08-26 (×3): 18 ug via RESPIRATORY_TRACT
  Filled 2015-08-24: qty 5

## 2015-08-24 NOTE — Telephone Encounter (Signed)
-----   Message from Stephanie AcreVishal Mungal, MD sent at 08/24/2015 11:11 AM EDT ----- Regarding: HFU with Dr. Belia HemanKasa Please setup HFU 15min visit with Dr. Belia HemanKasa (his patient) in 2-3 weeks.  Thanks VM

## 2015-08-24 NOTE — Progress Notes (Signed)
[facility] [place] [facility]   PATIENT NAME: Jimmy EvesJeffrey Howell    MR#:  [id number]  DATE OF BIRTH:  Mar 25, 1970  SUBJECTIVE:  Ambulating in the hallways with wife  Looks and feels much better No bm yet REVIEW OF SYSTEMS:   Review of Systems  Constitutional: Negative for chills, fever and weight loss.  HENT: Negative for ear discharge, ear pain and nosebleeds.   Eyes: Negative for blurred vision, pain and discharge.  Respiratory: Negative for sputum production, shortness of breath, wheezing and stridor.   Cardiovascular: Negative for palpitations, orthopnea and PND.  Gastrointestinal: Negative for abdominal pain, diarrhea, nausea and vomiting.  Genitourinary: Negative for frequency and urgency.  Musculoskeletal: Positive for back pain. Negative for joint pain.  Neurological: Positive for weakness. Negative for sensory change, speech change and focal weakness.  Psychiatric/Behavioral: Negative for depression and hallucinations. The patient is not nervous/anxious.   All other systems reviewed and are negative.  Tolerating Diet: some Tolerating PT: not needed, ambulatory  DRUG ALLERGIES:   Allergies  Allergen Reactions  . Chantix [Varenicline] Nausea Only    VITALS:  Blood pressure (!) 146/90, pulse 82, temperature 98.2 F (36.8 C), resp. rate 18, height 5\' 9"  (1.753 m), weight 197 lb 12 oz (89.7 kg), SpO2 98 %.  PHYSICAL EXAMINATION:   Physical Exam  GENERAL:  45 y.o.-year-old patient lying in the bed with no acute distress.  EYES: Pupils equal, round, reactive to light and accommodation. No scleral icterus. Extraocular muscles intact.  HEENT: Head atraumatic, normocephalic. Oropharynx and nasopharynx clear.  NECK:  Supple, no jugular venous distention. No thyroid enlargement, no tenderness.  LUNGS: Normal breath sounds bilaterally, no wheezing, rales, rhonchi. No use of accessory muscles of respiration. Right CT + CARDIOVASCULAR: S1, S2 normal.  No murmurs, rubs, or gallops.  ABDOMEN: Soft, nontender, nondistended. Bowel sounds present. No organomegaly or mass.  EXTREMITIES: No cyanosis, clubbing or edema b/l.    NEUROLOGIC: Cranial nerves II through XII are intact. No focal Motor or sensory deficits b/l.   PSYCHIATRIC:  patient is alert and oriented x 3.  SKIN: No obvious rash, lesion, or ulcer.   LABORATORY PANEL:  CBC  Recent Labs Lab 08/20/15 1539  WBC 16.2*  HGB 16.0  HCT 46.7  PLT 197    Chemistries   Recent Labs Lab 08/20/15 1539  NA 134*  K 4.3  CL 101  CO2 26  GLUCOSE 144*  BUN 11  CREATININE 0.62  CALCIUM 8.5*   Cardiac Enzymes No results for input(s): TROPONINI in the last 168 hours. RADIOLOGY:  Dg Chest 2 View  Result Date: 08/23/2015 CLINICAL DATA:  Postoperative evaluation, chest tube placement, portable chest x-ray of August 21, 2015 status post right upper lobectomy. EXAM: CHEST  2 VIEW COMPARISON:  Portable chest x-ray of August 21, 2015. FINDINGS: The left lung is clear but slightly less well inflated today. On the right there is postsurgical volume loss. The 2 chest tubes are in stable position. There is no pneumothorax. A small amount of pleural fluid is present. The heart and pulmonary vascularity are normal. The mediastinum is normal in width. There is subcutaneous emphysema in the right axillary region. There are surgical skin staples here as well. IMPRESSION: Stable appearance of the chest without pneumothorax nor large pleural effusion. The 2 right-sided chest tubes are in stable position. Electronically Signed   By: David  SwazilandJordan M.D.   On: 08/23/2015 07:41   Dg [facility]  Result  Date: 08/24/2015 CLINICAL DATA:  Right chest tube present.  Recent right thoracotomy EXAM: PORTABLE CHEST 1 VIEW COMPARISON:  August 23, 2015 FINDINGS: Chest tube remains in place on the right without apparent pneumothorax. Soft tissue air is noted on the right laterally. There is mild volume loss on the  right, likely due to postoperative change. There is left base atelectasis as well. Lungs elsewhere clear. Heart size and pulmonary vascularity are normal. No adenopathy. IMPRESSION: No pneumothorax. Right chest tube position unchanged. Areas of presumed postoperative volume loss the right. There is also mild left base atelectasis, stable. The appearance of the lungs is stable compared to 1 day prior. Stable cardiac silhouette. Electronically Signed   By: Bretta BangWilliam  Woodruff III M.D.   On: 08/24/2015 07:43   ASSESSMENT AND PLAN:  45 year old male status post right upper lobectomy seen for medical management.  1. Lung nodule: Status post thoracotomy; chest tube +  -cont pain management with Epidural PCA per anesthesia - prn tramadol for moderate pain.  -Pathology AFB+, caseating granuloma.  AFB culture and stain sent per ID will take several weeks for final results. This can be followed up as outpt -sats >92% on RA Followed by ID  2. COPD: Albuterol as needed. -CXR looks stable  3. Tobacco abuse: NicoDerm patch  4. CAD: Per CT scan of the chest last month the patient had significant calcifications of the LAD. Stress test prior to surgery negative for coronary ischemia.  -cont aspirin   5. DVT prophylaxis: SCDs   Case discussed with Care Management/Social Worker. Management plans discussed with the patient, family and they are in agreement.  CODE STATUS: full  DVT Prophylaxis:lovenox  TOTAL TIME TAKING CARE OF THIS PATIENT: 25minutes.  >50% time spent on counselling and coordination of care  POSSIBLE D/C IN 1-2 DAYS, DEPENDING ON CLINICAL CONDITION.  Note: This dictation was prepared with Dragon dictation along with smaller phrase technology. Any transcriptional errors that result from this process are unintentional.  Shantee Hayne M.D on 08/24/2015 at 11:34 AM  Between 7am to 6pm - Pager - [phone]  After 6pm go to [email or web address] - password [email or web address] [facility]  [facility] [place]  [facility]  Office  [phone]  CC: Primary care physician; Marikay AlarEric Sonnenberg, MD

## 2015-08-24 NOTE — Telephone Encounter (Signed)
Spoke with [name] on the unit and gave hosp f/u appt for 09/11/15. Nothing further needed.

## 2015-08-24 NOTE — Anesthesia Post-op Follow-up Note (Signed)
  Anesthesia Pain Follow-up Note  Patient: Marcelle OverlieJeffrey L Leavy  Day #: 4  Date of Follow-up: 08/24/2015 Time: 7:42 AM  Last Vitals:  Vitals:   08/23/15 2130 08/24/15 0431  BP: (!) 138/94 (!) 146/90  Pulse: 81 82  Resp: 18 18  Temp: 36.7 C 36.8 C    Level of Consciousness: alert  Pain: 2 /10   Side Effects:None  Catheter Site Exam:clean, dry, no drainage  Epidural / Intrathecal    Start     Dose/Rate Route Frequency Ordered Stop   08/21/15 1345  fentaNYL 2.5 mcg/ml w/ropivacaine 0.2% (preservative free) in normal saline 100 mL EPIDURAL Infusion in 150 ml Intravia Bag     8 mL/hr 8 mL/hr  Epidural Continuous 08/21/15 1334         Plan: Continue current therapy  Mathews ArgyleLogan,  Yaniv Lage P

## 2015-08-24 NOTE — Progress Notes (Signed)
PULMONARY / CRITICAL CARE MEDICINE   Name: Jimmy Howell MRN: [id number] DOB: 04-10-1970    ADMISSION DATE:  08/20/2015 Referring MD - Dr. Thelma Bargeaks Reason for consult - COPD management   BRIEF HISTORY: 45 yo male with a PMH of COPD, tobacco abuse, shortness of breath, asthma, GERD, and  restless leg syndrome.  He [facility] on 8/14 for a preoperative bronchoscopy to assess endobronchial anatomy and right thoracotomy and right upper lobectomy due to CT of chest results on 07/13/2015 indicating an increase in size of right upper lobe lung nodule appearing partially cavitary concerning for primary bronchogenic carcinoma when compared to previous CT of chest findings on 04/03/2015.  PCCM consulted 8/14 for management of COPD.  SUBJECTIVE:  Doing well overnight, on RA pain improved.  Continues with ambulation and incentive spirometry   SIGNIFICANT EVENTS: 8/14>>RUL lobectomy and thoracotomy s/p CT placement for PTX  VITAL SIGNS: Temp:  [98.1 F (36.7 C)-98.2 F (36.8 C)] 98.2 F (36.8 C) (08/18 0431) Pulse Rate:  [81-86] 82 (08/18 0431) Resp:  [16-18] 18 (08/18 0431) BP: (135-146)/(79-94) 146/90 (08/18 0431) SpO2:  [94 %-98 %] 98 % (08/18 0431) HEMODYNAMICS:   VENTILATOR SETTINGS:   INTAKE / OUTPUT:  Intake/Output Summary (Last 24 hours) at 08/24/15 1059 Last data filed at 08/24/15 0455  Gross per 24 hour  Intake              360 ml  Output              800 ml  Net             -440 ml    Review of Systems  Constitutional: Negative for chills and fever.  Eyes: Negative for blurred vision and double vision.  Respiratory: Positive for cough and shortness of breath.   Cardiovascular: Negative for chest pain.  Gastrointestinal: Negative for nausea and vomiting.  Genitourinary: Negative for dysuria.  Musculoskeletal: Negative for myalgias.  Skin: Negative for rash.  Neurological: Negative for dizziness and headaches.  Endo/Heme/Allergies: Does not bruise/bleed easily.   Psychiatric/Behavioral: Negative for depression.    Physical Exam  Constitutional: He is oriented to person, place, and time and well-developed, well-nourished, and in no distress.  HENT:  Head: Normocephalic.  Eyes: EOM are normal. Pupils are equal, round, and reactive to light.  Neck: Normal range of motion.  Cardiovascular: Normal rate and normal heart sounds.   Pulmonary/Chest: No respiratory distress. He has no wheezes. He exhibits tenderness.  Dec bs at the right base Tenderness around the chest tube insertion site.   Abdominal: Soft. Bowel sounds are normal. He exhibits no distension.  Musculoskeletal: He exhibits no edema or tenderness.  Neurological: He is alert and oriented to person, place, and time.  Skin: Skin is warm and dry. No erythema.  Psychiatric: Affect normal.  Nursing note and vitals reviewed.    LABS:  CBC  Recent Labs Lab 08/20/15 1539  WBC 16.2*  HGB 16.0  HCT 46.7  PLT 197   Coag's No results for input(s): APTT, INR in the last 168 hours. BMET  Recent Labs Lab 08/20/15 1539  NA 134*  K 4.3  CL 101  CO2 26  BUN 11  CREATININE 0.62  GLUCOSE 144*   Electrolytes  Recent Labs Lab 08/20/15 1539  CALCIUM 8.5*   Sepsis Markers No results for input(s): LATICACIDVEN, PROCALCITON, O2SATVEN in the last 168 hours. ABG No results for input(s): PHART, PCO2ART, PO2ART in the last 168 hours. Liver Enzymes No  results for input(s): AST, ALT, ALKPHOS, BILITOT, ALBUMIN in the last 168 hours. Cardiac Enzymes No results for input(s): TROPONINI, PROBNP in the last 168 hours. Glucose  Recent Labs Lab 08/20/15 1527  GLUCAP 167*    Imaging Dg Chest Port 1 View  Result Date: 08/24/2015 CLINICAL DATA:  Right chest tube present.  Recent right thoracotomy EXAM: PORTABLE CHEST 1 VIEW COMPARISON:  August 23, 2015 FINDINGS: Chest tube remains in place on the right without apparent pneumothorax. Soft tissue air is noted on the right laterally. There  is mild volume loss on the right, likely due to postoperative change. There is left base atelectasis as well. Lungs elsewhere clear. Heart size and pulmonary vascularity are normal. No adenopathy. IMPRESSION: No pneumothorax. Right chest tube position unchanged. Areas of presumed postoperative volume loss the right. There is also mild left base atelectasis, stable. The appearance of the lungs is stable compared to 1 day prior. Stable cardiac silhouette. Electronically Signed   By: Bretta BangWilliam  Woodruff III M.D.   On: 08/24/2015 07:43    STUDIES:  CT of Chest 07/13/15>>There has been increase in size of the right upper lobe lung nodule which now appears partially cavitary. Findings are highly concerning for primary bronchogenic carcinoma. Recommend thoracic surgery consultation and or biopsy. The other small nodules have remained unchanged. Diffuse bronchial wall thickening with emphysema, as above; imaging findings suggestive of underlying COPD. LAD coronary artery calcification.  CULTURES: RUL nodule resection path 8/14>caesating granuloma  ANTIBIOTICS: Cefuroxime 8/14>>8/15  SIGNIFICANT EVENTS: 8/14- [facility]ac[facility] and right lobectomy admitted to ICU and PCCM consulted for management of COPD  LINES/TUBES: Right pleural chest tube 8/14>> PIV's 8/14>> Epidural Catheter 8/14>>  ASSESSMENT / PLAN: A:45 yo smoker, with RUL nodule, now s/p RUL lobectomy with R Chest tube placement  RUL nodule s/p RUL lobectomy S/P thoracotomy  R PTX - resolving R Pleural effusion COPD Cigarette User GERD Caseating granuloma of the Lung  P: - pain management per surgery, transitioning to POnarcotics - Chest tube output decreasing.  - Flovent and Bevespi not on formulary - will start Breo and Spiriva while inpatient, then resume home meds at discharge.  - maintain O2 sats >88% - Path from lung nodules with caesating granuloma. ID following, AFB and fungal workup already in  progress, low risk for TB,no need for antifungal or antimicrobial treatment and this time.  Await culture and AFB results, again ID following along. - final cultures from RUL lesion will take weeks to be resulted, currently the AFB smears are negative, in any event no treatment is required this time. ID is following along and will determine treatment options once the final results come back in a couple week from the outside lab facility [facility] per Dr. Sampson GoonFitzgerald    Pulmonary consult time -35 mins  Stephanie AcreVishal Zorion Nims, MD  Pulmonary and Critical Care Pager [phone] (please enter 7-digits) On Call Pager - [phone] (please enter 7-digits)  Note: This note was prepared with Dragon dictation along with smaller phrase technology. Any transcriptional errors that result from this process are unintentional.

## 2015-08-24 NOTE — Progress Notes (Signed)
[facility] Post-Op Note  Patient ID: Marcelle OverlieJeffrey L Buboltz, male   DOB: 1970/03/03, 45 y.o.   MRN: [id number]  HISTORY: He did well overnight. He does not complain of any significant pain. He has passed gas but has not had a bowel movement yet. He is not short of breath. He's had no fevers.   Vitals:   08/23/15 2130 08/24/15 0431  BP: (!) 138/94 (!) 146/90  Pulse: 81 82  Resp: 18 18  Temp: 98.1 F (36.7 C) 98.2 F (36.8 C)     EXAM:    Resp: Lungs are clear bilaterally But diminished on the right side..  No respiratory distress, normal effort. Heart:  Regular without murmurs Abd:  Abdomen is soft, non distended and non tender. No masses are palpable.  There is no rebound and no guarding.  Neurological: Alert and oriented to person, place, and time. Coordination normal.  Skin: Skin is warm and dry. No rash noted. No diaphoretic. No erythema. No pallor.  Psychiatric: Normal mood and affect. Normal behavior. Judgment and thought content normal.   His chest x-ray today shows no pleural effusion or pneumothorax. There is no air leak. The patient is currently on waterseal.   ASSESSMENT: I discussed his care with Dr. Pernell DupreAdams in  anesthesiology. We can reduce his epidural and introduce oral narcotics as we try to wean him onto oral medications that can be taken at home.  We will continue to monitor his chest tubes. We are awaiting the final culture results from the upper lobe.   PLAN:   I have decreased his epidural from 8 cc/h to 4 cc/h. I have introduced oral narcotics. We will obtain a chest x-ray tomorrow. It is likely could have 1 tube removed. We will continue to transition him to oral narcotic management so that he may be discharged in the near future.    Hulda Marinimothy Kamira Mellette, MD

## 2015-08-25 ENCOUNTER — Inpatient Hospital Stay: Payer: 59

## 2015-08-25 MED ORDER — OXYCODONE-ACETAMINOPHEN 5-325 MG PO TABS
2.0000 | ORAL_TABLET | Freq: Four times a day (QID) | ORAL | Status: DC | PRN
  Administered 2015-08-25 – 2015-08-27 (×7): 2 via ORAL
  Filled 2015-08-25 (×7): qty 2

## 2015-08-25 NOTE — Progress Notes (Signed)
Subjective:   He is feeling relatively well today. He still has some pain control issues but they're improving. He does not have any shortness of breath. There is no air leak noted on his Pleur-evac on water seal and his chest x-ray demonstrates no pneumothorax.  Vital signs in last 24 hours: Temp:  [97.6 F (36.4 C)-97.9 F (36.6 C)] 97.6 F (36.4 C) (08/19 0500) Resp:  [16-18] 16 (08/19 0500) BP: (137-172)/(87-100) 172/100 (08/19 0500) SpO2:  [94 %-96 %] 96 % (08/19 0500) FiO2 (%):  [0 %] 0 % (08/19 0012) Last BM Date: 08/24/15  Intake/Output from previous day: 08/18 0701 - 08/19 0700 In: 176 [P.O.:60] Out: 150 [Chest Tube:150]  Exam:  He's had good pulmonary excursion with no wound problems. He does have multiple skin blisters. He has good breath sounds on that side.  Lab Results:  CBC No results for input(s): WBC, HGB, HCT, PLT in the last 72 hours. CMP     Component Value Date/Time   NA 134 (L) 08/20/2015 1539   K 4.3 08/20/2015 1539   CL 101 08/20/2015 1539   CO2 26 08/20/2015 1539   GLUCOSE 144 (H) 08/20/2015 1539   BUN 11 08/20/2015 1539   CREATININE 0.62 08/20/2015 1539   CALCIUM 8.5 (L) 08/20/2015 1539   PROT 7.4 07/27/2015 1252   ALBUMIN 4.6 07/27/2015 1252   AST 28 07/27/2015 1252   ALT 51 07/27/2015 1252   ALKPHOS 56 07/27/2015 1252   BILITOT 1.4 (H) 07/27/2015 1252   GFRNONAA >60 08/20/2015 1539   GFRAA >60 08/20/2015 1539   PT/INR No results for input(s): LABPROT, INR in the last 72 hours.  Studies/Results: Dg Chest Port 1 View  Result Date: 08/25/2015 CLINICAL DATA:  Status post right-sided thoracotomy EXAM: PORTABLE CHEST 1 VIEW COMPARISON:  August 24, 2015 FINDINGS: Chest tube remains in the right without appreciable pneumothorax. There is scarring with atelectatic change on the right, stable. Soft tissue air is noted on the right laterally and inferiorly. Skin staples are noted in this area. Left lung is clear. Heart size and pulmonary vascularity  are normal. No adenopathy. No bone lesions. IMPRESSION: Postoperative change on the right with stable scarring and volume loss. No pneumothorax. Stable soft tissue air on the right laterally. Left lung now clear. Stable cardiac silhouette. Electronically Signed   By: Bretta BangWilliam  Woodruff III M.D.   On: 08/25/2015 10:04   Dg Chest Port 1 View  Result Date: 08/24/2015 CLINICAL DATA:  Right chest tube present.  Recent right thoracotomy EXAM: PORTABLE CHEST 1 VIEW COMPARISON:  August 23, 2015 FINDINGS: Chest tube remains in place on the right without apparent pneumothorax. Soft tissue air is noted on the right laterally. There is mild volume loss on the right, likely due to postoperative change. There is left base atelectasis as well. Lungs elsewhere clear. Heart size and pulmonary vascularity are normal. No adenopathy. IMPRESSION: No pneumothorax. Right chest tube position unchanged. Areas of presumed postoperative volume loss the right. There is also mild left base atelectasis, stable. The appearance of the lungs is stable compared to 1 day prior. Stable cardiac silhouette. Electronically Signed   By: Bretta BangWilliam  Woodruff III M.D.   On: 08/24/2015 07:43    Assessment/Plan: I reviewed his chest x-ray. We will remove his posterior chest tube. He continues to have no air leak. We will review his chest x-ray tomorrow morning and make a decision about removing a second tube. He is in agreement.

## 2015-08-25 NOTE — Progress Notes (Signed)
PULMONARY / CRITICAL CARE MEDICINE   Name: Jimmy Howell MRN: [id number] DOB: 12-Jun-1970    ADMISSION DATE:  08/20/2015  ADMISSION DATE:  08/20/2015 Referring MD - Dr. Thelma Bargeaks Reason for consult - COPD management   BRIEF HISTORY: 45 yo male with a PMH of COPD, tobacco abuse, shortness of breath, asthma, GERD, and restless leg syndrome. [facility] for a preoperative bronchoscopy to assess endobronchial anatomy and right thoracotomy and right upper lobectomy due to CT of chest results on 07/13/2015 indicating an increase in size ofright upper lobe lung nodule appearing partially cavitary concerning for primary bronchogenic carcinoma when compared to previous CT of chest findings on 04/03/2015. PCCM consulted 8/14 for management of COPD.  SUBJECTIVE:  Doing well overnight, on RA pain improved.  Continues with ambulation and incentive spirometry, CT output decreasing, will have cxr and possible removal of 1 chest tube.    SIGNIFICANT EVENTS: 8/14>>RUL lobectomy and thoracotomy s/p CT placement for PTX  VITAL SIGNS: Temp:  [97.6 F (36.4 C)-98 F (36.7 C)] 97.6 F (36.4 C) (08/19 0500) Pulse Rate:  [91] 91 (08/18 1100) Resp:  [16-18] 16 (08/19 0500) BP: (137-172)/(86-100) 172/100 (08/19 0500) SpO2:  [94 %-96 %] 96 % (08/19 0500) FiO2 (%):  [0 %] 0 % (08/19 0012) HEMODYNAMICS:   VENTILATOR SETTINGS: FiO2 (%):  [0 %] 0 % INTAKE / OUTPUT:  Intake/Output Summary (Last 24 hours) at 08/25/15 1007 Last data filed at 08/25/15 0510  Gross per 24 hour  Intake              176 ml  Output              150 ml  Net               26 ml    Review of Systems  Constitutional: Negative for chills and fever.  Eyes: Negative for blurred vision and double vision.  Respiratory: Positive for shortness of breath. Negative for cough, hemoptysis, sputum production and wheezing.   Cardiovascular: Negative for chest pain and palpitations.  Gastrointestinal: Negative for heartburn  and nausea.  Musculoskeletal: Negative for myalgias.  Neurological: Negative for dizziness and headaches.  Endo/Heme/Allergies: Does not bruise/bleed easily.  Psychiatric/Behavioral: Negative for depression.    Physical Exam  Constitutional: He is oriented to person, place, and time and well-developed, well-nourished, and in no distress.  HENT:  Head: Atraumatic.  Eyes: Conjunctivae and EOM are normal. Pupils are equal, round, and reactive to light.  Neck: Normal range of motion. Neck supple.  Cardiovascular: Normal rate, regular rhythm, normal heart sounds and intact distal pulses.   Pulmonary/Chest: Effort normal. He has no wheezes. He has no rales. He exhibits no tenderness.  2 chest tubes on right Dec bs on the right, but with improvement.   Abdominal: He exhibits no distension. There is no tenderness. There is no rebound and no guarding.  Musculoskeletal: Normal range of motion. He exhibits no edema.  Neurological: He is oriented to person, place, and time.  Skin: Skin is warm and dry.  Nursing note and vitals reviewed.    LABS:  CBC  Recent Labs Lab 08/20/15 1539  WBC 16.2*  HGB 16.0  HCT 46.7  PLT 197   Coag's No results for input(s): APTT, INR in the last 168 hours. BMET  Recent Labs Lab 08/20/15 1539  NA 134*  K 4.3  CL 101  CO2 26  BUN 11  CREATININE 0.62  GLUCOSE 144*  Electrolytes  Recent Labs Lab 08/20/15 1539  CALCIUM 8.5*   Sepsis Markers No results for input(s): LATICACIDVEN, PROCALCITON, O2SATVEN in the last 168 hours. ABG No results for input(s): PHART, PCO2ART, PO2ART in the last 168 hours. Liver Enzymes No results for input(s): AST, ALT, ALKPHOS, BILITOT, ALBUMIN in the last 168 hours. Cardiac Enzymes No results for input(s): TROPONINI, PROBNP in the last 168 hours. Glucose  Recent Labs Lab 08/20/15 1527  GLUCAP 167*    Imaging Result Date: 08/24/2015 CLINICAL DATA:  Right chest tube present.  Recent right thoracotomy  EXAM: PORTABLE CHEST 1 VIEW COMPARISON:  August 23, 2015 FINDINGS: Chest tube remains in place on the right without apparent pneumothorax. Soft tissue air is noted on the right laterally. There is mild volume loss on the right, likely due to postoperative change. There is left base atelectasis as well. Lungs elsewhere clear. Heart size and pulmonary vascularity are normal. No adenopathy. IMPRESSION: No pneumothorax. Right chest tube position unchanged. Areas of presumed postoperative volume loss the right. There is also mild left base atelectasis, stable. The appearance of the lungs is stable compared to 1 day prior. Stable cardiac silhouette. Electronically Signed   By: Bretta BangWilliam  Woodruff III M.D.   On: 08/24/2015 07:43   STUDIES: CT of Chest 07/13/15>>There has been increase in size of the right upper lobe lung nodule which now appears partially cavitary. Findings are highly concerning for primary bronchogenic carcinoma. Recommend thoracic surgery consultation and or biopsy. The other small nodules have remained unchanged. Diffuse bronchial wall thickening with emphysema, as above; imaging findings suggestive of underlying COPD. LAD coronary artery calcification.  CULTURES: RUL nodule resection path 8/14>caesating granuloma  ANTIBIOTICS: Cefuroxime 8/14>>8/15  ASSESSMENT / PLAN:  A:45 yo smoker, with RUL nodule, now s/p RUL lobectomy with R Chest tube placement  RUL nodule s/p RUL lobectomy S/P thoracotomy  R PTX - resolving R Pleural effusion COPD Cigarette User GERD Caseating granuloma of the Lung  P: - pain management per surgery, transitioning to POnarcotics - Chest tube output decreasing. Scheduled for CXR today and possible removal of one chest tube.  - Flovent and Bevespi not on formulary - cont with Breo and Spiriva while inpatient, then resume home meds at discharge.  - maintain O2 sats >88% - Path from lung nodules with caesating granuloma. ID following, AFB and fungal  workup already in progress, low risk for TB,no need for antifungal or antimicrobial treatment and this time. Await culture and AFB results, again ID following along. - final cultures from RUL lesion will take weeks to be resulted, currently the AFB smears are negative, in any event no treatment is required this time. ID is following along and will determine treatment options once the final results come back in a couple week from the outside lab facility [facility] per Dr. Sampson GoonFitzgerald - pulmonary clinic will call patient with follow up appointment with Dr. Belia HemanKasa.   Pulmonary consult time -35 mins  Thank you for consulting [facility], Please feel free to contacts us with any questions at [phone] (please enter 7-digits).  Stephanie AcreVishal Nija Koopman, MD [facility] Pager [phone] (please enter 7-digits) On Call Pager - [phone] (please enter 7-digits)  Note: This note was prepared with Dragon dictation along with smaller phrase technology. Any transcriptional errors that result from this process are unintentional.

## 2015-08-25 NOTE — Progress Notes (Signed)
[facility] - [place] [facility]   PATIENT NAME: Jimmy EvesJeffrey Howell    MR#:  [id number]  DATE OF BIRTH:  [birth year]  SUBJECTIVE:    Looks and feels much better Had BM y'day REVIEW OF SYSTEMS:   Review of Systems  Constitutional: Negative for chills, fever and weight loss.  HENT: Negative for ear discharge, ear pain and nosebleeds.   Eyes: Negative for blurred vision, pain and discharge.  Respiratory: Negative for sputum production, shortness of breath, wheezing and stridor.   Cardiovascular: Negative for palpitations, orthopnea and PND.  Gastrointestinal: Negative for abdominal pain, diarrhea, nausea and vomiting.  Genitourinary: Negative for frequency and urgency.  Musculoskeletal: Positive for back pain. Negative for joint pain.  Neurological: Positive for weakness. Negative for sensory change, speech change and focal weakness.  Psychiatric/Behavioral: Negative for depression and hallucinations. The patient is not nervous/anxious.   All other systems reviewed and are negative.  Tolerating Diet: some Tolerating PT: not needed, ambulatory  DRUG ALLERGIES:   Allergies  Allergen Reactions  . Chantix [Varenicline] Nausea Only    VITALS:  Blood pressure (!) 172/100, pulse 91, temperature 97.6 F (36.4 C), temperature source Oral, resp. rate 16, height 5\' 9"  (1.753 m), weight 197 lb 12 oz (89.7 kg), SpO2 96 %.  PHYSICAL EXAMINATION:   Physical Exam  GENERAL:  45 y.o.-year-old patient lying in the bed with no acute distress.  EYES: Pupils equal, round, reactive to light and accommodation. No scleral icterus. Extraocular muscles intact.  HEENT: Head atraumatic, normocephalic. Oropharynx and nasopharynx clear.  NECK:  Supple, no jugular venous distention. No thyroid enlargement, no tenderness.  LUNGS: Normal breath sounds bilaterally, no wheezing, rales, rhonchi. No use of accessory muscles of respiration. Right CT + CARDIOVASCULAR: S1, S2 normal. No  murmurs, rubs, or gallops.  ABDOMEN: Soft, nontender, nondistended. Bowel sounds present. No organomegaly or mass.  EXTREMITIES: No cyanosis, clubbing or edema b/l.    NEUROLOGIC: Cranial nerves II through XII are intact. No focal Motor or sensory deficits b/l.   PSYCHIATRIC:  patient is alert and oriented x 3.  SKIN: No obvious rash, lesion, or ulcer.   LABORATORY PANEL:  CBC  Recent Labs Lab 08/20/15 1539  WBC 16.2*  HGB 16.0  HCT 46.7  PLT 197    Chemistries   Recent Labs Lab 08/20/15 1539  NA 134*  K 4.3  CL 101  CO2 26  GLUCOSE 144*  BUN 11  CREATININE 0.62  CALCIUM 8.5*   Cardiac Enzymes No results for input(s): TROPONINI in the last 168 hours. RADIOLOGY:  Dg Chest Port 1 View  Result Date: 08/24/2015 CLINICAL DATA:  Right chest tube present.  Recent right thoracotomy EXAM: PORTABLE CHEST 1 VIEW COMPARISON:  August 23, 2015 FINDINGS: Chest tube remains in place on the right without apparent pneumothorax. Soft tissue air is noted on the right laterally. There is mild volume loss on the right, likely due to postoperative change. There is left base atelectasis as well. Lungs elsewhere clear. Heart size and pulmonary vascularity are normal. No adenopathy. IMPRESSION: No pneumothorax. Right chest tube position unchanged. Areas of presumed postoperative volume loss the right. There is also mild left base atelectasis, stable. The appearance of the lungs is stable compared to 1 day prior. Stable cardiac silhouette. Electronically Signed   By: Bretta BangWilliam  Woodruff III M.D.   On: 08/24/2015 07:43   ASSESSMENT AND PLAN:  45 year old male status post right upper lobectomy seen for medical management.  1. Lung nodule:  Status post thoracotomy; chest tube +  -cont pain management with Epidural PCA per anesthesia - prn tramadol for moderate pain.  -Pathology AFB+, caseating granuloma.  AFB culture and stain sent per ID will take several weeks for final results. This can be followed  up as outpt -sats >92% on RA Followed by ID -Pt will likely get one CT taken out today  2. COPD: Albuterol as needed. -CXR looks stable  3. Tobacco abuse: NicoDerm patch  4. CAD: Per CT scan of the chest last month the patient had significant calcifications of the LAD. Stress test prior to surgery negative for coronary ischemia.  -cont aspirin   5. DVT prophylaxis: SCDs   Case discussed with Care Management/Social Worker. Management plans discussed with the patient, family and they are in agreement.  CODE STATUS: full  DVT Prophylaxis:lovenox  TOTAL TIME TAKING CARE OF THIS PATIENT: 25minutes.  >50% time spent on counselling and coordination of care  POSSIBLE D/C IN 1-2 DAYS, DEPENDING ON CLINICAL CONDITION.  Note: This dictation was prepared with Dragon dictation along with smaller phrase technology. Any transcriptional errors that result from this process are unintentional.  Chizaram Latino M.D on 08/25/2015 at 9:57 AM  Between 7am to 6pm - Pager - [phone]  After 6pm go to [email or web address] - password [email or web address]  [facility]  Office  [phone]  CC: Primary care physician; Marikay AlarEric Sonnenberg, MD

## 2015-08-26 ENCOUNTER — Inpatient Hospital Stay: Payer: 59

## 2015-08-26 NOTE — Progress Notes (Signed)
Subjective:   He feels better today with no significant complaints. He has no shortness of breath. His pain is under good control. His x-ray reveals no pneumothorax.  Vital signs in last 24 hours: Temp:  [97.7 F (36.5 C)-97.8 F (36.6 C)] 97.7 F (36.5 C) (08/20 0435) Pulse Rate:  [76-98] 91 (08/20 0435) Resp:  [18-20] 20 (08/20 0435) BP: (131-160)/(76-117) 146/94 (08/20 0435) SpO2:  [96 %-98 %] 96 % (08/20 0435) Last BM Date: 08/25/15  Intake/Output from previous day: 08/19 0701 - 08/20 0700 In: 94.8  Out: 240 [Chest Tube:240]  Exam:  His chest is clear with some decreased breath sounds on the right side but overall no sign of any significant problem. His abdomen is benign. He does have multiple fracture blisters along his right side.  Lab Results:  CBC No results for input(s): WBC, HGB, HCT, PLT in the last 72 hours. CMP     Component Value Date/Time   NA 134 (L) 08/20/2015 1539   K 4.3 08/20/2015 1539   CL 101 08/20/2015 1539   CO2 26 08/20/2015 1539   GLUCOSE 144 (H) 08/20/2015 1539   BUN 11 08/20/2015 1539   CREATININE 0.62 08/20/2015 1539   CALCIUM 8.5 (L) 08/20/2015 1539   PROT 7.4 07/27/2015 1252   ALBUMIN 4.6 07/27/2015 1252   AST 28 07/27/2015 1252   ALT 51 07/27/2015 1252   ALKPHOS 56 07/27/2015 1252   BILITOT 1.4 (H) 07/27/2015 1252   GFRNONAA >60 08/20/2015 1539   GFRAA >60 08/20/2015 1539   PT/INR No results for input(s): LABPROT, INR in the last 72 hours.  Studies/Results: Dg Chest Port 1 View  Result Date: 08/26/2015 CLINICAL DATA:  Postoperative thoracotomy with right-sided chest tube. EXAM: PORTABLE CHEST 1 VIEW COMPARISON:  August 25, 2015 FINDINGS: Chest tube position on the right is stable. No evident pneumothorax. Areas of scarring and volume loss the right are stable. Soft tissue air persists on the right laterally and inferiorly. The left lung is clear except for slight left base atelectasis. Heart size and pulmonary vascularity are normal.  No adenopathy. IMPRESSION: New slight left base atelectasis. Postoperative change on the right with areas of mild scarring and atelectasis. No pneumothorax. No airspace consolidation. Stable soft tissue air laterally on the right inferiorly. Stable cardiac silhouette. Electronically Signed   By: Bretta BangWilliam  Woodruff III M.D.   On: 08/26/2015 07:54   Dg Chest Port 1 View  Result Date: 08/25/2015 CLINICAL DATA:  Status post right-sided thoracotomy EXAM: PORTABLE CHEST 1 VIEW COMPARISON:  August 24, 2015 FINDINGS: Chest tube remains in the right without appreciable pneumothorax. There is scarring with atelectatic change on the right, stable. Soft tissue air is noted on the right laterally and inferiorly. Skin staples are noted in this area. Left lung is clear. Heart size and pulmonary vascularity are normal. No adenopathy. No bone lesions. IMPRESSION: Postoperative change on the right with stable scarring and volume loss. No pneumothorax. Stable soft tissue air on the right laterally. Left lung now clear. Stable cardiac silhouette. Electronically Signed   By: Bretta BangWilliam  Woodruff III M.D.   On: 08/25/2015 10:04    Assessment/Plan: I removed his anterior chest tube and his epidural. We will increase his activity level. Anticipate possible discharge tomorrow.

## 2015-08-26 NOTE — Progress Notes (Signed)
[facility] [place] [facility]   PATIENT NAME: Cindra EvesJeffrey Strohmeyer    MR#:  [id number]  DATE OF BIRTH:  Apr 16, 1970  SUBJECTIVE:    Looks and feels much better Had BM y'day. Got posterior chest tube out yday and anterior CT out today REVIEW OF SYSTEMS:   Review of Systems  Constitutional: Negative for chills, fever and weight loss.  HENT: Negative for ear discharge, ear pain and nosebleeds.   Eyes: Negative for blurred vision, pain and discharge.  Respiratory: Negative for sputum production, shortness of breath, wheezing and stridor.   Cardiovascular: Negative for palpitations, orthopnea and PND.  Gastrointestinal: Negative for abdominal pain, diarrhea, nausea and vomiting.  Genitourinary: Negative for frequency and urgency.  Musculoskeletal: Positive for back pain. Negative for joint pain.  Neurological: Positive for weakness. Negative for sensory change, speech change and focal weakness.  Psychiatric/Behavioral: Negative for depression and hallucinations. The patient is not nervous/anxious.   All other systems reviewed and are negative.  Tolerating Diet: yes Tolerating PT: not needed, ambulatory  DRUG ALLERGIES:   Allergies  Allergen Reactions  . Chantix [Varenicline] Nausea Only    VITALS:  Blood pressure (!) 146/94, pulse 91, temperature 97.7 F (36.5 C), resp. rate 20, height 5\' 9"  (1.753 m), weight 197 lb 12 oz (89.7 kg), SpO2 96 %.  PHYSICAL EXAMINATION:   Physical Exam  GENERAL:  45 y.o.-year-old patient lying in the bed with no acute distress.  EYES: Pupils equal, round, reactive to light and accommodation. No scleral icterus. Extraocular muscles intact.  HEENT: Head atraumatic, normocephalic. Oropharynx and nasopharynx clear.  NECK:  Supple, no jugular venous distention. No thyroid enlargement, no tenderness.  LUNGS: Normal breath sounds bilaterally, no wheezing, rales, rhonchi. No use of accessory muscles of respiration. CARDIOVASCULAR:  S1, S2 normal. No murmurs, rubs, or gallops.  ABDOMEN: Soft, nontender, nondistended. Bowel sounds present. No organomegaly or mass.  EXTREMITIES: No cyanosis, clubbing or edema b/l.    NEUROLOGIC: Cranial nerves II through XII are intact. No focal Motor or sensory deficits b/l.   PSYCHIATRIC:  patient is alert and oriented x 3.  SKIN: No obvious rash, lesion, or ulcer.   LABORATORY PANEL:  CBC  Recent Labs Lab 08/20/15 1539  WBC 16.2*  HGB 16.0  HCT 46.7  PLT 197    Chemistries   Recent Labs Lab 08/20/15 1539  NA 134*  K 4.3  CL 101  CO2 26  GLUCOSE 144*  BUN 11  CREATININE 0.62  CALCIUM 8.5*   Cardiac Enzymes No results for input(s): TROPONINI in the last 168 hours. RADIOLOGY:  Dg Chest Port 1 View  Result Date: 08/26/2015 CLINICAL DATA:  Postoperative thoracotomy with right-sided chest tube. EXAM: PORTABLE CHEST 1 VIEW COMPARISON:  August 25, 2015 FINDINGS: Chest tube position on the right is stable. No evident pneumothorax. Areas of scarring and volume loss the right are stable. Soft tissue air persists on the right laterally and inferiorly. The left lung is clear except for slight left base atelectasis. Heart size and pulmonary vascularity are normal. No adenopathy. IMPRESSION: New slight left base atelectasis. Postoperative change on the right with areas of mild scarring and atelectasis. No pneumothorax. No airspace consolidation. Stable soft tissue air laterally on the right inferiorly. Stable cardiac silhouette. Electronically Signed   By: Bretta BangWilliam  Woodruff III M.D.   On: 08/26/2015 07:54   Dg Chest Port 1 View  Result Date: 08/25/2015 CLINICAL DATA:  Status post right-sided thoracotomy EXAM: PORTABLE CHEST 1 VIEW  COMPARISON:  August 24, 2015 FINDINGS: Chest tube remains in the right without appreciable pneumothorax. There is scarring with atelectatic change on the right, stable. Soft tissue air is noted on the right laterally and inferiorly. Skin staples are noted  in this area. Left lung is clear. Heart size and pulmonary vascularity are normal. No adenopathy. No bone lesions. IMPRESSION: Postoperative change on the right with stable scarring and volume loss. No pneumothorax. Stable soft tissue air on the right laterally. Left lung now clear. Stable cardiac silhouette. Electronically Signed   By: Bretta BangWilliam  Woodruff III M.D.   On: 08/25/2015 10:04   ASSESSMENT AND PLAN:  45 year old male status post right upper lobectomy seen for medical management.  1. Lung nodule: Status post thoracotomy; chest tube +  -cont pain management with Epidural PCA per anesthesia - prn tramadol for moderate pain.  -Pathology AFB+, caseating granuloma.  AFB culture and stain sent per ID will take several weeks for final results. This can be followed up as outpt with dr Sampson GoonFitzgerald -sats >92% on RA -s/p removal of Chest tubes. D/ced epidural by dr Michela PitcherEly  2. COPD: Albuterol as needed. -CXR looks stable  3. Tobacco abuse: NicoDerm patch  4. CAD: Per CT scan of the chest last month the patient had significant calcifications of the LAD. Stress test prior to surgery negative for coronary ischemia.  -cont aspirin   5. DVT prophylaxis: SCDs  Overall stable IM will sign off Case discussed with Care Management/Social Worker. Management plans discussed with the patient, family and they are in agreement.  CODE STATUS: full  DVT Prophylaxis:lovenox  TOTAL TIME TAKING CARE OF THIS PATIENT: 25minutes.  >50% time spent on counselling and coordination of care  POSSIBLE D/C IN 1-2 DAYS, DEPENDING ON CLINICAL CONDITION.  Note: This dictation was prepared with Dragon dictation along with smaller phrase technology. Any transcriptional errors that result from this process are unintentional.  Winfrey Chillemi M.D on 08/26/2015 at 2:37 PM  Between 7am to 6pm - Pager - [phone]  After 6pm go to [email or web address] - password [email or web address]  [facility]  Office   [phone]  CC: Primary care physician; Marikay AlarEric Sonnenberg, MD

## 2015-08-26 NOTE — Progress Notes (Signed)
Patient is alert and oriented, ambulating well without assistance, Pulse ox. 100% on room air. Dr. Michela PitcherEly discontinued the chest tube and epidural both site dressing clean dry and intact. Patient tolerating regula diet. Wife at the bedside, Isolation remains in place until sputum results received.

## 2015-08-27 ENCOUNTER — Inpatient Hospital Stay: Payer: 59

## 2015-08-27 MED ORDER — OXYCODONE-ACETAMINOPHEN 5-325 MG PO TABS: 2.0000 | ORAL_TABLET | Freq: Four times a day (QID) | ORAL | 0 refills | Status: DC | PRN

## 2015-08-27 MED ORDER — TRAMADOL HCL 50 MG PO TABS: 100.0000 mg | ORAL_TABLET | Freq: Four times a day (QID) | ORAL | 0 refills | Status: DC

## 2015-08-27 NOTE — Progress Notes (Signed)
All chest tubes out.  Pain under good control.  Walking.  Not short of breath  Afebrile.  Wounds clean and dry.  Dressings changed.  Lungs with decreased breath sounds at the right base. Heart Regular  Discussed discharge with Jimmy Howell.  She will check with health department and Dr. Sampson GoonFitzgerald regarding followup. I will see in one week for suture removal.  Repeat CXRay this morning prior to discharge

## 2015-08-27 NOTE — Progress Notes (Signed)
Patient discharged to home, concerns addressed, IV site already out upon assessment. Dressing to lower back in place. Incision dry and intact. Discharge summary given to patient.

## 2015-08-27 NOTE — Anesthesia Post-op Follow-up Note (Signed)
  Anesthesia Pain Follow-up Note  Patient: Jimmy OverlieJeffrey L Happe  Day #: 5  Date of Follow-up: 08/27/2015 Time: 7:43 AM  Last Vitals:  Vitals:   08/26/15 2124 08/27/15 0600  BP: (!) 147/89 (!) 144/94  Pulse: 82 78  Resp:  18  Temp: 36.6 C 36.5 C    Level of Consciousness: alert  Pain: 1 /10   Side Effects:None  Catheter Site Exam:clean, dry, no drainage. Catheter was removed by surgeon at bedside on 08/26/15 per progress note     Plan: D/C from anesthesia care  Mathews ArgyleLogan,  Sabeen Piechocki P

## 2015-08-27 NOTE — Discharge Summary (Signed)
Physician Discharge Summary  Patient ID: Jimmy Howell MRN: [id number] DOB/AGE: [birth year] 45 y.o.  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 Because of the possibility of tuberculosis the patient was placed in isolation remained that way throughout his hospital course. On postoperative day 6 and 7 his chest tubes were removed and his wounds were healing as expected. He was not short of breath and was tolerating a regular diet with excellent pain control with oral narcotics.   Disposition: 01-Home or Self Care  Discharge Instructions    Call MD for:    Complete by:  As directed   Please make appointment for one week for suture removal and wound check.  Will need chest XRay as well.  Remove dressing in 24 hours and shower.   Call MD for:  difficulty breathing, headache or visual disturbances    Complete by:  As directed   Call MD for:  persistant nausea and vomiting    Complete by:  As directed   Call MD for:  redness, tenderness, or signs of infection (pain, swelling, redness, odor or green/yellow discharge around incision site)    Complete by:  As directed   Call MD for:  severe uncontrolled pain    Complete by:  As directed   Call MD for:  temperature >100.4    Complete by:  As directed   Diet - low sodium heart healthy    Complete by:  As directed   Increase activity slowly    Complete by:  As directed       Medication List    STOP taking these medications   mupirocin nasal  ointment 2 % Commonly known as:  BACTROBAN NASAL     TAKE these medications   albuterol 108 (90 Base) MCG/ACT inhaler Commonly known as:  PROVENTIL HFA;VENTOLIN HFA Inhale 2 puffs into the lungs every 4 (four) hours as needed for wheezing or shortness of breath.   buPROPion 75 MG tablet Commonly known as:  WELLBUTRIN Please take 150 mg (2 tablets) daily for 3 days, then increase to 150 mg (2 tablets) twice daily.   fluticasone 110 MCG/ACT inhaler Commonly known as:  FLOVENT HFA Inhale 2 puffs into the lungs 2 (two) times daily.   Glycopyrrolate-Formoterol 9-4.8 MCG/ACT Aero Commonly known as:  BEVESPI AEROSPHERE Inhale 2 puffs into the lungs 2 (two) times daily.   loratadine 10 MG tablet Commonly known as:  CLARITIN Take 10 mg by mouth daily.   nicotine 21 mg/24hr patch Commonly known as:  NICODERM CQ - dosed in mg/24 hours Place 21 mg onto the skin daily.   oxyCODONE-acetaminophen 5-325 MG tablet Commonly known as:  PERCOCET/ROXICET Take 2 tablets by mouth every 6 (six) hours as needed for moderate pain.   pantoprazole 40 MG tablet Commonly known as:  PROTONIX TAKE 1 TABLET (40 MG TOTAL) BY MOUTH 2 (TWO) TIMES DAILY.   traMADol 50 MG tablet Commonly known as:  ULTRAM Take 2 tablets (100 mg total) by mouth every 6 (six) hours.  Follow-up Information    Erin FullingKurian Kasa, MD. Go on 09/11/2015.   Specialties:  Pulmonary Disease, Cardiology Why:  @11 :15a Contact information: [address] [place] [address] [phone]           Jimmy Marinimothy Satoshi Kalas, MD

## 2015-08-27 NOTE — Progress Notes (Signed)
Notified Dr. Thelma Bargeaks of radiology result revealing 2% pneumothorax, acknowledged.

## 2015-08-28 ENCOUNTER — Telehealth: Payer: Self-pay

## 2015-08-28 DIAGNOSIS — R918 Other nonspecific abnormal finding of lung field: Secondary | ICD-10-CM

## 2015-08-28 NOTE — Telephone Encounter (Signed)
Jimmy Howell called stating that they were told that the health department will be contacting them by the end of this week to schedule an appointment. Jimmy Howell was informed that patient needs to have a chest X-Ray on Friday 08/31/2015 prior to seeing Dr. Thelma Bargeaks. Jimmy Howell understood.

## 2015-08-28 NOTE — Telephone Encounter (Signed)
Called patient to ask if he had heard from the Health Department. He stated that he had not. He stated that he understood that they will be contacting him. Patient will call me back to let know if he needs to call the Health Department or us calling them.  Patient needs to have Chest X-Ray prior to his appointment. [facility] to have this done. Orders are placed.

## 2015-08-29 LAB — QUANTIFERON IN TUBE
QFT TB AG MINUS NIL VALUE: <0 IU/mL
QUANTIFERON MITOGEN VALUE: 7.66 [IU]/mL
QUANTIFERON NIL VALUE: 0.04 [IU]/mL
QUANTIFERON TB AG VALUE: 0.03 [IU]/mL
QUANTIFERON TB GOLD: NEGATIVE

## 2015-08-29 LAB — QUANTIFERON TB GOLD ASSAY (BLOOD)

## 2015-08-30 LAB — SURGICAL PATHOLOGY

## 2015-08-31 ENCOUNTER — Encounter: Payer: Self-pay | Admitting: Cardiothoracic Surgery

## 2015-08-31 ENCOUNTER — Ambulatory Visit (INDEPENDENT_AMBULATORY_CARE_PROVIDER_SITE_OTHER): Payer: 59 | Admitting: Cardiothoracic Surgery

## 2015-08-31 ENCOUNTER — Ambulatory Visit
Admission: RE | Admit: 2015-08-31 | Discharge: 2015-08-31 | Disposition: A | Discharge status: Home / Self Care | Payer: 59 | Source: Ambulatory Visit | Attending: Cardiothoracic Surgery | Admitting: Cardiothoracic Surgery

## 2015-08-31 VITALS — BP 162/94 | HR 75 | Temp 98.2°F | Ht 69.0 in

## 2015-08-31 DIAGNOSIS — R911 Solitary pulmonary nodule: Secondary | ICD-10-CM

## 2015-08-31 DIAGNOSIS — R918 Other nonspecific abnormal finding of lung field: Secondary | ICD-10-CM | POA: Diagnosis not present

## 2015-08-31 DIAGNOSIS — J939 Pneumothorax, unspecified: Secondary | ICD-10-CM | POA: Diagnosis not present

## 2015-08-31 NOTE — Patient Instructions (Addendum)
You may shower as you normally do. The steri-strips will fall off in 7-10 days. Please do not put any ointments, creams, etc near this area until seen back in office.  We will see you back in office with Dr. Thelma Bargeaks in 1 month.  Please call our office with any questions or concerns.  Please do not submerge in a tub, hot tub, or pool until incisions are completely sealed.  Use sun block to incision area over the next year if this area will be exposed to sun. This helps decrease scarring.  You may now resume your normal activities at home with restriction of no more than 15 lbs. When lifting less than 15 lbs, Listen to your body when lifting, if you have pain when lifting, stop and then try again in a few days.  If you develop redness, drainage, or pain at incision sites- call our office immediately and speak with a nurse.

## 2015-08-31 NOTE — Progress Notes (Signed)
[name] returns today in follow-up. His final pathology showed Mycobacterium avium. He does not have Mycobacterium tuberculosis. He states that the pain is improving daily. He's had no fevers or chills. He's been walking quite a bit and his appetite is good.  His thoracotomy wound is well-healed. All suture sites look good. There is no erythema or drainage. His lungs show slightly diminished breath sounds on the right. His chest x-ray today looks good with only a minimal amount of air along the lateral thoracotomy wound. There is no pneumothorax or pleural effusion.  Overall he is improved proving and doing well after his surgery. We will remove his sutures and Steri-Stripped his wound. He will follow-up with Dr. Merrilee SeashoreKossa in 3 weeks and he will see me again in one month.

## 2015-09-05 ENCOUNTER — Telehealth: Payer: Self-pay | Admitting: Cardiothoracic Surgery

## 2015-09-05 MED ORDER — OXYCODONE-ACETAMINOPHEN 5-325 MG PO TABS: 2.0000 | ORAL_TABLET | Freq: Four times a day (QID) | ORAL | 0 refills | Status: DC | PRN

## 2015-09-05 MED ORDER — TRAMADOL HCL 50 MG PO TABS: 100.0000 mg | ORAL_TABLET | Freq: Four times a day (QID) | ORAL | 0 refills | Status: DC

## 2015-09-05 NOTE — Telephone Encounter (Signed)
Called Dr. Thelma Bargeaks and asked if it was okay for patient to get a refill on his pain medications. He stated that it was okay. He also asked if Dr. Everlene FarrierPabon could sign them of. I told him that I would ask.  Dr. Everlene FarrierPabon agreed on filling patient's prescription. Patient was called and so was his wife and I had to leave them a voicemail letting them know that patient's prescriptions were going to be left at the front desk for pick up.

## 2015-09-05 NOTE — Telephone Encounter (Signed)
Patient would like a refill on Oxycodone and Tramadol. Please call and advise.

## 2015-09-07 ENCOUNTER — Encounter: Payer: Self-pay | Admitting: Pathology

## 2015-09-11 ENCOUNTER — Ambulatory Visit (INDEPENDENT_AMBULATORY_CARE_PROVIDER_SITE_OTHER): Payer: 59 | Admitting: Internal Medicine

## 2015-09-11 ENCOUNTER — Encounter: Payer: Self-pay | Admitting: Internal Medicine

## 2015-09-11 VITALS — BP 136/88 | HR 82 | Ht 69.0 in | Wt 173.0 lb

## 2015-09-11 DIAGNOSIS — J449 Chronic obstructive pulmonary disease, unspecified: Secondary | ICD-10-CM | POA: Diagnosis not present

## 2015-09-11 MED ORDER — GLYCOPYRROLATE-FORMOTEROL 9-4.8 MCG/ACT IN AERO: 2.0000 | INHALATION_SPRAY | Freq: Every day | RESPIRATORY_TRACT | 0 refills | Status: AC

## 2015-09-11 NOTE — Progress Notes (Signed)
[facility] Consultation      Date: 09/11/2015,   MRN# [id number] Jimmy Howell May 29, 1970 Code Status:  Code Status History    This patient does not have a recorded code status. Please follow your organizational policy for patients in this situation.     Hosp day:@LENGTHOFSTAYDAYS @ Referring MD: @ATDPROV @     PCP:      AdmissionWeight: 173 lb (78.5 kg)                 CurrentWeight: 173 lb (78.5 kg) Jimmy Howell is a 45 y.o. old male seen in consultation for COPD and lung nodules at the request of Dr. Birdie Sons     CHIEF COMPLAINT:   Follow up COPD, s/p thoracotomy for RUL nodule   HISTORY OF PRESENT ILLNESS  No SOB, no wheezing, on  bevespi and Flovent and uses albuterol as needed No acute issues now, no signs of infection at this time Quit smoking 3 months ago On nicotine patch 21 mg   S/p thoracotomy revealed AFB POS organisms on RUL nodule Final micro results pending, NOT TB   PFT reviewed 04/2015 Ratio 61% FEV1 54% FVC 69% RV 324% TLC 154%      Current Outpatient Prescriptions:  .  albuterol (PROVENTIL HFA;VENTOLIN HFA) 108 (90 Base) MCG/ACT inhaler, Inhale 2 puffs into the lungs every 4 (four) hours as needed for wheezing or shortness of breath., Disp: 1 Inhaler, Rfl: 2 .  buPROPion (WELLBUTRIN) 75 MG tablet, Please take 150 mg (2 tablets) daily for 3 days, then increase to 150 mg (2 tablets) twice daily., Disp: 120 tablet, Rfl: 1 .  fluticasone (FLOVENT HFA) 110 MCG/ACT inhaler, Inhale 2 puffs into the lungs 2 (two) times daily., Disp: , Rfl:  .  Glycopyrrolate-Formoterol (BEVESPI AEROSPHERE) 9-4.8 MCG/ACT AERO, Inhale 2 puffs into the lungs 2 (two) times daily., Disp: 4.8 Inhaler, Rfl: 3 .  loratadine (CLARITIN) 10 MG tablet, Take 10 mg by mouth daily., Disp: , Rfl:  .  nicotine (NICODERM CQ - DOSED IN MG/24 HOURS) 21 mg/24hr patch, Place 21 mg onto the skin daily., Disp: , Rfl:  .  oxyCODONE-acetaminophen (PERCOCET/ROXICET)  5-325 MG tablet, Take 2 tablets by mouth every 6 (six) hours as needed for moderate pain., Disp: 30 tablet, Rfl: 0 .  pantoprazole (PROTONIX) 40 MG tablet, TAKE 1 TABLET (40 MG TOTAL) BY MOUTH 2 (TWO) TIMES DAILY., Disp: 60 tablet, Rfl: 3 .  traMADol (ULTRAM) 50 MG tablet, Take 2 tablets (100 mg total) by mouth every 6 (six) hours., Disp: 40 tablet, Rfl: 0    ALLERGIES   Chantix [varenicline]     REVIEW OF SYSTEMS   Review of Systems  Constitutional: Negative for chills, fever, malaise/fatigue and weight loss.  HENT: Negative for congestion.   Respiratory: Negative for cough, hemoptysis, sputum production, shortness of breath and wheezing.   Cardiovascular: Negative for chest pain, palpitations, orthopnea and leg swelling.  Gastrointestinal: Negative for heartburn and nausea.  Neurological: Negative for tingling.  Psychiatric/Behavioral: The patient is not nervous/anxious.   All other systems reviewed and are negative.    VS: BP 136/88 (BP Location: Left Arm, Cuff Size: Normal)   Pulse 82   Ht 5\' 9"  (1.753 m)   Wt 173 lb (78.5 kg)   SpO2 97%   BMI 25.55 kg/m      PHYSICAL EXAM  Physical Exam  Constitutional: He is oriented to person, place, and time. No distress.  Eyes: EOM are normal.  Cardiovascular: Normal  rate, regular rhythm and normal heart sounds.   No murmur heard. Pulmonary/Chest: Effort normal and breath sounds normal. No stridor. No respiratory distress. He has no wheezes. He has no rales.  Neurological: He is alert and oriented to person, place, and time.  Skin: Skin is warm. He is not diaphoretic.  Psychiatric: He has a normal mood and affect.  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 ventolin as needed 4.nictonie patched 21 mg 5.repeat ONO to assess for  hypoxia  For RUL nodule-infectious etiology NON TB micro results pending -no infectious symptoms at this time - follow up with Dr. Thelma Bargeaks in 1 month.   Follow up with ID once Micro results are final    follow up in 6 months for COPD   The Patient requires high complexity decision making for assessment and support, frequent evaluation and titration of therapies, application of advanced monitoring technologies and extensive interpretation of multiple databases.   Patient satisfied with Plan of action and management. All questions answered  Lucie LeatherKurian David Pura Picinich, M.D.  [facility]  Medical Director [facility] Medical Director [facility] Cardio-Pulmonary Department

## 2015-09-11 NOTE — Patient Instructions (Signed)
Continue inhalers as prescribed Follow up with Dr. Thelma Bargeaks  Chronic Obstructive Pulmonary Disease Chronic obstructive pulmonary disease (COPD) is a common lung condition in which airflow from the lungs is limited. COPD is a general term that can be used to describe many different lung problems that limit airflow, including both chronic bronchitis and emphysema. If you have COPD, your lung function will probably never return to normal, but there are measures you can take to improve lung function and make yourself feel better. CAUSES   Smoking (common).  Exposure to secondhand smoke.  Genetic problems.  Chronic inflammatory lung diseases or recurrent infections. SYMPTOMS  Shortness of breath, especially with physical activity.  Deep, persistent (chronic) cough with a large amount of thick mucus.  Wheezing.  Rapid breaths (tachypnea).  Gray or bluish discoloration (cyanosis) of the skin, especially in your fingers, toes, or lips.  Fatigue.  Weight loss.  Frequent infections or episodes when breathing symptoms become much worse (exacerbations).  Chest tightness. DIAGNOSIS Your health care provider will take a medical history and perform a physical examination to diagnose COPD. Additional tests for COPD may include:  Lung (pulmonary) function tests.  Chest X-ray.  CT scan.  Blood tests. TREATMENT  Treatment for COPD may include:  Inhaler and nebulizer medicines. These help manage the symptoms of COPD and make your breathing more comfortable.  Supplemental oxygen. Supplemental oxygen is only helpful if you have a low oxygen level in your blood.  Exercise and physical activity. These are beneficial for nearly all people with COPD.  Lung surgery or transplant.  Nutrition therapy to gain weight, if you are underweight.  Pulmonary rehabilitation. This may involve working with a team of health care providers and specialists, such as respiratory, occupational, and physical  therapists. HOME CARE INSTRUCTIONS  Take all medicines (inhaled or pills) as directed by your health care provider.  Avoid over-the-counter medicines or cough syrups that dry up your airway (such as antihistamines) and slow down the elimination of secretions unless instructed otherwise by your health care provider.  If you are a smoker, the most important thing that you can do is stop smoking. Continuing to smoke will cause further lung damage and breathing trouble. Ask your health care provider for help with quitting smoking. He or she can direct you to community resources or hospitals that provide support.  Avoid exposure to irritants such as smoke, chemicals, and fumes that aggravate your breathing.  Use oxygen therapy and pulmonary rehabilitation if directed by your health care provider. If you require home oxygen therapy, ask your health care provider whether you should purchase a pulse oximeter to measure your oxygen level at home.  Avoid contact with individuals who have a contagious illness.  Avoid extreme temperature and humidity changes.  Eat healthy foods. Eating smaller, more frequent meals and resting before meals may help you maintain your strength.  Stay active, but balance activity with periods of rest. Exercise and physical activity will help you maintain your ability to do things you want to do.  Preventing infection and hospitalization is very important when you have COPD. Make sure to receive all the vaccines your health care provider recommends, especially the pneumococcal and influenza vaccines. Ask your health care provider whether you need a pneumonia vaccine.  Learn and use relaxation techniques to manage stress.  Learn and use controlled breathing techniques as directed by your health care provider. Controlled breathing techniques include:  Pursed lip breathing. Start by breathing in (inhaling) through your nose for  1 second. Then, purse your lips as if you were  going to whistle and breathe out (exhale) through the pursed lips for 2 seconds.  Diaphragmatic breathing. Start by putting one hand on your abdomen just above your waist. Inhale slowly through your nose. The hand on your abdomen should move out. Then purse your lips and exhale slowly. You should be able to feel the hand on your abdomen moving in as you exhale.  Learn and use controlled coughing to clear mucus from your lungs. Controlled coughing is a series of short, progressive coughs. The steps of controlled coughing are: 1. Lean your head slightly forward. 2. Breathe in deeply using diaphragmatic breathing. 3. Try to hold your breath for 3 seconds. 4. Keep your mouth slightly open while coughing twice. 5. Spit any mucus out into a tissue. 6. Rest and repeat the steps once or twice as needed. SEEK MEDICAL CARE IF:  You are coughing up more mucus than usual.  There is a change in the color or thickness of your mucus.  Your breathing is more labored than usual.  Your breathing is faster than usual. SEEK IMMEDIATE MEDICAL CARE IF:  You have shortness of breath while you are resting.  You have shortness of breath that prevents you from:  Being able to talk.  Performing your usual physical activities.  You have chest pain lasting longer than 5 minutes.  Your skin color is more cyanotic than usual.  You measure low oxygen saturations for longer than 5 minutes with a pulse oximeter. MAKE SURE YOU:  Understand these instructions.  Will watch your condition.  Will get help right away if you are not doing well or get worse.   This information is not intended to replace advice given to you by your health care provider. Make sure you discuss any questions you have with your health care provider.   Document Released: 10/02/2004 Document Revised: 01/13/2014 Document Reviewed: 08/19/2012 Elsevier Interactive Patient Education [employer].

## 2015-09-13 ENCOUNTER — Telehealth: Payer: Self-pay

## 2015-09-13 ENCOUNTER — Telehealth: Payer: Self-pay | Admitting: Cardiothoracic Surgery

## 2015-09-13 NOTE — Telephone Encounter (Signed)
Patient of Dr Thelma Bargeaks would like a refill of pain medication.

## 2015-09-13 NOTE — Telephone Encounter (Signed)
[facility]'s questionnaire was filled out and faxed.

## 2015-09-14 MED ORDER — TRAMADOL HCL 50 MG PO TABS: 100.0000 mg | ORAL_TABLET | Freq: Four times a day (QID) | ORAL | 0 refills | Status: DC

## 2015-09-14 MED ORDER — OXYCODONE-ACETAMINOPHEN 5-325 MG PO TABS: 2.0000 | ORAL_TABLET | Freq: Four times a day (QID) | ORAL | 0 refills | Status: DC | PRN

## 2015-09-14 NOTE — Telephone Encounter (Signed)
Spoke with Dr. Thelma Bargeaks in regards to this patient. His prescription is available for pick-up in the [facility].  Call made to patient's wife, Arline AspCindy at this time. No answer. Left voicemail with this information asking for return phone call if she has any questions.

## 2015-09-17 DIAGNOSIS — J449 Chronic obstructive pulmonary disease, unspecified: Secondary | ICD-10-CM | POA: Diagnosis not present

## 2015-09-21 ENCOUNTER — Encounter: Payer: Self-pay | Admitting: Family Medicine

## 2015-09-21 ENCOUNTER — Ambulatory Visit (INDEPENDENT_AMBULATORY_CARE_PROVIDER_SITE_OTHER): Payer: 59 | Admitting: Family Medicine

## 2015-09-21 ENCOUNTER — Telehealth: Payer: Self-pay

## 2015-09-21 DIAGNOSIS — J8410 Pulmonary fibrosis, unspecified: Secondary | ICD-10-CM | POA: Diagnosis not present

## 2015-09-21 DIAGNOSIS — K219 Gastro-esophageal reflux disease without esophagitis: Secondary | ICD-10-CM | POA: Diagnosis not present

## 2015-09-21 MED ORDER — OXYCODONE-ACETAMINOPHEN 5-325 MG PO TABS: 1.0000 | ORAL_TABLET | Freq: Four times a day (QID) | ORAL | 0 refills | Status: DC | PRN

## 2015-09-21 MED ORDER — TRAMADOL HCL 50 MG PO TABS: 50.0000 mg | ORAL_TABLET | Freq: Four times a day (QID) | ORAL | 0 refills | Status: DC

## 2015-09-21 NOTE — Progress Notes (Signed)
  Marikay AlarEric Averly Ericson, MD Phone: [phone]  Jimmy OverlieJeffrey L Howell is a 45 y.o. male who presents today for follow-up.  Since his last visit the patient has had a thoracotomy and removal of one of his right lung lobes. He's been doing well since then. No shortness of breath or wheezing. Some pain at the incision site though no other pain. He is alternating tramadol and Percocet. It appears that the pathology report was noncancerous. He had workup for TB that was negative. 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 He's quit smoking. He is awaiting follow-up with surgery on 10/05/15. He has seen them once before and has followed up with pulmonology.  GERD: Patient reports this is not as bad since he quit smoking. He is down 1 Protonix a day. Occasionally gets burning and sour taste. No blood in his stool or abdominal pain.  PMH:  Former smoker  ROS see history of present illness  Objective  Physical Exam Vitals:   09/21/15 1315  BP: 118/76  Pulse: 99  Temp: 98.5 F (36.9 C)    BP Readings from Last 3 Encounters:  09/21/15 118/76  09/11/15 136/88  08/31/15 (!) 162/94   Wt Readings from Last 3 Encounters:  09/21/15 174 lb 12.8 oz (79.3 kg)  09/11/15 173 lb (78.5 kg)  08/20/15 197 lb 12 oz (89.7 kg)    Physical Exam  Constitutional: No distress.  Cardiovascular: Normal rate, regular rhythm and normal heart sounds.   Pulmonary/Chest: Effort normal and breath sounds normal.  Right-sided thoracotomy scar well healed and nontender with no surrounding erythema, chest tube scars well healed with no tenderness and no surrounding erythema  Neurological: He is alert. Gait normal.  Skin: Skin is warm and dry. He is not diaphoretic.     Assessment/Plan: Please see individual problem list.  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 Still awaiting final results. Doing quite well following this. Advised to continue to follow-up  with surgery and pulmonology. Given return precautions.  Acid reflux Improving. Encouraged to continue protonix at this time and we will discuss coming off of this once he is over his lung surgery.  Marikay AlarEric Travers Goodley, MD [facility]

## 2015-09-21 NOTE — Telephone Encounter (Signed)
Patient was prescribed Tramadol 50 MG 2 tabs every 6 hours and Oxycodone 5-325 MG 2 tabs every 6 hours. Patients wife, Arline AspCindy, has called to see if he can get those medications refilled. I [facility] the phone call [facility] to [facility].

## 2015-09-21 NOTE — Telephone Encounter (Signed)
Spoke with Dr. Thelma Bargeaks regarding this patient. He would like to refill medications for 20 tablets each, change directions for 1 tablet every 6 hours. This has been done.   Prescriptions have been given to patient's wife, Mikey BussingCindy Derick and she was notified of changes in directions and amount.  Patient to follow-up in office on 10/05/15 as scheduled.

## 2015-09-21 NOTE — Assessment & Plan Note (Addendum)
Patient status post lobectomy and thoracotomy revealing granulomatous tissue in his lung. Still awaiting final results. Doing quite well following this. Advised to continue to follow-up with surgery and pulmonology. Given return precautions.

## 2015-09-21 NOTE — Assessment & Plan Note (Signed)
Improving. Encouraged to continue protonix at this time and we will discuss coming off of this once he is over his lung surgery.

## 2015-09-21 NOTE — Telephone Encounter (Signed)
Spoke with patient's wife whom explained that the patient tried taking just 800mg  Ibuprofen alternated with Tylenol this week for 2 days and was very uncomfortable during this time. She would like to have her husband start weaning off of the medications because the pain is not unbearable but would like recommendations on how to do this.  I explained that I would speak with Dr. Thelma Bargeaks in regards to this and call the patient or patient's wife back with recommendations.

## 2015-09-21 NOTE — Patient Instructions (Signed)
Nice to see you. I am glad you are doing better following your surgery. Continue Protonix. The chest pain, shortness of breath, cough productive of blood, fevers, or any new or changing symptoms please seek medical attention.

## 2015-09-21 NOTE — Progress Notes (Signed)
Pre visit review using our clinic review tool, if applicable. No additional management support is needed unless otherwise documented below in the visit note. 

## 2015-09-24 ENCOUNTER — Telehealth: Payer: Self-pay

## 2015-09-24 NOTE — Telephone Encounter (Signed)
Disability Form was filled out and faxed. 

## 2015-10-02 ENCOUNTER — Telehealth: Payer: Self-pay | Admitting: Cardiothoracic Surgery

## 2015-10-02 ENCOUNTER — Other Ambulatory Visit: Payer: Self-pay | Admitting: Internal Medicine

## 2015-10-02 DIAGNOSIS — J449 Chronic obstructive pulmonary disease, unspecified: Secondary | ICD-10-CM

## 2015-10-02 MED ORDER — OXYCODONE-ACETAMINOPHEN 5-325 MG PO TABS: 1.0000 | ORAL_TABLET | Freq: Four times a day (QID) | ORAL | 0 refills | Status: DC | PRN

## 2015-10-02 NOTE — Telephone Encounter (Signed)
Spoke with Dr. Thelma Bargeaks and asked if there was a possibility that he could write him a refill on his Oxycodone and Tramadol. Dr. Thelma Bargeaks agreed on giving him Oxycodone but no Tramadol. Patient is to start weaning off his pain medications. This will be told to his wife Arline AspCindy.  Called patient's wife Arline AspCindy and explained what Dr. Thelma Bargeaks wants him to do and she understood. I told her that his prescription will be left at the front desk.

## 2015-10-02 NOTE — Telephone Encounter (Signed)
Patient would like a refill on his pain medication. Please call and advise.

## 2015-10-05 ENCOUNTER — Encounter: Payer: Self-pay | Admitting: Cardiothoracic Surgery

## 2015-10-06 DIAGNOSIS — S199XXA Unspecified injury of neck, initial encounter: Secondary | ICD-10-CM | POA: Diagnosis not present

## 2015-10-06 DIAGNOSIS — R51 Headache: Secondary | ICD-10-CM | POA: Diagnosis not present

## 2015-10-06 DIAGNOSIS — S0990XA Unspecified injury of head, initial encounter: Secondary | ICD-10-CM | POA: Diagnosis not present

## 2015-10-06 DIAGNOSIS — S0101XA Laceration without foreign body of scalp, initial encounter: Secondary | ICD-10-CM | POA: Diagnosis not present

## 2015-10-06 DIAGNOSIS — S0191XA Laceration without foreign body of unspecified part of head, initial encounter: Secondary | ICD-10-CM | POA: Diagnosis not present

## 2015-10-06 DIAGNOSIS — Z23 Encounter for immunization: Secondary | ICD-10-CM | POA: Diagnosis not present

## 2015-10-06 DIAGNOSIS — R220 Localized swelling, mass and lump, head: Secondary | ICD-10-CM | POA: Diagnosis not present

## 2015-10-08 ENCOUNTER — Other Ambulatory Visit: Payer: Self-pay | Admitting: Cardiothoracic Surgery

## 2015-10-08 DIAGNOSIS — R911 Solitary pulmonary nodule: Secondary | ICD-10-CM

## 2015-10-09 ENCOUNTER — Encounter: Payer: Self-pay | Admitting: Cardiothoracic Surgery

## 2015-10-09 ENCOUNTER — Ambulatory Visit (INDEPENDENT_AMBULATORY_CARE_PROVIDER_SITE_OTHER): Payer: 59 | Admitting: Cardiothoracic Surgery

## 2015-10-09 ENCOUNTER — Ambulatory Visit
Admission: RE | Admit: 2015-10-09 | Discharge: 2015-10-09 | Disposition: A | Discharge status: Home / Self Care | Payer: 59 | Source: Ambulatory Visit | Attending: Cardiothoracic Surgery | Admitting: Cardiothoracic Surgery

## 2015-10-09 VITALS — BP 149/97 | HR 74 | Temp 98.1°F | Wt 181.0 lb

## 2015-10-09 DIAGNOSIS — Z9889 Other specified postprocedural states: Secondary | ICD-10-CM | POA: Insufficient documentation

## 2015-10-09 DIAGNOSIS — R911 Solitary pulmonary nodule: Secondary | ICD-10-CM

## 2015-10-09 DIAGNOSIS — R918 Other nonspecific abnormal finding of lung field: Secondary | ICD-10-CM

## 2015-10-09 DIAGNOSIS — J9811 Atelectasis: Secondary | ICD-10-CM | POA: Diagnosis not present

## 2015-10-09 NOTE — Progress Notes (Signed)
  Patient ID: Jimmy Howell, male   DOB: [birth year], 45 y.o.   MRN: [id number]  HISTORY: He returns today in follow-up. He does not really complain of any significant shortness of breath but his main complaint is that related to postoperative discomfort. He describes skin sensitivity along the lower aspect of his right hemithorax. Doesn't really describe this as a deep-seated musculoskeletal pain but rather as a superficial discomfort almost like a dysesthesia or paresthesia.  His appetite has been good and he has gained weight.   Vitals:   10/09/15 0826  BP: (!) 149/97  Pulse: 74  Temp: 98.1 F (36.7 C)     EXAM:    Resp: Lungs are clear bilaterally.  No respiratory distress, normal effort. Heart:  Regular without murmurs Abd:  Abdomen is soft, non distended and non tender. No masses are palpable.  There is no rebound and no guarding.  Neurological: Alert and oriented to person, place, and time. Coordination normal.  Skin: Skin is warm and dry. No rash noted. No diaphoretic. No erythema. No pallor.  Psychiatric: Normal mood and affect. Normal behavior. Judgment and thought content normal.    ASSESSMENT: I have independently reviewed his chest x-ray. There is no untoward complications.   PLAN:   I have asked him to make an appointment with Dr. 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 I will see him back again in 6 weeks which will be his 3 month anniversary from the date of his surgery. I told him to stop taking Tylenol in between the Percocet as they contain similar products. I also told him to try topical analgesics such as Lidoderm or other over-the-counter products for his skin sensitivity.    Hulda Marinimothy Kaylenn Civil, MD

## 2015-10-09 NOTE — Patient Instructions (Addendum)
The air pocket that you had on your lung is gone. Your Chest X-ray looks great!  Please continue to see Dr. Belia HemanKasa in 6 months.  We will see you in 6 weeks. On this visit we will ask for you to have a Chest X-Ray prior to your appointment.   We will send a referral for you to see Dr. Sampson GoonFitzgerald.  Try Salonpas patch on your Right Lower Quadrant. See if this could help you.  Stop taking Tylenol at this time. Instead take Ibuprofen for pain.  Take Percocet every four hours.

## 2015-10-09 NOTE — Addendum Note (Signed)
Addended by: Adela PortsBONICHE, Lamae Fosco on: 10/09/2015 11:34 AM   Modules accepted: Orders

## 2015-10-12 ENCOUNTER — Encounter: Payer: Self-pay | Admitting: Cardiothoracic Surgery

## 2015-10-16 DIAGNOSIS — A310 Pulmonary mycobacterial infection: Secondary | ICD-10-CM | POA: Diagnosis not present

## 2015-10-16 DIAGNOSIS — J410 Simple chronic bronchitis: Secondary | ICD-10-CM | POA: Diagnosis not present

## 2015-10-16 DIAGNOSIS — R918 Other nonspecific abnormal finding of lung field: Secondary | ICD-10-CM | POA: Diagnosis not present

## 2015-10-17 DIAGNOSIS — J449 Chronic obstructive pulmonary disease, unspecified: Secondary | ICD-10-CM | POA: Diagnosis not present

## 2015-10-19 ENCOUNTER — Telehealth: Payer: Self-pay | Admitting: *Deleted

## 2015-10-19 NOTE — Telephone Encounter (Signed)
Called back and spoke with patients wife. She stated that when the patient saw you 2 weeks ago he did not go into detail about what he is feeling. She stated that he is very depressed because of being out of work for so long. He had surgery in August and the doctor has taken him out of work for another 6 weeks ( 3 months total). Wife is concerned because husband has started drinking heavy since he was taken out of work. He does not drink daily, but does drink at least 3-4 days a week. He usually drinks 4-6 liquor on those days. 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 He is going to put him on an ABX 18 months,. The ID doctor told the patient that he can't consume alcohol while on this medication. Per patients wife he has a party to go to tonight and the doctor told him to drink like he was going to and start the ABX on Monday. Patients wife wanted you to know that the patient is not suicidal just upset due to not being able to go back to work.

## 2015-10-19 NOTE — Telephone Encounter (Signed)
Pt wife stated that pt is suffering from depression following his thoracotomy surgery. Pt has been scheduled for 10-29-15 to be seen by Dr. De NurseSonneberg to discuss depression. Pt's wife requested a call to discuss this matter prior to the appt, to make sure this would be the correct route for pt. [employer]indy [phone]

## 2015-10-19 NOTE — Telephone Encounter (Signed)
Noted. Please let the patient and his wife know that given the amount he is drinking he should not quit drinking suddenly as this will place him at risk for alcohol withdrawal which can be dangerous and deadly. I would suggest he [place] [facility] [place] at [phone] to get seen as soon as possible to discuss alcohol detoxification. I would also advise them to contact his infectious disease physician and let them know how much he has been drinking and that I have advised that it is not safe to stop drinking suddenly given the amount he is currently drinking.

## 2015-10-19 NOTE — Telephone Encounter (Signed)
Patients wife called back and I gave the message from Dr. Birdie SonsSonnenberg. Gave the number for the [facility], but patients wife stated that he probably will not do this. She verbalized understanding to the message.

## 2015-10-19 NOTE — Telephone Encounter (Signed)
Please advise 

## 2015-10-19 NOTE — Telephone Encounter (Signed)
LM for patient to return call to the office

## 2015-10-23 ENCOUNTER — Ambulatory Visit (INDEPENDENT_AMBULATORY_CARE_PROVIDER_SITE_OTHER): Payer: 59 | Admitting: Family Medicine

## 2015-10-23 ENCOUNTER — Telehealth: Payer: Self-pay | Admitting: Cardiothoracic Surgery

## 2015-10-23 ENCOUNTER — Encounter: Payer: Self-pay | Admitting: Family Medicine

## 2015-10-23 VITALS — BP 122/84 | HR 92 | Temp 98.2°F | Wt 187.4 lb

## 2015-10-23 DIAGNOSIS — F418 Other specified anxiety disorders: Secondary | ICD-10-CM | POA: Diagnosis not present

## 2015-10-23 DIAGNOSIS — F1010 Alcohol abuse, uncomplicated: Secondary | ICD-10-CM | POA: Insufficient documentation

## 2015-10-23 DIAGNOSIS — R1011 Right upper quadrant pain: Secondary | ICD-10-CM | POA: Diagnosis not present

## 2015-10-23 DIAGNOSIS — F329 Major depressive disorder, single episode, unspecified: Secondary | ICD-10-CM | POA: Insufficient documentation

## 2015-10-23 DIAGNOSIS — F321 Major depressive disorder, single episode, moderate: Secondary | ICD-10-CM | POA: Diagnosis not present

## 2015-10-23 DIAGNOSIS — F32A Depression, unspecified: Secondary | ICD-10-CM | POA: Insufficient documentation

## 2015-10-23 DIAGNOSIS — H2513 Age-related nuclear cataract, bilateral: Secondary | ICD-10-CM | POA: Diagnosis not present

## 2015-10-23 DIAGNOSIS — A310 Pulmonary mycobacterial infection: Secondary | ICD-10-CM | POA: Diagnosis not present

## 2015-10-23 DIAGNOSIS — F419 Anxiety disorder, unspecified: Secondary | ICD-10-CM

## 2015-10-23 MED ORDER — LIDOCAINE 5 % EX PTCH: 1.0000 | MEDICATED_PATCH | Freq: Two times a day (BID) | CUTANEOUS | 1 refills | Status: DC

## 2015-10-23 MED ORDER — SERTRALINE HCL 50 MG PO TABS: ORAL_TABLET | ORAL | 3 refills | Status: DC

## 2015-10-23 NOTE — Telephone Encounter (Signed)
Would Dr Thelma Bargeaks prescribe Lidoderm patch for patient and call it into the hospital [facility]

## 2015-10-23 NOTE — Assessment & Plan Note (Signed)
Patient getting ready to start mycobacterial treatment through infectious disease. He was advised to limit his alcohol intake though he did not inform the infectious disease physician how much he was drinking. Given his level of alcohol abuse I feel it is not safe for him to all of the sudden stop drinking alcohol as it would place him at risk for withdrawal. I have advised him to contact his infectious disease physician and inform him of the amount he drinks. I provided him with the number to the [facility] [place] for alcohol detoxification and advise the patient to call them as soon as he leaves the office to set up an appointment for as soon as possible. If he does not get an appointment with them by tomorrow he will call and let us know. Advised of potential alcohol withdrawal symptoms and if these occur he needs to seek medical attention immediately.

## 2015-10-23 NOTE — Assessment & Plan Note (Signed)
Patient with fairly significant anxiety and depression following recent medical illnesses. Somewhat not to be unexpected given everything he has been through. Discussed this at length with the patient. Discussed seeing a counselor and medication. He is willing to do both. We'll start him on Zoloft. We will get him set up with psychology. We will see him back in 4 weeks.

## 2015-10-23 NOTE — Assessment & Plan Note (Signed)
See mycobacterial infection problem for plan.

## 2015-10-23 NOTE — Progress Notes (Signed)
Marikay Alar, MD Phone: [phone]  Jimmy Howell is a 45 y.o. male who presents today for follow-up.  Patient notes depression and anxiety over the last 3 or so weeks related to his recent medical issues and being taken out of work following thoracotomy and lobectomy. Notes he has been drinking about a fifth of liquor 3 times a week. Prior to that he was drinking 3-4 alcoholic beverages 3-4 times a week. Notes he was advised by his infectious disease physician that he would need to stop drinking given upcoming treatment for MAI. The patient reports he did not tell of the ID physician the extent of his alcohol use. Patient notes no SI with his depression and anxiety. Notes his last drink was on Sunday. He plans to start on his antibiotics on Wednesday.  Depression screen PHQ 2/9 10/23/2015  Decreased Interest 0  Down, Depressed, Hopeless 2  PHQ - 2 Score 2  Altered sleeping 3  Tired, decreased energy 3  Change in appetite 3  Feeling bad or failure about yourself  2  Trouble concentrating 0  Moving slowly or fidgety/restless 1  Suicidal thoughts 0  PHQ-9 Score 14  Difficult doing work/chores Very difficult   GAD 7 : Generalized Anxiety Score 10/23/2015  Nervous, Anxious, on Edge 3  Control/stop worrying 3  Worry too much - different things 2  Trouble relaxing 3  Restless 1  Easily annoyed or irritable 3  Afraid - awful might happen 0  Total GAD 7 Score 15    Patient additionally notes some discomfort in his right upper quadrant following surgery for a thoracotomy and lobectomy. He was advised by his surgeon to use lidocaine patches over-the-counter. These have not been beneficial. He has not had any nausea, vomiting, or diarrhea. Normal bowel movements. No fevers. Review of chest CT revealed a granuloma in his liver. He had a PET scan prior to his surgery that did not reveal any abnormal uptake in the area of the liver.  PMH: Former smoker.   ROS see history of present  illness  Objective  Physical Exam Vitals:   10/23/15 1106  BP: 122/84  Pulse: 92  Temp: 98.2 F (36.8 C)    BP Readings from Last 3 Encounters:  10/23/15 122/84  10/09/15 (!) 149/97  09/21/15 118/76   Wt Readings from Last 3 Encounters:  10/23/15 187 lb 6 oz (85 kg)  10/09/15 181 lb (82.1 kg)  09/21/15 174 lb 12.8 oz (79.3 kg)    Physical Exam  Constitutional: He is well-developed, well-nourished, and in no distress.  Cardiovascular: Normal rate, regular rhythm and normal heart sounds.   Pulmonary/Chest: Effort normal and breath sounds normal.  Abdominal: Soft. Bowel sounds are normal. He exhibits no distension. There is tenderness (right upper quadrant tenderness). There is no rebound and no guarding.  Musculoskeletal: He exhibits no edema.  Neurological: He is alert. Gait normal.  Skin: Skin is warm and dry.     Assessment/Plan: Please see individual problem list.  Anxiety and depression Patient with fairly significant anxiety and depression following recent medical illnesses. Somewhat not to be unexpected given everything he has been through. Discussed this at length with the patient. Discussed seeing a counselor and medication. He is willing to do both. We'll start him on Zoloft. We will get him set up with psychology. We will see him back in 4 weeks.  Mycobacterial disease, pulmonary (HCC) Patient getting ready to start mycobacterial treatment through infectious disease. He was advised to limit  his alcohol intake though he did not inform the infectious disease physician how much he was drinking. Given his level of alcohol abuse I feel it is not safe for him to all of the sudden stop drinking alcohol as it would place him at risk for withdrawal. I have advised him to contact his infectious disease physician and inform him of the amount he drinks. I provided him with the number to the [facility] [place] for alcohol detoxification and advise the patient to call  them as soon as he leaves the office to set up an appointment for as soon as possible. If he does not get an appointment with them by tomorrow he will call and let us know. Advised of potential alcohol withdrawal symptoms and if these occur he needs to seek medical attention immediately.  Alcohol abuse See mycobacterial infection problem for plan.  Abdominal pain, right upper quadrant Patient with pain in his right upper quadrant even to light touch following his surgery about 2 months ago. Recent lab work reviewed in care everywhere revealing normal LFTs and a normal white blood cell count. Given this it is less likely to be a gallbladder or a liver issue. Could be related to nerve injury from surgery or how he was positioned during surgery. We will obtain an ultrasound to evaluate further. Given return precautions.   Orders Placed This Encounter  Procedures  . US Abdomen Complete    Standing Status:   Future    Standing Expiration Date:   12/22/2016    Order Specific Question:   Reason for Exam (SYMPTOM  OR DIAGNOSIS REQUIRED)    Answer:   RUQ pain since thoracotomy    Order Specific Question:   Preferred imaging location?    Answer:    [facility]  . Ambulatory referral to Psychology    Referral Priority:   Routine    Referral Type:   Psychiatric    Referral Reason:   Specialty Services Required    Requested Specialty:   Psychology    Number of Visits Requested:   1    Meds ordered this encounter  Medications  . sertraline (ZOLOFT) 50 MG tablet    Sig: Please take 25 mg (half a tablet) by mouth daily for 7 days, then take 50 mg (one tablet) by mouth daily    Dispense:  30 tablet    Refill:  3    Marikay AlarEric Nguyet Mercer, MD [facility]

## 2015-10-23 NOTE — Telephone Encounter (Signed)
Obtained notification from Dr. Thelma Bargeaks at this time to send in Lido-derm patches to pharmacy.   This order has been sent to pharmacy.  Patient's wife Arline AspCindy was made aware of this.

## 2015-10-23 NOTE — Patient Instructions (Addendum)
Nice to see you. We are going to start you on Zoloft for depression and anxiety. We are going to have you call the [facility] in [place] at the following number [phone] to try to set up an appointment for the next 1-2 days. If you're unable to get into see them quickly please let us know. Please do not quit drinking all at once. We are going to obtain an ultrasound to evaluate your pain.

## 2015-10-23 NOTE — Assessment & Plan Note (Signed)
Patient with pain in his right upper quadrant even to light touch following his surgery about 2 months ago. Recent lab work reviewed in care everywhere revealing normal LFTs and a normal white blood cell count. Given this it is less likely to be a gallbladder or a liver issue. Could be related to nerve injury from surgery or how he was positioned during surgery. We will obtain an ultrasound to evaluate further. Given return precautions.

## 2015-10-29 ENCOUNTER — Ambulatory Visit: Payer: 59 | Admitting: Family Medicine

## 2015-10-30 ENCOUNTER — Ambulatory Visit
Admission: RE | Admit: 2015-10-30 | Discharge: 2015-10-30 | Disposition: A | Discharge status: Home / Self Care | Payer: 59 | Source: Ambulatory Visit | Attending: Family Medicine | Admitting: Family Medicine

## 2015-10-30 DIAGNOSIS — K760 Fatty (change of) liver, not elsewhere classified: Secondary | ICD-10-CM | POA: Insufficient documentation

## 2015-10-30 DIAGNOSIS — R1011 Right upper quadrant pain: Secondary | ICD-10-CM | POA: Insufficient documentation

## 2015-11-02 DIAGNOSIS — Z760 Encounter for issue of repeat prescription: Secondary | ICD-10-CM | POA: Diagnosis not present

## 2015-11-08 ENCOUNTER — Other Ambulatory Visit: Payer: Self-pay | Admitting: Internal Medicine

## 2015-11-16 DIAGNOSIS — R918 Other nonspecific abnormal finding of lung field: Secondary | ICD-10-CM | POA: Diagnosis not present

## 2015-11-16 DIAGNOSIS — A310 Pulmonary mycobacterial infection: Secondary | ICD-10-CM | POA: Diagnosis not present

## 2015-11-16 DIAGNOSIS — J410 Simple chronic bronchitis: Secondary | ICD-10-CM | POA: Diagnosis not present

## 2015-11-17 DIAGNOSIS — J449 Chronic obstructive pulmonary disease, unspecified: Secondary | ICD-10-CM | POA: Diagnosis not present

## 2015-11-23 ENCOUNTER — Encounter: Payer: Self-pay | Admitting: Cardiothoracic Surgery

## 2015-11-23 ENCOUNTER — Ambulatory Visit
Admission: RE | Admit: 2015-11-23 | Discharge: 2015-11-23 | Disposition: A | Discharge status: Home / Self Care | Payer: 59 | Source: Ambulatory Visit | Attending: Cardiothoracic Surgery | Admitting: Cardiothoracic Surgery

## 2015-11-23 ENCOUNTER — Ambulatory Visit (INDEPENDENT_AMBULATORY_CARE_PROVIDER_SITE_OTHER): Payer: 59 | Admitting: Cardiothoracic Surgery

## 2015-11-23 VITALS — BP 148/102 | HR 80 | Temp 97.3°F | Wt 188.0 lb

## 2015-11-23 DIAGNOSIS — Z9889 Other specified postprocedural states: Secondary | ICD-10-CM | POA: Insufficient documentation

## 2015-11-23 DIAGNOSIS — R918 Other nonspecific abnormal finding of lung field: Secondary | ICD-10-CM

## 2015-11-23 DIAGNOSIS — J42 Unspecified chronic bronchitis: Secondary | ICD-10-CM | POA: Diagnosis not present

## 2015-11-23 DIAGNOSIS — J449 Chronic obstructive pulmonary disease, unspecified: Secondary | ICD-10-CM | POA: Diagnosis not present

## 2015-11-23 NOTE — Patient Instructions (Signed)
Please give us a call if you have any questions or concerns.  When you have your CT Scan done, do not hesitate to call me so I could review it.

## 2015-11-23 NOTE — Progress Notes (Signed)
  Patient ID: Jimmy Howell, male   DOB: [birth year], 45 y.o.   MRN: [id number]  HISTORY: He returns today in follow-up. 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 He has seen Dr. Clydie Braunavid Fitzgerald and he has been placed on triple antibiotic therapy. He will remain on that for at least a year. He states that his pain is much improved and he would like to get back to work. 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 He states that his pain is minimal and that he is not short of breath   Vitals:   11/23/15 0851  BP: (!) 148/102  Pulse: 80  Temp: 97.3 F (36.3 C)     EXAM:    Resp: Lungs are clear bilaterally.  No respiratory distress, normal effort. Heart:  Regular without murmurs His thoracotomy wound is well  healed.  Neurological: Alert and oriented to person, place, and time. Coordination normal.  Skin: Skin is warm and dry. No rash noted. No diaphoretic. No erythema. No pallor.  Psychiatric: Normal mood and affect. Normal behavior. Judgment and thought content normal.    ASSESSMENT: I have independently reviewed his chest x-ray. There are no complicating features. Overall looks quite good.   PLAN:   I do not see any reason why he could not return to work. He did not request any refills on any of his medications today. He will continue his follow-up with Dr. Sampson GoonFitzgerald and I will review his chest CT which she will have made in the next couple weeks. All his questions were answered.    Jimmy Marinimothy Noemi Bellissimo, MD

## 2015-11-26 ENCOUNTER — Other Ambulatory Visit: Payer: Self-pay | Admitting: *Deleted

## 2015-11-26 DIAGNOSIS — J449 Chronic obstructive pulmonary disease, unspecified: Secondary | ICD-10-CM

## 2015-12-05 DIAGNOSIS — J449 Chronic obstructive pulmonary disease, unspecified: Secondary | ICD-10-CM | POA: Diagnosis not present

## 2015-12-12 DIAGNOSIS — R918 Other nonspecific abnormal finding of lung field: Secondary | ICD-10-CM | POA: Diagnosis not present

## 2015-12-12 DIAGNOSIS — A310 Pulmonary mycobacterial infection: Secondary | ICD-10-CM | POA: Diagnosis not present

## 2015-12-12 DIAGNOSIS — J8410 Pulmonary fibrosis, unspecified: Secondary | ICD-10-CM | POA: Diagnosis not present

## 2015-12-13 ENCOUNTER — Other Ambulatory Visit: Payer: Self-pay | Admitting: Infectious Diseases

## 2015-12-13 DIAGNOSIS — A310 Pulmonary mycobacterial infection: Secondary | ICD-10-CM

## 2015-12-13 DIAGNOSIS — R918 Other nonspecific abnormal finding of lung field: Secondary | ICD-10-CM

## 2015-12-13 DIAGNOSIS — J8410 Pulmonary fibrosis, unspecified: Secondary | ICD-10-CM

## 2015-12-17 DIAGNOSIS — J449 Chronic obstructive pulmonary disease, unspecified: Secondary | ICD-10-CM | POA: Diagnosis not present

## 2015-12-21 ENCOUNTER — Ambulatory Visit: Payer: 59 | Admitting: Family Medicine

## 2015-12-21 ENCOUNTER — Telehealth: Payer: Self-pay | Admitting: Internal Medicine

## 2015-12-21 DIAGNOSIS — J449 Chronic obstructive pulmonary disease, unspecified: Secondary | ICD-10-CM

## 2015-12-21 DIAGNOSIS — Z0289 Encounter for other administrative examinations: Secondary | ICD-10-CM

## 2015-12-21 NOTE — Telephone Encounter (Signed)
Pt wife would like pulse oximeter results. Also needs to discuss oxygen . Please call home contact 1st, and cell 2nd [phone]

## 2015-12-21 NOTE — Telephone Encounter (Signed)
Pt wife is requesting pt ONO results, pt aware that pt did not wear machine all time and study would have to be repeated. Pt wife states pt had a sleep study and O2 level did not drop during sleep. Pt states he has attempted this test 4 times and has a hard time keeping it on his finger. Pt is refusing to repeat this test. Pt is requesting that oxygen be picked up.  DK please advise.

## 2015-12-24 NOTE — Telephone Encounter (Signed)
What ever patient wants

## 2015-12-25 NOTE — Telephone Encounter (Signed)
Pt has requested an order be placed to d/c night time use oxygen. Order has been placed. Pt voiced understanding and had no further questions. nothing further needed.

## 2015-12-25 NOTE — Telephone Encounter (Signed)
Lmtcb. Will await call back 

## 2015-12-25 NOTE — Telephone Encounter (Signed)
Pt calling back  °Please call  °

## 2015-12-28 LAB — ACID FAST SMEAR (AFB): ACID FAST SMEAR - AFSCU2: NEGATIVE

## 2015-12-28 LAB — ACID FAST CULTURE WITH REFLEXED SENSITIVITIES: ACID FAST CULTURE - AFSCU3: NEGATIVE

## 2016-01-10 ENCOUNTER — Ambulatory Visit (INDEPENDENT_AMBULATORY_CARE_PROVIDER_SITE_OTHER): Payer: 59 | Admitting: Family Medicine

## 2016-01-10 ENCOUNTER — Encounter: Payer: Self-pay | Admitting: Family Medicine

## 2016-01-10 DIAGNOSIS — R21 Rash and other nonspecific skin eruption: Secondary | ICD-10-CM | POA: Diagnosis not present

## 2016-01-10 MED ORDER — TRIAMCINOLONE ACETONIDE 0.1 % EX CREA: 1.0000 "application " | TOPICAL_CREAM | Freq: Two times a day (BID) | CUTANEOUS | 0 refills | Status: DC

## 2016-01-10 NOTE — Progress Notes (Signed)
  Jimmy AlarEric Ejay Lashley, MD Phone: [phone]  Jimmy Howell is a 46 y.o. male who presents today for same-day visit.  Rash: Has been present for the last 2-3 weeks. Just on the ulnar aspect of his bilateral forearms. Notes occasional itching between his fingers and a rash there. Notes no fevers. No changes in soaps or detergents. No changes at home. No travel. Does note his oldest son had scabies about a month ago and was treated for this. He has been on antibiotics for MAI infection though has been off of these for the last 2-3 weeks to see if that was contributing. The rash has not improved with being off the antibiotics.  ROS see history of present illness  Objective  Physical Exam Vitals:   01/10/16 0823 01/10/16 0835  BP: (!) 134/96 132/90  Pulse: 86   Temp: 97.8 F (36.6 C)     BP Readings from Last 3 Encounters:  01/10/16 132/90  11/23/15 (!) 148/102  10/23/15 122/84   Wt Readings from Last 3 Encounters:  01/10/16 201 lb 3.2 oz (91.3 kg)  11/23/15 188 lb (85.3 kg)  10/23/15 187 lb 6 oz (85 kg)    Physical Exam  Constitutional: He is well-developed, well-nourished, and in no distress.  Cardiovascular: Normal rate, regular rhythm and normal heart sounds.   Pulmonary/Chest: Effort normal and breath sounds normal.  Neurological: He is alert. Gait normal.  Skin:  Bilateral forearms and the ulnar aspect with mild papular erythematous rash with excoriations, lesions are nontender, no surrounding erythema, no rash on abdomen or back or on hands     Assessment/Plan: Please see individual problem list.  Rash and nonspecific skin eruption Fairly nonspecific rash. Unlikely to be scabies given appearance of lesions and distribution. Could be a allergic dermatitis. Doubt a reaction to his antibiotics particularly given that he has been off the antibiotics for 2-3 weeks without improvement. We will treat with topical triamcinolone. He'll continue to monitor. He is given return  precautions.   No orders of the defined types were placed in this encounter.   Meds ordered this encounter  Medications  . triamcinolone cream (KENALOG) 0.1 %    Sig: Apply 1 application topically 2 (two) times daily.    Dispense:  30 g    Refill:  0    Jimmy AlarEric Odus Clasby, MD [facility] [place]

## 2016-01-10 NOTE — Progress Notes (Signed)
Pre visit review using our clinic review tool, if applicable. No additional management support is needed unless otherwise documented below in the visit note. 

## 2016-01-10 NOTE — Assessment & Plan Note (Signed)
Fairly nonspecific rash. Unlikely to be scabies given appearance of lesions and distribution. Could be a allergic dermatitis. Doubt a reaction to his antibiotics particularly given that he has been off the antibiotics for 2-3 weeks without improvement. We will treat with topical triamcinolone. He'll continue to monitor. He is given return precautions.

## 2016-01-10 NOTE — Patient Instructions (Signed)
Nice to see you. We will treat your rash with a topical steroid. Please place this on the areas with bumps that are itching. Please contact your infectious disease physician and let them know what we discussed today. Please see if they want to go back on the antibiotics. If you develop spreading rash, fevers, or any new or changing symptoms please seek medical attention immediately.

## 2016-01-17 DIAGNOSIS — J449 Chronic obstructive pulmonary disease, unspecified: Secondary | ICD-10-CM | POA: Diagnosis not present

## 2016-01-23 ENCOUNTER — Ambulatory Visit: Payer: 59 | Admitting: Family Medicine

## 2016-02-08 ENCOUNTER — Other Ambulatory Visit: Payer: Self-pay | Admitting: Internal Medicine

## 2016-02-08 DIAGNOSIS — J449 Chronic obstructive pulmonary disease, unspecified: Secondary | ICD-10-CM

## 2016-02-12 ENCOUNTER — Ambulatory Visit (INDEPENDENT_AMBULATORY_CARE_PROVIDER_SITE_OTHER): Payer: 59 | Admitting: Family Medicine

## 2016-02-12 ENCOUNTER — Encounter: Payer: Self-pay | Admitting: Family Medicine

## 2016-02-12 VITALS — BP 128/88 | HR 77 | Temp 98.1°F | Wt 203.6 lb

## 2016-02-12 DIAGNOSIS — F329 Major depressive disorder, single episode, unspecified: Secondary | ICD-10-CM

## 2016-02-12 DIAGNOSIS — K219 Gastro-esophageal reflux disease without esophagitis: Secondary | ICD-10-CM

## 2016-02-12 DIAGNOSIS — F418 Other specified anxiety disorders: Secondary | ICD-10-CM

## 2016-02-12 DIAGNOSIS — R6882 Decreased libido: Secondary | ICD-10-CM | POA: Insufficient documentation

## 2016-02-12 DIAGNOSIS — R21 Rash and other nonspecific skin eruption: Secondary | ICD-10-CM | POA: Diagnosis not present

## 2016-02-12 DIAGNOSIS — F419 Anxiety disorder, unspecified: Secondary | ICD-10-CM

## 2016-02-12 DIAGNOSIS — F32A Depression, unspecified: Secondary | ICD-10-CM

## 2016-02-12 MED ORDER — RABEPRAZOLE SODIUM 20 MG PO TBEC: 20.0000 mg | DELAYED_RELEASE_TABLET | Freq: Every day | ORAL | 0 refills | Status: DC

## 2016-02-12 NOTE — Progress Notes (Signed)
Pre visit review using our clinic review tool, if applicable. No additional management support is needed unless otherwise documented below in the visit note. 

## 2016-02-12 NOTE — Progress Notes (Signed)
  Jimmy AlarEric Nikisha Fleece, MD Phone: [phone]  Jimmy Howell is a 46 y.o. male who presents today for follow-up.  GERD: Patient notes Protonix has not been beneficial. Notes he gets reflux most days. Notes it's a burning and sour taste. Tried dexilant through his wife no their insurance will no longer cover this. No abdominal pain or blood in his stool.  Anxiety/depression: Patient notes no symptoms. He is back to work and this is significantly better. No longer taking Zoloft. No SI or HI.  Rash: They found out this was related to rifampin. He stopped this medication and the rash went away. He continues on his other medications for Mycobacterium infection. He follows up with infectious disease in April.  Patient also notes decreased sexual drive. Notes the Zoloft made this worse. He is able to get an erection though he just has no desire. Wonders if his testosterone should be checked.  PMH: Former smoker   ROS see history of present illness  Objective  Physical Exam Vitals:   02/12/16 1535  BP: 128/88  Pulse: 77  Temp: 98.1 F (36.7 C)    BP Readings from Last 3 Encounters:  02/12/16 128/88  01/10/16 132/90  11/23/15 (!) 148/102   Wt Readings from Last 3 Encounters:  02/12/16 203 lb 9.6 oz (92.4 kg)  01/10/16 201 lb 3.2 oz (91.3 kg)  11/23/15 188 lb (85.3 kg)    Physical Exam  Constitutional: No distress.  Cardiovascular: Normal rate, regular rhythm and normal heart sounds.   Pulmonary/Chest: Effort normal and breath sounds normal.  Abdominal: Soft. Bowel sounds are normal. He exhibits no distension. There is no tenderness. There is no rebound and no guarding.  Musculoskeletal: He exhibits no edema.  Neurological: He is alert. Gait normal.  Skin: Skin is warm and dry. He is not diaphoretic.     Assessment/Plan: Please see individual problem list.  Acid reflux Uncontrolled on Protonix. We will try AcipHex. If AcipHex is too expensive he can try Nexium and he is  provided with a paper prescription for this. We'll refer to GI.  Anxiety and depression Asymptomatic. Monitor for recurrence.  Rash and nonspecific skin eruption Suspect related to rifampin. Has resolved since discontinuing this. Monitor for recurrence.  Decreased sex drive Decreased sex drive. We'll plan on checking his testosterone at his next visit. Advised to make the appointment early in the morning.   Orders Placed This Encounter  Procedures  . Ambulatory referral to Gastroenterology    Referral Priority:   Routine    Referral Type:   Consultation    Referral Reason:   Specialty Services Required    Number of Visits Requested:   1    Meds ordered this encounter  Medications  . RABEprazole (ACIPHEX) 20 MG tablet    Sig: Take 1 tablet (20 mg total) by mouth daily.    Dispense:  30 tablet    Refill:  0    Jimmy AlarEric Shaune Westfall, MD [facility] [place]

## 2016-02-12 NOTE — Assessment & Plan Note (Addendum)
Uncontrolled on Protonix. We will try AcipHex. If AcipHex is too expensive he can try Nexium and he is provided with a paper prescription for this. We'll refer to GI.

## 2016-02-12 NOTE — Patient Instructions (Signed)
Nice to see you. We'll start you on AcipHex for your reflux. If the AcipHex is not beneficial you can try Nexium. Please let us know which one you end up taking. We will also refer you to GI. We'll plan on checking a testosterone at your next visit. Please schedule the appointment for early morning.

## 2016-02-12 NOTE — Assessment & Plan Note (Signed)
Decreased sex drive. We'll plan on checking his testosterone at his next visit. Advised to make the appointment early in the morning.

## 2016-02-12 NOTE — Assessment & Plan Note (Signed)
Suspect related to rifampin. Has resolved since discontinuing this. Monitor for recurrence.

## 2016-02-12 NOTE — Assessment & Plan Note (Signed)
Asymptomatic.  Monitor for recurrence. 

## 2016-02-14 ENCOUNTER — Encounter: Payer: Self-pay | Admitting: Gastroenterology

## 2016-02-15 ENCOUNTER — Telehealth: Payer: Self-pay | Admitting: Family Medicine

## 2016-02-15 NOTE — Telephone Encounter (Signed)
Pt called and stated that he has an URI. He is c/o cough, chest congestion, and a low grade fever. He would like to know if Dr. Birdie SonsSonnenberg would call something in. Please advise, thank you!  Pharmacy - [facility] [place]  Call pt @ [phone]

## 2016-02-15 NOTE — Telephone Encounter (Signed)
Spoke with patient states Dr Sampson GoonFitzgerald is calling in Tamiflu for him, if he continues to not get better he will seek treatment.

## 2016-02-17 DIAGNOSIS — J449 Chronic obstructive pulmonary disease, unspecified: Secondary | ICD-10-CM | POA: Diagnosis not present

## 2016-02-27 ENCOUNTER — Encounter: Payer: Self-pay | Admitting: Gastroenterology

## 2016-03-13 ENCOUNTER — Other Ambulatory Visit: Payer: Self-pay | Admitting: Internal Medicine

## 2016-03-13 ENCOUNTER — Other Ambulatory Visit: Payer: Self-pay | Admitting: Family Medicine

## 2016-03-13 NOTE — Telephone Encounter (Signed)
Last filled 02/12/16 30 0rf last OV 02/12/16

## 2016-03-16 DIAGNOSIS — J449 Chronic obstructive pulmonary disease, unspecified: Secondary | ICD-10-CM | POA: Diagnosis not present

## 2016-04-15 ENCOUNTER — Ambulatory Visit: Payer: 59 | Admitting: Family

## 2016-04-15 NOTE — Progress Notes (Deleted)
Subjective:    Patient ID: Jimmy Howell, male    DOB: 10-Aug-1970, 46 y.o.   MRN: [id number]  CC: Jimmy Howell is a 46 y.o. male who presents today for an acute visit.    HPI: CC: cough  Former smoker  h/o copd, myobacterial disease  ? Alcohol, Labs  s/p right upper lobectomy; on triple antibiotic therapy ( myambutol, zithromax and rifadin) for one year per dr fitzgerald  HISTORY:  Past Medical History:  Diagnosis Date  . Asthma   . Chickenpox   . COPD (chronic obstructive pulmonary disease) (HCC)   . GERD (gastroesophageal reflux disease)   . Restless leg syndrome   . Shortness of breath dyspnea    with exertion   Past Surgical History:  Procedure Laterality Date  . FINGER SURGERY    . HERNIA REPAIR     Inguinal Hernia Repair  . VIDEO ASSISTED THORACOSCOPY (VATS)/THOROCOTOMY Right 08/20/2015   Procedure: PREOP BRONCH, THORACOTOMY, RIGHT UPPER LOBECTOMY;  Surgeon: Hulda Marin, MD;  Location: [facility] ORS;  Service: General;  Laterality: Right;   Family History  Problem Relation Age of Onset  . Bladder Cancer Father   . Cancer Paternal Aunt     stomach  . Arthritis      Parent  . Hyperlipidemia      Parent  . Heart disease      Parent  . Hypertension      Parent  . Kidney disease      Parent    Allergies: Chantix [varenicline] Current Outpatient Prescriptions on File Prior to Visit  Medication Sig Dispense Refill  . azithromycin (ZITHROMAX) 500 MG tablet Take 1 tablet by mouth 1 day or 1 dose.    Marland Kitchen BEVESPI AEROSPHERE 9-4.8 MCG/ACT AERO INHALE 2 PUFFS INTO THE LUNGS 2 TIMES DAILY 10.7 g 3  . ethambutol (MYAMBUTOL) 400 MG tablet Take 1 tablet by mouth 2 (two) times daily.    Marland Kitchen FLOVENT HFA 110 MCG/ACT inhaler INHALE 2 PUFFS INTO THE LUNGS 2 TIMES DAILY 12 g 2  . loratadine (CLARITIN) 10 MG tablet Take 10 mg by mouth daily.    . RABEprazole (ACIPHEX) 20 MG tablet TAKE 1 TABLET BY MOUTH DAILY. 30 tablet 3  . rifampin (RIFADIN) 300 MG capsule Take 1 capsule by  mouth 2 (two) times daily.    . sertraline (ZOLOFT) 50 MG tablet Please take 25 mg (half a tablet) by mouth daily for 7 days, then take 50 mg (one tablet) by mouth daily 30 tablet 3  . triamcinolone cream (KENALOG) 0.1 % Apply 1 application topically 2 (two) times daily. 30 g 0  . VENTOLIN HFA 108 (90 Base) MCG/ACT inhaler INHALE 2 PUFFS INTO THE LUNGS EVERY 4 HOURS AS NEEDED FOR WHEEZING OR SHORTNESS OF BREATH. 18 g 2   No current facility-administered medications on file prior to visit.     Social History  Substance Use Topics  . Smoking status: Former Smoker    Packs/day: 0.50    Types: Cigarettes    Quit date: 07/07/2015  . Smokeless tobacco: Never Used  . Alcohol use 0.0 oz/week     Comment: 1 Case of Beer / Weekly    Review of Systems    Objective:    There were no vitals taken for this visit.   Physical Exam     Assessment & Plan:      I am having [name] maintain his loratadine, sertraline, rifampin, ethambutol, azithromycin, triamcinolone cream, VENTOLIN HFA,  FLOVENT HFA, BEVESPI AEROSPHERE, and RABEprazole.   No orders of the defined types were placed in this encounter.   Return precautions given.   Risks, benefits, and alternatives of the medications and treatment plan prescribed today were discussed, and patient expressed understanding.   Education regarding symptom management and diagnosis given to patient on AVS.  Continue to follow with Marikay Alar, MD for routine health maintenance.   Jimmy Howell and I agreed with plan.   Rennie Plowman, FNP

## 2016-04-16 DIAGNOSIS — J449 Chronic obstructive pulmonary disease, unspecified: Secondary | ICD-10-CM | POA: Diagnosis not present

## 2016-05-02 ENCOUNTER — Ambulatory Visit
Admission: RE | Admit: 2016-05-02 | Discharge: 2016-05-02 | Disposition: A | Discharge status: Home / Self Care | Payer: 59 | Source: Ambulatory Visit | Attending: Infectious Diseases | Admitting: Infectious Diseases

## 2016-05-02 DIAGNOSIS — K760 Fatty (change of) liver, not elsewhere classified: Secondary | ICD-10-CM | POA: Insufficient documentation

## 2016-05-02 DIAGNOSIS — A310 Pulmonary mycobacterial infection: Secondary | ICD-10-CM | POA: Diagnosis not present

## 2016-05-02 DIAGNOSIS — Z902 Acquired absence of lung [part of]: Secondary | ICD-10-CM | POA: Diagnosis not present

## 2016-05-02 DIAGNOSIS — R911 Solitary pulmonary nodule: Secondary | ICD-10-CM | POA: Diagnosis not present

## 2016-05-02 DIAGNOSIS — R918 Other nonspecific abnormal finding of lung field: Secondary | ICD-10-CM | POA: Diagnosis not present

## 2016-05-02 DIAGNOSIS — J8410 Pulmonary fibrosis, unspecified: Secondary | ICD-10-CM

## 2016-05-14 DIAGNOSIS — Z792 Long term (current) use of antibiotics: Secondary | ICD-10-CM | POA: Diagnosis not present

## 2016-05-14 DIAGNOSIS — A310 Pulmonary mycobacterial infection: Secondary | ICD-10-CM | POA: Diagnosis not present

## 2016-05-14 DIAGNOSIS — J8410 Pulmonary fibrosis, unspecified: Secondary | ICD-10-CM | POA: Diagnosis not present

## 2016-05-19 ENCOUNTER — Other Ambulatory Visit: Payer: Self-pay | Admitting: Internal Medicine

## 2016-06-13 ENCOUNTER — Other Ambulatory Visit: Payer: Self-pay | Admitting: Internal Medicine

## 2016-06-13 DIAGNOSIS — J449 Chronic obstructive pulmonary disease, unspecified: Secondary | ICD-10-CM

## 2016-07-16 DIAGNOSIS — J449 Chronic obstructive pulmonary disease, unspecified: Secondary | ICD-10-CM | POA: Diagnosis not present

## 2016-08-01 ENCOUNTER — Other Ambulatory Visit: Payer: Self-pay | Admitting: Family Medicine

## 2016-08-14 ENCOUNTER — Other Ambulatory Visit: Payer: Self-pay | Admitting: Internal Medicine

## 2016-08-16 DIAGNOSIS — J449 Chronic obstructive pulmonary disease, unspecified: Secondary | ICD-10-CM | POA: Diagnosis not present

## 2016-09-16 ENCOUNTER — Other Ambulatory Visit: Payer: Self-pay | Admitting: Internal Medicine

## 2016-09-16 DIAGNOSIS — J449 Chronic obstructive pulmonary disease, unspecified: Secondary | ICD-10-CM | POA: Diagnosis not present

## 2016-09-22 ENCOUNTER — Encounter: Payer: Self-pay | Admitting: Internal Medicine

## 2016-10-16 DIAGNOSIS — J449 Chronic obstructive pulmonary disease, unspecified: Secondary | ICD-10-CM | POA: Diagnosis not present

## 2016-10-20 ENCOUNTER — Ambulatory Visit (INDEPENDENT_AMBULATORY_CARE_PROVIDER_SITE_OTHER): Payer: 59 | Admitting: Internal Medicine

## 2016-10-20 ENCOUNTER — Encounter: Payer: Self-pay | Admitting: Internal Medicine

## 2016-10-20 VITALS — BP 140/102 | HR 84 | Ht 69.0 in | Wt 210.0 lb

## 2016-10-20 DIAGNOSIS — J449 Chronic obstructive pulmonary disease, unspecified: Secondary | ICD-10-CM

## 2016-10-20 NOTE — Progress Notes (Signed)
[facility] Consultation      Date: 10/20/2016,   MRN# [id number] Jimmy Howell [birth year] Code Status:  Code Status History    This patient does not have a recorded code status. Please follow your organizational policy for patients in this situation.     Hosp day:@LENGTHOFSTAYDAYS @ Referring MD: @     PCP:      AdmissionWeight: 210 lb (95.3 kg)                 CurrentWeight: 210 lb (95.3 kg) Jimmy Howell is a 46 y.o. old male seen in consultation for COPD and lung nodules at the request of Dr. 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  azithromycin (ZITHROMAX) 500 MG tablet, Take 1 tablet by mouth 1 day or 1 dose., Disp: , Rfl:  .  BEVESPI AEROSPHERE 9-4.8 MCG/ACT AERO, INHALE 2 PUFFS INTO THE LUNGS 2 TIMES DAILY, Disp: 10.7 g, Rfl: 0 .  FLOVENT HFA 110 MCG/ACT inhaler, INHALE 2 PUFFS INTO THE LUNGS 2 TIMES DAILY, Disp: 12 g, Rfl: 0 .  loratadine (CLARITIN) 10 MG tablet, Take 10 mg by mouth daily., Disp: , Rfl:  .  RABEprazole (ACIPHEX) 20 MG tablet, TAKE 1 TABLET BY MOUTH DAILY., Disp: 30 tablet, Rfl: 3 .  triamcinolone cream (KENALOG) 0.1 %, Apply 1 application topically 2 (two) times daily., Disp: 30 g, Rfl: 0 .  VENTOLIN HFA 108 (90 Base) MCG/ACT inhaler, INHALE 2 PUFFS  INTO THE LUNGS EVERY 4 HOURS AS NEEDED FOR WHEEZING OR SHORTNESS OF BREATH., Disp: 18 g, Rfl: 2    ALLERGIES   Chantix [varenicline]    BP (!) 140/102 (BP Location: Left Arm, Cuff Size: Normal)   Pulse 84   Ht  (1.753 m)   Wt 210 lb (95.3 kg)   SpO2 98%   BMI 31.01 kg/m    REVIEW OF SYSTEMS   Review of Systems  Constitutional: Negative for chills, fever, malaise/fatigue and weight loss.  HENT: Negative for congestion.   Respiratory: Negative for cough, hemoptysis, sputum production, shortness of breath and wheezing.   Cardiovascular: Negative for chest pain, palpitations, orthopnea and leg swelling.  Gastrointestinal: Negative for heartburn and nausea.  Neurological: Negative for tingling.  Psychiatric/Behavioral: The patient is not nervous/anxious.   All other systems reviewed and are negative.   .vs.vs    PHYSICAL EXAM  Physical Exam  Constitutional: He is oriented to person, place, and time. No distress.  Eyes: EOM are normal.  Cardiovascular: Normal rate, regular rhythm and normal heart sounds.   No murmur heard. Pulmonary/Chest: Effort normal and breath sounds normal. No stridor. No respiratory distress. He has no wheezes. He has no rales.  Neurological: He  is alert and oriented to person, place, and time.  Skin: Skin is warm. He is not diaphoretic.  Psychiatric: He has a normal mood and affect.    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 ventolin as needed 4.nicotine patched 21  mg   For RUL nodule-infectious etiology NON TB micro treated for MAI -no infectious symptoms at this time     follow up in 1 year  Assessment of COPD    Patient satisfied with Plan of action and management. All questions answered  Lucie Leather, M.D.  [facility]  Medical Director [facility] Medical Director [facility] Cardio-Pulmonary Department

## 2016-10-20 NOTE — Patient Instructions (Addendum)
Follow up in 1 year Continue inhalers as prescribed Hold Flovent at this time

## 2016-10-27 ENCOUNTER — Ambulatory Visit: Payer: 59 | Admitting: Internal Medicine

## 2016-11-04 ENCOUNTER — Other Ambulatory Visit: Payer: Self-pay | Admitting: Internal Medicine

## 2016-11-12 DIAGNOSIS — J8410 Pulmonary fibrosis, unspecified: Secondary | ICD-10-CM | POA: Diagnosis not present

## 2016-11-12 DIAGNOSIS — R918 Other nonspecific abnormal finding of lung field: Secondary | ICD-10-CM | POA: Diagnosis not present

## 2016-11-12 DIAGNOSIS — A310 Pulmonary mycobacterial infection: Secondary | ICD-10-CM | POA: Diagnosis not present

## 2016-11-13 ENCOUNTER — Other Ambulatory Visit: Payer: Self-pay | Admitting: Infectious Diseases

## 2016-11-13 DIAGNOSIS — A310 Pulmonary mycobacterial infection: Secondary | ICD-10-CM

## 2016-11-13 DIAGNOSIS — R918 Other nonspecific abnormal finding of lung field: Secondary | ICD-10-CM

## 2016-12-12 ENCOUNTER — Other Ambulatory Visit: Payer: Self-pay | Admitting: Family Medicine

## 2016-12-12 ENCOUNTER — Other Ambulatory Visit: Payer: Self-pay | Admitting: Internal Medicine

## 2016-12-16 DIAGNOSIS — J449 Chronic obstructive pulmonary disease, unspecified: Secondary | ICD-10-CM | POA: Diagnosis not present

## 2016-12-25 ENCOUNTER — Other Ambulatory Visit: Payer: Self-pay | Admitting: Internal Medicine

## 2017-01-16 DIAGNOSIS — J449 Chronic obstructive pulmonary disease, unspecified: Secondary | ICD-10-CM | POA: Diagnosis not present

## 2017-02-16 DIAGNOSIS — J449 Chronic obstructive pulmonary disease, unspecified: Secondary | ICD-10-CM | POA: Diagnosis not present

## 2017-02-17 ENCOUNTER — Telehealth: Payer: Self-pay | Admitting: Internal Medicine

## 2017-02-17 MED ORDER — IPRATROPIUM BROMIDE HFA 17 MCG/ACT IN AERS: 2.0000 | INHALATION_SPRAY | Freq: Four times a day (QID) | RESPIRATORY_TRACT | 2 refills | Status: DC

## 2017-02-17 NOTE — Telephone Encounter (Signed)
Patient is temporarily without insurance for the next 60 days and is unable to pay $300 for bevespi inhaler   Can we provide samples or can patient try something cheaper in the interim while waiting on insurance   Please call wife cindy to discuss

## 2017-02-17 NOTE — Telephone Encounter (Signed)
We can provide samples--what do we have?  Or we can try Atrovent 2 puffs BID

## 2017-02-17 NOTE — Telephone Encounter (Signed)
Spoke with patient's spouse and made aware Atrovent has been sent to pharmacy. Nothing further needed.

## 2017-03-16 DIAGNOSIS — J449 Chronic obstructive pulmonary disease, unspecified: Secondary | ICD-10-CM | POA: Diagnosis not present

## 2017-04-02 ENCOUNTER — Other Ambulatory Visit: Payer: Self-pay | Admitting: Internal Medicine

## 2017-04-07 ENCOUNTER — Telehealth: Payer: Self-pay | Admitting: *Deleted

## 2017-04-07 NOTE — Telephone Encounter (Signed)
Called patient to verify his insurance info. Patient now has [employer] and we do not have a copy of card. Patient did not have card on hand. Patient will have his wife call back with ID #.  PA needed for Atrovent HFA . Covermymeds is not recognizing patient.

## 2017-04-07 NOTE — Telephone Encounter (Signed)
Patient wife brought in [place]   Also , Atrovent is more expensive and patient prefers to go back to [place] which will require a PA as well.

## 2017-04-08 MED ORDER — GLYCOPYRROLATE-FORMOTEROL 9-4.8 MCG/ACT IN AERO: 2.0000 | INHALATION_SPRAY | Freq: Two times a day (BID) | RESPIRATORY_TRACT | 6 refills | Status: DC

## 2017-04-08 NOTE — Telephone Encounter (Signed)
Pt notified rx sent.

## 2017-04-08 NOTE — Telephone Encounter (Signed)
[facility]

## 2017-04-14 ENCOUNTER — Ambulatory Visit
Admission: RE | Admit: 2017-04-14 | Discharge: 2017-04-14 | Disposition: A | Discharge status: Home / Self Care | Payer: BLUE CROSS/BLUE SHIELD | Source: Ambulatory Visit | Attending: Infectious Diseases | Admitting: Infectious Diseases

## 2017-04-14 ENCOUNTER — Ambulatory Visit: Payer: 59

## 2017-04-14 DIAGNOSIS — A310 Pulmonary mycobacterial infection: Secondary | ICD-10-CM

## 2017-04-14 DIAGNOSIS — I2510 Atherosclerotic heart disease of native coronary artery without angina pectoris: Secondary | ICD-10-CM | POA: Insufficient documentation

## 2017-04-14 DIAGNOSIS — Z902 Acquired absence of lung [part of]: Secondary | ICD-10-CM | POA: Diagnosis not present

## 2017-04-14 DIAGNOSIS — R918 Other nonspecific abnormal finding of lung field: Secondary | ICD-10-CM

## 2017-04-16 DIAGNOSIS — J449 Chronic obstructive pulmonary disease, unspecified: Secondary | ICD-10-CM | POA: Diagnosis not present

## 2017-04-17 ENCOUNTER — Telehealth: Payer: Self-pay | Admitting: *Deleted

## 2017-04-17 NOTE — Telephone Encounter (Signed)
Pt informed [employer] will not cover Bevespi. Pt must try and fail Anoro and Stiolto Respimat or something in that class. Pt currently on Atrovent HFA what else would you add or replace Bevespi with. Please advise.

## 2017-04-20 MED ORDER — UMECLIDINIUM-VILANTEROL 62.5-25 MCG/INH IN AEPB: 1.0000 | INHALATION_SPRAY | Freq: Every day | RESPIRATORY_TRACT | 5 refills | Status: DC

## 2017-04-20 NOTE — Telephone Encounter (Signed)
Please Replace atrovent and [facility]vespi [facility]

## 2017-04-20 NOTE — Telephone Encounter (Signed)
Pt aware.

## 2017-04-25 IMAGING — CR DG CHEST 2V
1 series · 2 of 2 positions shown · non-contrast
Comparison: CT 07/13/2015.

CLINICAL DATA: Lung surgery.  Smoking history.  Preop chest x-ray.

EXAM:
CHEST  2 VIEW

[Series 1: w chest pa · 0.14mm/px · 2 of 2 slices shown]
[im 1/2]
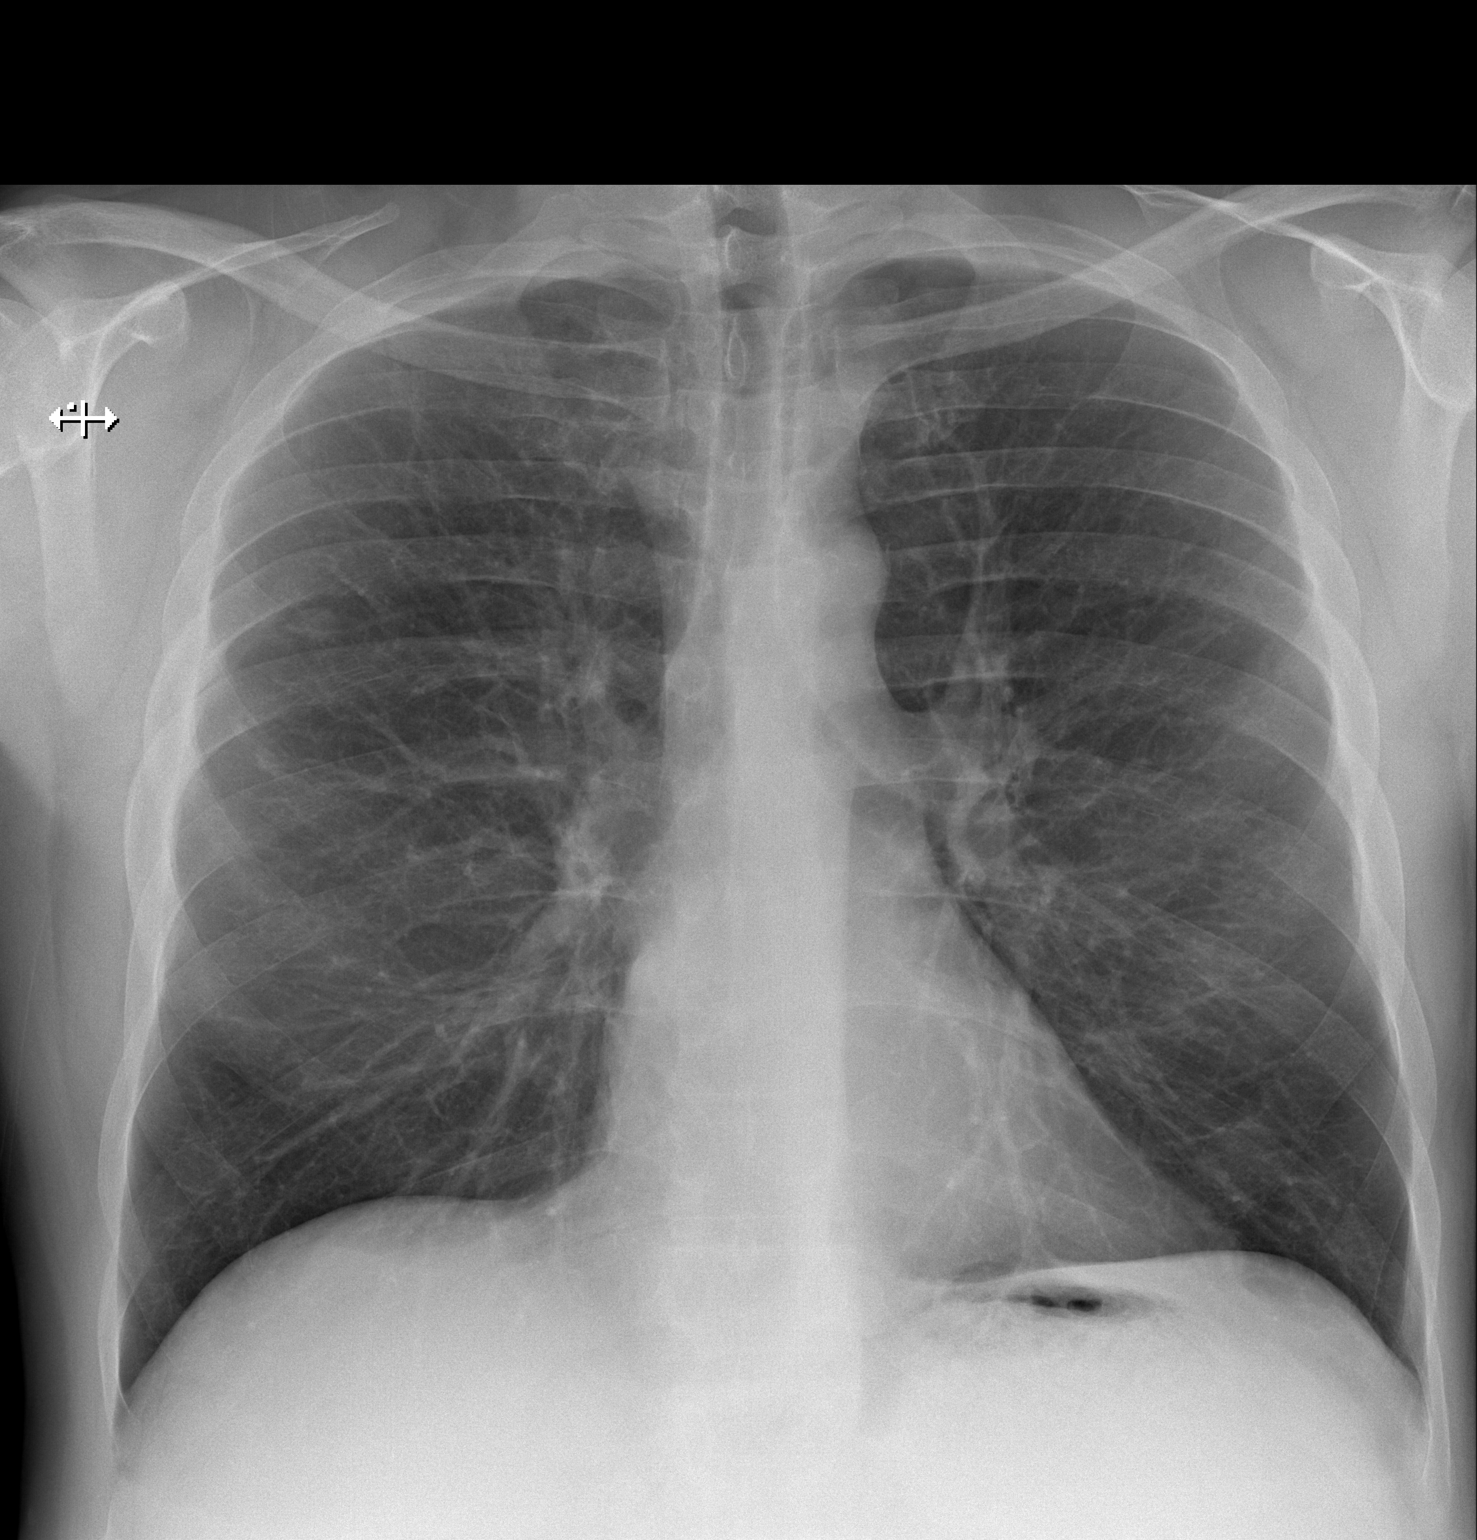
[im 2/2]
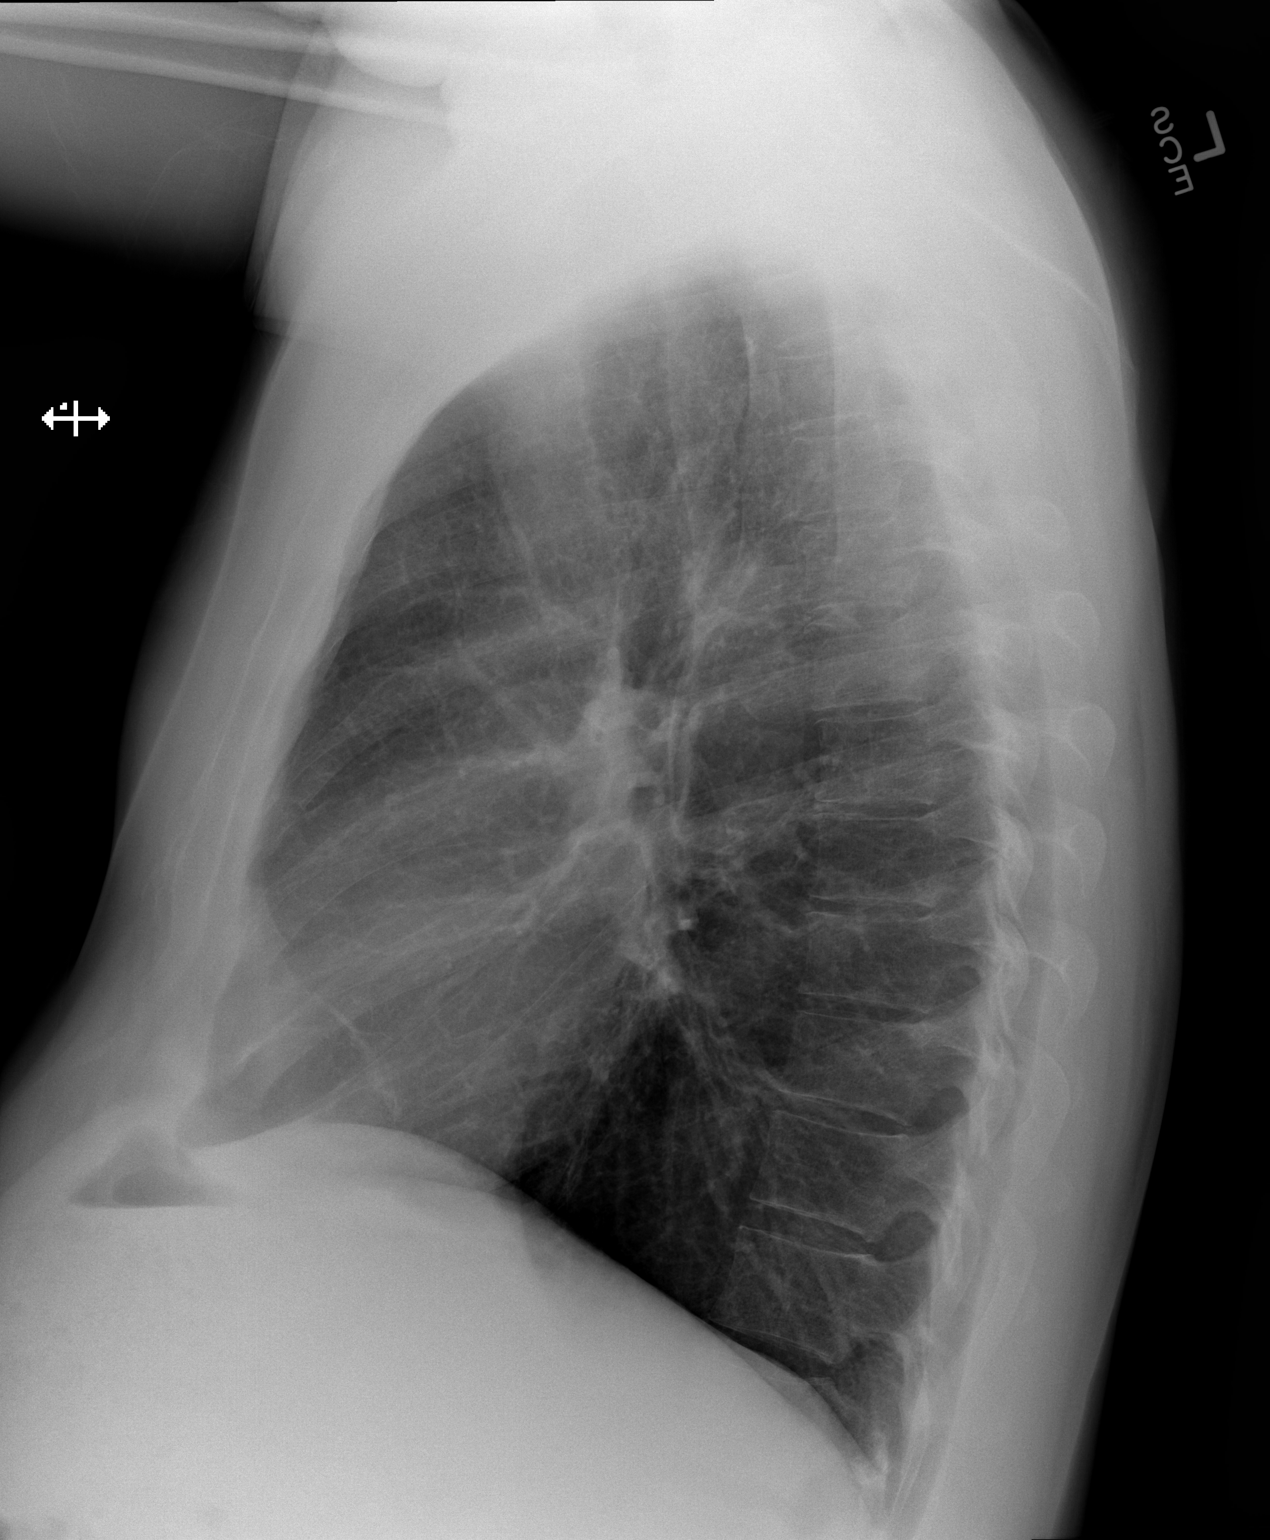

[2 of 2 positions shown; findings below may reference images not displayed]

FINDINGS: Pulmonary nodule right upper lobe again noted. Other pulmonary
nodules present are best identified by prior CT. Lungs are clear of
acute infiltrates. No pleural effusion or pneumothorax. Heart size
normal. No acute bony abnormality .
IMPRESSION: Persistent right upper lung pulmonary nodule again noted. Remaining
tiny pulmonary nodules noted on prior CT of 07/13/2015 are best
identified by CT. No acute cardiopulmonary disease.

## 2017-04-28 ENCOUNTER — Other Ambulatory Visit: Payer: Self-pay | Admitting: Family Medicine

## 2017-04-28 NOTE — Telephone Encounter (Signed)
Refill sent to pharmacy for 30 days. He needs to follow-up for further refills. Please call him to let him know this and get him scheduled. Thanks.

## 2017-04-28 NOTE — Telephone Encounter (Signed)
Patient has not  Been seen since February 2018 and does not have an appointment scheduled. Would you like to refill

## 2017-04-29 NOTE — Telephone Encounter (Signed)
Left voicemail for patient that rx has been sent for aciphex and that he needs to call the office to schedule an appointment for further refills.

## 2017-05-12 DIAGNOSIS — R918 Other nonspecific abnormal finding of lung field: Secondary | ICD-10-CM | POA: Diagnosis not present

## 2017-05-12 DIAGNOSIS — A310 Pulmonary mycobacterial infection: Secondary | ICD-10-CM | POA: Diagnosis not present

## 2017-05-12 DIAGNOSIS — J8410 Pulmonary fibrosis, unspecified: Secondary | ICD-10-CM | POA: Diagnosis not present

## 2017-05-16 DIAGNOSIS — J449 Chronic obstructive pulmonary disease, unspecified: Secondary | ICD-10-CM | POA: Diagnosis not present

## 2017-05-24 IMAGING — DX DG CHEST 1V PORT
1 series · 1 of 1 positions shown · non-contrast
Comparison: August 24, 2015

CLINICAL DATA: Status post right-sided thoracotomy

EXAM:
PORTABLE CHEST 1 VIEW

[chest ap]
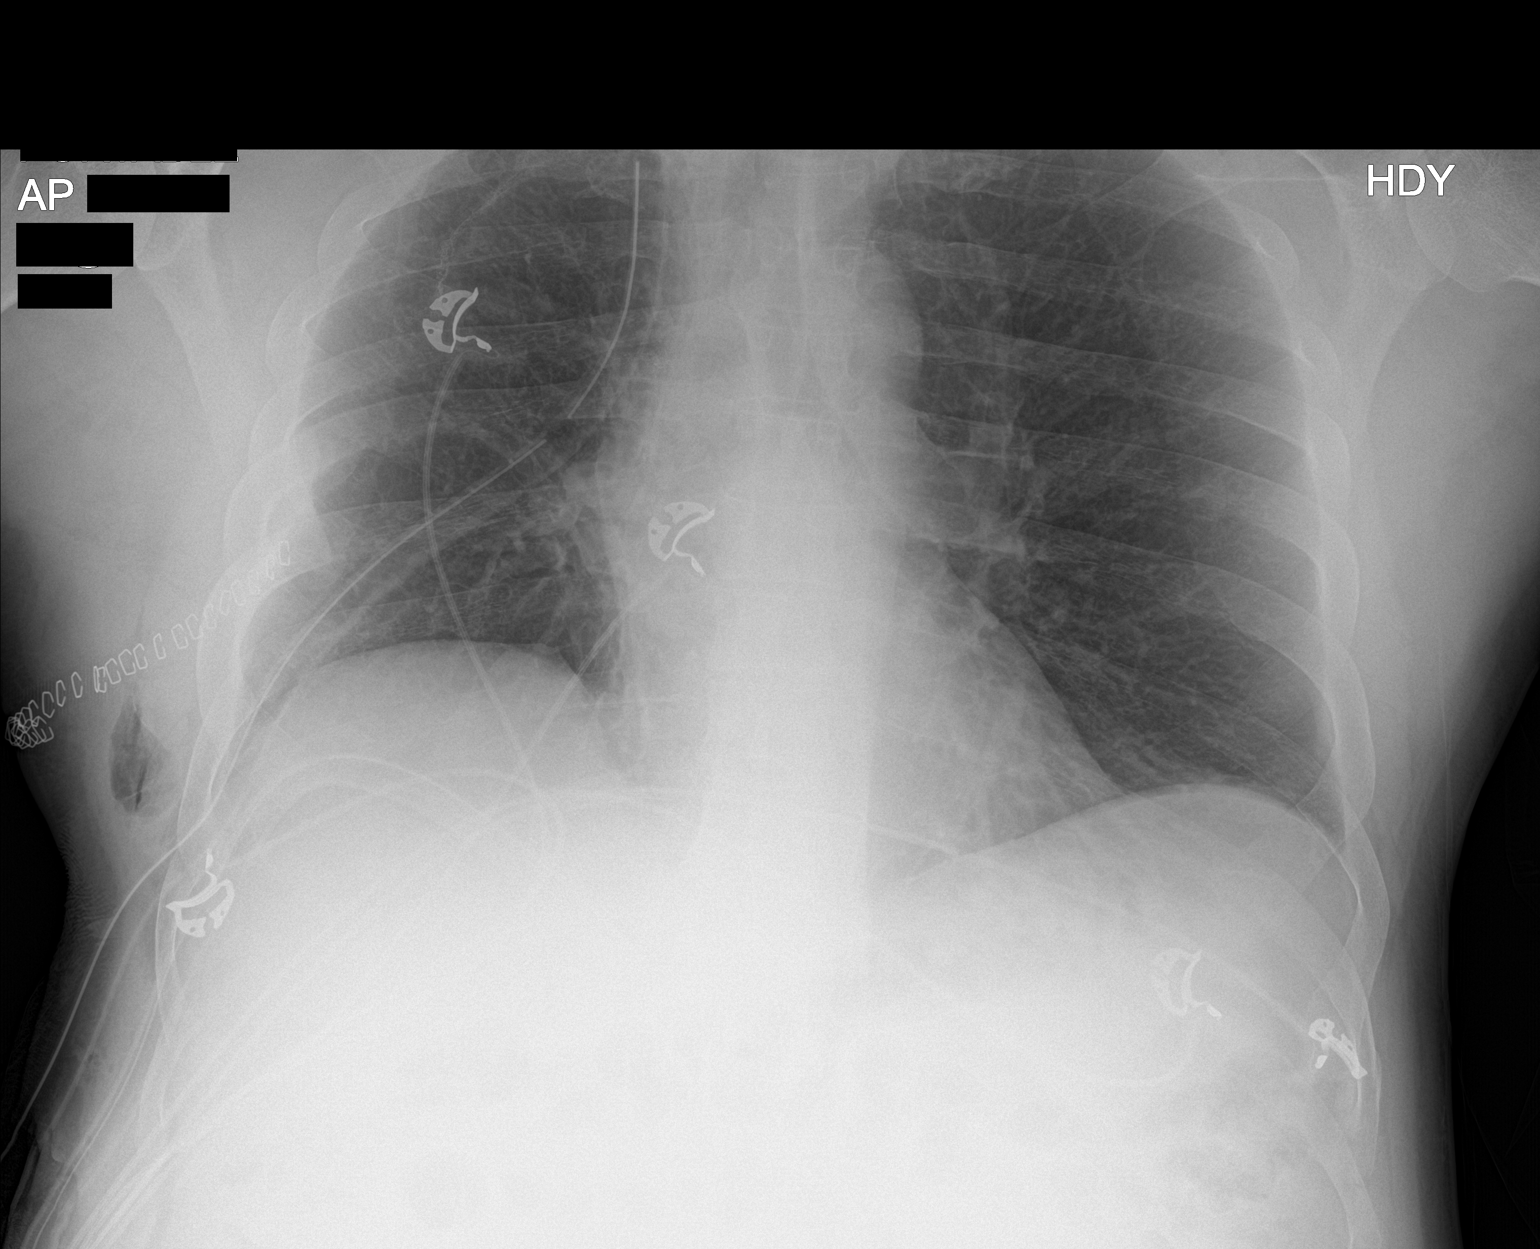

[1 of 1 positions shown; findings below may reference images not displayed]

FINDINGS: Chest tube remains in the right without appreciable pneumothorax.
There is scarring with atelectatic change on the right, stable. Soft
tissue air is noted on the right laterally and inferiorly. Skin
staples are noted in this area. Left lung is clear. Heart size and
pulmonary vascularity are normal. No adenopathy. No bone lesions.
IMPRESSION: Postoperative change on the right with stable scarring and volume
loss. No pneumothorax. Stable soft tissue air on the right
laterally. Left lung now clear. Stable cardiac silhouette.

## 2017-05-30 IMAGING — CR DG CHEST 2V
1 series · 2 of 2 positions shown · non-contrast
Comparison: 08/27/2015

CLINICAL DATA: Follow-up right pneumothorax

EXAM:
CHEST  2 VIEW

[Series 1: dg chest 2 view · 0.14mm/px · 2 of 2 slices shown]
[im 1/2]
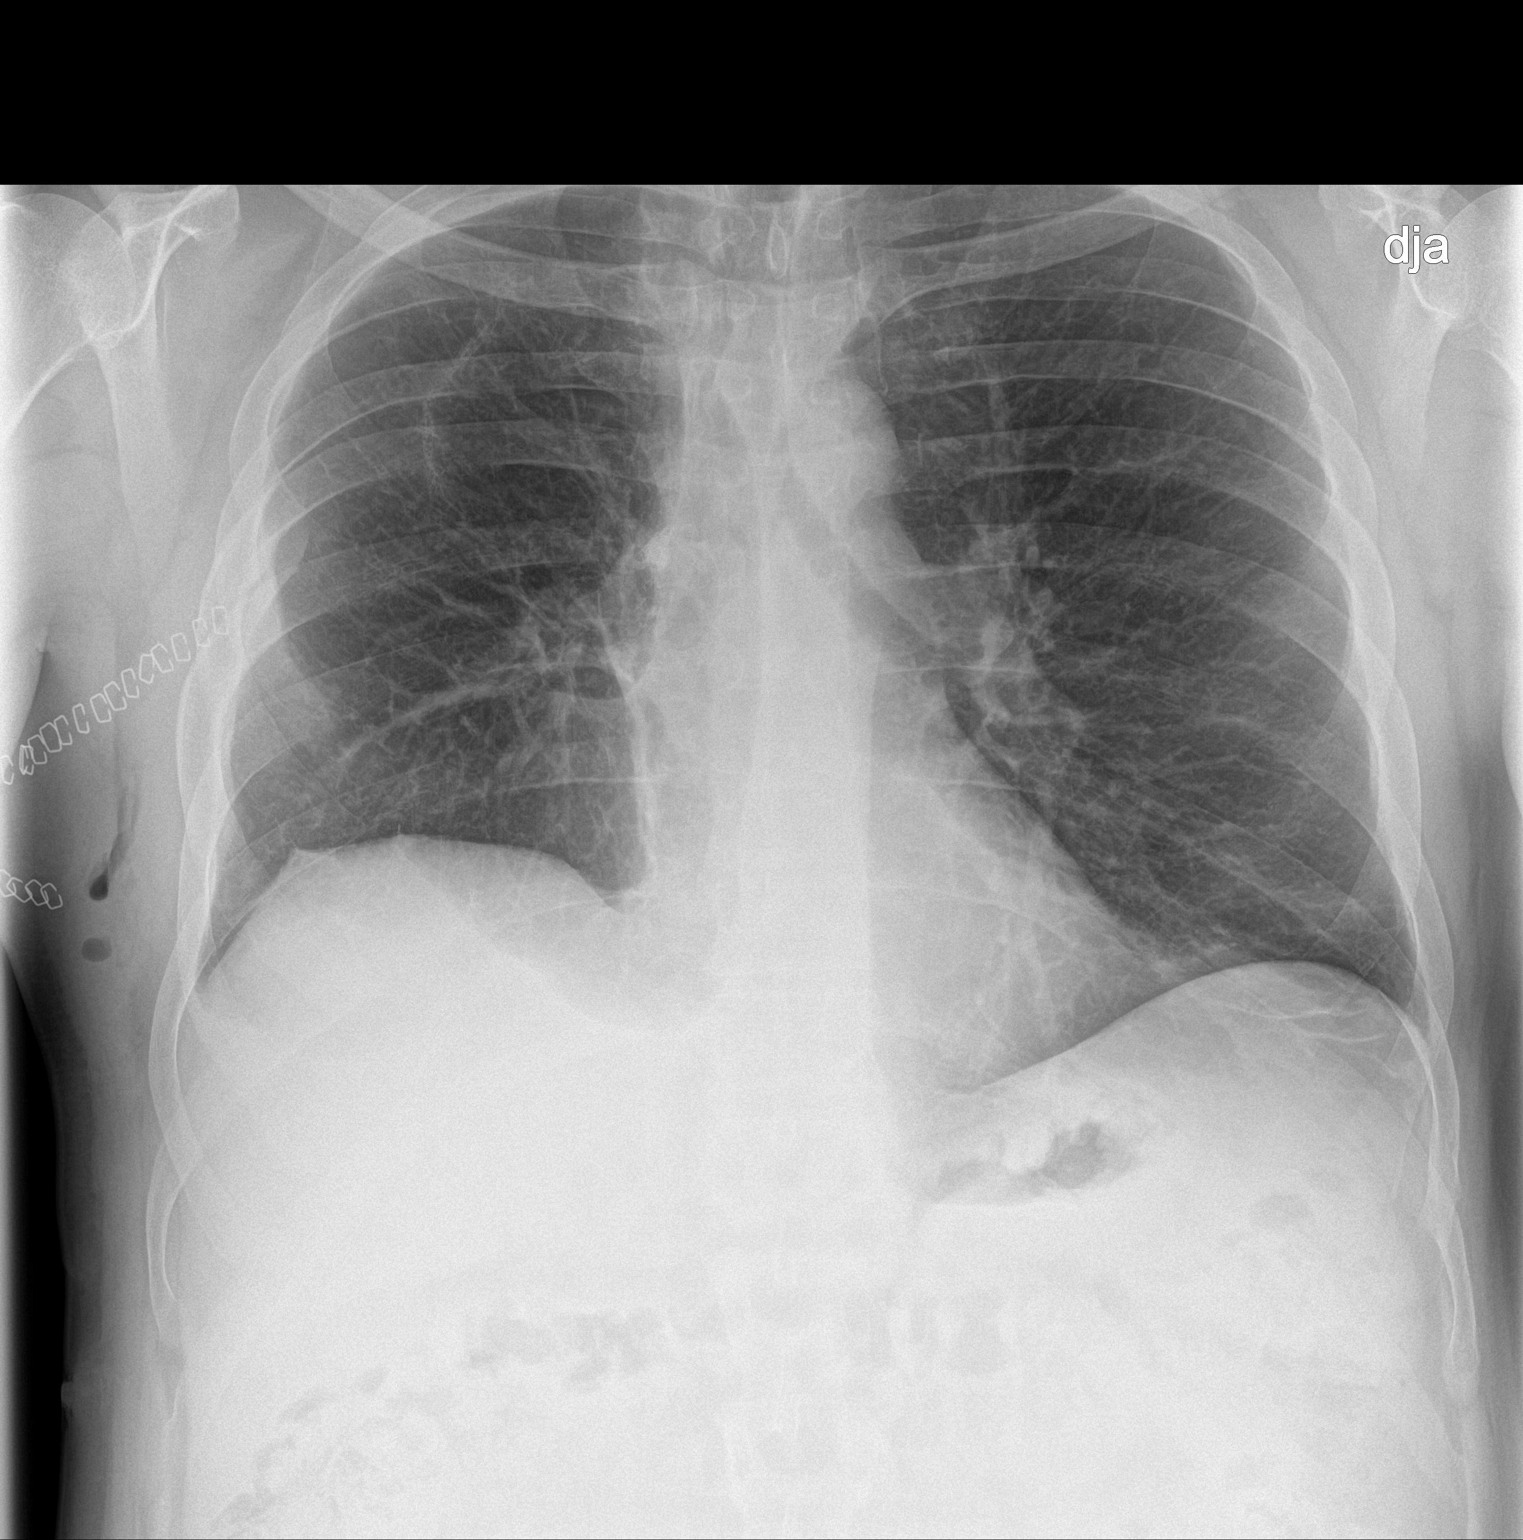
[im 2/2]
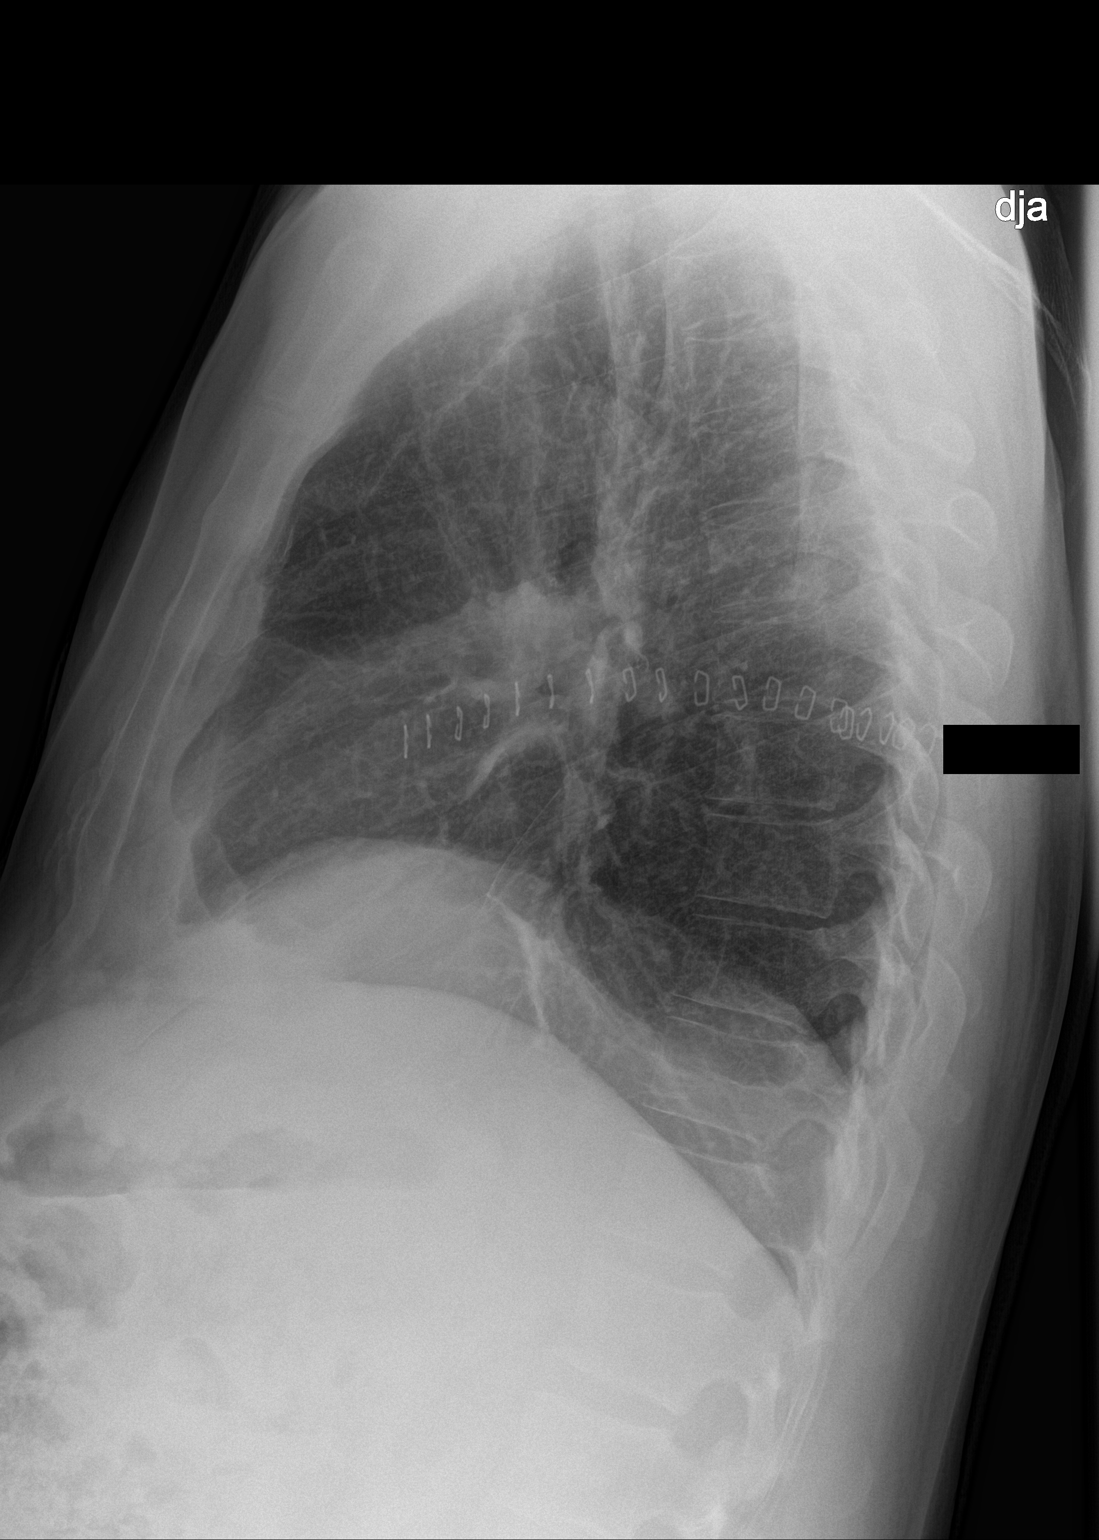

[2 of 2 positions shown; findings below may reference images not displayed]

FINDINGS: Attic shadow is stable. Postsurgical changes are again noted on the
right. No pneumothorax is identified at this time. No sizable
effusion is seen. No new infiltrate is noted.
IMPRESSION: Resolution of previously seen small right pneumothorax.

## 2017-06-15 ENCOUNTER — Other Ambulatory Visit: Payer: Self-pay | Admitting: Internal Medicine

## 2017-06-15 NOTE — Telephone Encounter (Signed)
Rx submitted

## 2017-06-16 DIAGNOSIS — J449 Chronic obstructive pulmonary disease, unspecified: Secondary | ICD-10-CM | POA: Diagnosis not present

## 2017-06-17 ENCOUNTER — Ambulatory Visit: Payer: BLUE CROSS/BLUE SHIELD | Admitting: Family Medicine

## 2017-06-17 ENCOUNTER — Encounter: Payer: Self-pay | Admitting: Family Medicine

## 2017-06-17 VITALS — BP 132/82 | HR 84 | Temp 98.0°F | Ht 69.0 in | Wt 205.2 lb

## 2017-06-17 DIAGNOSIS — K219 Gastro-esophageal reflux disease without esophagitis: Secondary | ICD-10-CM | POA: Diagnosis not present

## 2017-06-17 DIAGNOSIS — E669 Obesity, unspecified: Secondary | ICD-10-CM

## 2017-06-17 DIAGNOSIS — J449 Chronic obstructive pulmonary disease, unspecified: Secondary | ICD-10-CM

## 2017-06-17 DIAGNOSIS — K760 Fatty (change of) liver, not elsewhere classified: Secondary | ICD-10-CM | POA: Diagnosis not present

## 2017-06-17 DIAGNOSIS — A310 Pulmonary mycobacterial infection: Secondary | ICD-10-CM | POA: Diagnosis not present

## 2017-06-17 DIAGNOSIS — R918 Other nonspecific abnormal finding of lung field: Secondary | ICD-10-CM | POA: Diagnosis not present

## 2017-06-17 MED ORDER — RABEPRAZOLE SODIUM 20 MG PO TBEC: 20.0000 mg | DELAYED_RELEASE_TABLET | Freq: Every day | ORAL | 1 refills | Status: DC

## 2017-06-17 NOTE — Assessment & Plan Note (Addendum)
Encouraged continued diet and exercise.  Congratulated on losing 15 pounds.  Lab work today.

## 2017-06-17 NOTE — Assessment & Plan Note (Signed)
Seems to be well controlled.  He will continue AcipHex.

## 2017-06-17 NOTE — Assessment & Plan Note (Signed)
Enzymes have been normal.  We will recheck today.  Encourage diet and exercise to lose weight.

## 2017-06-17 NOTE — Patient Instructions (Signed)
Nice to see you. Please continue your reflux medication. Please continue to see pulmonology. We will check lab work today.  Please continue to work on diet and exercise.

## 2017-06-17 NOTE — Assessment & Plan Note (Signed)
Most recent CT scan with lung nodules that were deemed benign.  No further follow-up needed.

## 2017-06-17 NOTE — Progress Notes (Signed)
  Tommi Rumps, MD Phone: [phone]  Jimmy Howell is a 47 y.o. male who presents today for f/u.  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  He was released by them in May.  He finished treatment for this.  He has had no fevers or other issues.  Obesity: He gained some weight after quitting smoking.  He is started back with exercise and monitoring his diet.  His weight has come down 15 pounds with this.     Social History   Tobacco Use  Smoking Status Former Smoker  . Packs/day: 0.50  . Types: Cigarettes  . Last attempt to quit: 07/07/2015  . Years since quitting: 1.9  Smokeless Tobacco Never Used     ROS see history of present illness  Objective  Physical Exam Vitals:   06/17/17 1440  BP: 132/82  Pulse: 84  Temp: 98 F (36.7 C)  SpO2: 97%    BP Readings from Last 3 Encounters:  06/17/17 132/82  10/20/16 (!) 140/102  02/12/16 128/88   Wt Readings from Last 3 Encounters:  06/17/17 205 lb 3.2 oz (93.1 kg)  10/20/16 210 lb (95.3 kg)  02/12/16 203 lb 9.6 oz (92.4 kg)    Physical Exam  Constitutional: No distress.  Cardiovascular: Normal rate, regular rhythm and normal heart sounds.  Pulmonary/Chest: Effort normal and breath sounds normal.  Abdominal: Soft. Bowel sounds are normal. He exhibits no distension and no mass. There is no tenderness.  Musculoskeletal: He exhibits no edema.  Neurological: He is alert.  Skin: Skin is warm and dry. He is not diaphoretic.     Assessment/Plan: Please see individual problem list.  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  Infectious disease has  released him.  He notes no fever or other symptoms.  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  He will monitor for further issues.  Acid reflux Seems to be well controlled.  He will continue AcipHex.  COPD (chronic obstructive pulmonary disease) ([place]) Well-controlled.  I will continue his current regimen.  He will see pulmonology yearly.    Lung nodules Most recent CT scan with lung nodules that were deemed benign.  No further follow-up needed.  Obesity (BMI 30.0-34.9) Encouraged continued diet and exercise.  Congratulated on losing 15 pounds.  Lab work today.  Fatty liver Enzymes have been normal.  We will recheck today.  Encourage diet and exercise to lose weight.  Orders Placed This Encounter  Procedures  . Comp Met (CMET)  . Lipid panel  . HgB A1c    Meds ordered this encounter  Medications  . RABEprazole (ACIPHEX) 20 MG tablet    Sig: Take 1 tablet (20 mg total) by mouth daily.    Dispense:  90 tablet    Refill:  1    Needs to schedule a follow-up to receive further refills.     Tommi Rumps, MD [place]

## 2017-06-17 NOTE — Assessment & Plan Note (Signed)
Well-controlled.  I will continue his current regimen.  He will see pulmonology yearly.

## 2017-06-17 NOTE — Assessment & Plan Note (Addendum)
Patient has completed treatment for this.  Infectious disease has released him.  He notes no fever or other symptoms.  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  He will monitor for further issues.

## 2017-06-18 LAB — HEMOGLOBIN A1C: Hgb A1c MFr Bld: 5.8 % (ref 4.6–6.5)

## 2017-06-18 LAB — COMPREHENSIVE METABOLIC PANEL
ALK PHOS: 59 U/L (ref 39–117)
ALT: 78 U/L — ABNORMAL HIGH (ref 0–53)
AST: 31 U/L (ref 0–37)
Albumin: 4.5 g/dL (ref 3.5–5.2)
BUN: 14 mg/dL (ref 6–23)
CO2: 27 mEq/L (ref 19–32)
Calcium: 9.7 mg/dL (ref 8.4–10.5)
Chloride: 104 mEq/L (ref 96–112)
Creatinine, Ser: 0.89 mg/dL (ref 0.40–1.50)
GFR: 97.22 mL/min (ref >60.00)
GLUCOSE: 81 mg/dL (ref 70–99)
POTASSIUM: 4.1 meq/L (ref 3.5–5.1)
Sodium: 140 mEq/L (ref 135–145)
TOTAL PROTEIN: 7.1 g/dL (ref 6.0–8.3)
Total Bilirubin: 0.5 mg/dL (ref 0.2–1.2)

## 2017-06-18 LAB — LIPID PANEL
CHOLESTEROL: 191 mg/dL (ref 0–200)
HDL: 46.3 mg/dL (ref >39.00)
LDL Cholesterol: 113 mg/dL — ABNORMAL HIGH (ref 0–99)
NONHDL: 144.73
Total CHOL/HDL Ratio: 4
Triglycerides: 157 mg/dL — ABNORMAL HIGH (ref 0.0–149.0)
VLDL: 31.4 mg/dL (ref 0.0–40.0)

## 2017-06-19 ENCOUNTER — Other Ambulatory Visit: Payer: Self-pay

## 2017-06-19 DIAGNOSIS — R7989 Other specified abnormal findings of blood chemistry: Secondary | ICD-10-CM

## 2017-06-19 DIAGNOSIS — R945 Abnormal results of liver function studies: Principal | ICD-10-CM

## 2017-07-10 ENCOUNTER — Other Ambulatory Visit (INDEPENDENT_AMBULATORY_CARE_PROVIDER_SITE_OTHER): Payer: BLUE CROSS/BLUE SHIELD

## 2017-07-10 DIAGNOSIS — R7989 Other specified abnormal findings of blood chemistry: Secondary | ICD-10-CM

## 2017-07-10 DIAGNOSIS — R945 Abnormal results of liver function studies: Secondary | ICD-10-CM

## 2017-07-10 LAB — HEPATIC FUNCTION PANEL
ALT: 63 U/L — AB (ref 0–53)
AST: 23 U/L (ref 0–37)
Albumin: 4.4 g/dL (ref 3.5–5.2)
Alkaline Phosphatase: 56 U/L (ref 39–117)
Bilirubin, Direct: 0.1 mg/dL (ref 0.0–0.3)
Total Bilirubin: 0.6 mg/dL (ref 0.2–1.2)
Total Protein: 6.4 g/dL (ref 6.0–8.3)

## 2017-07-14 ENCOUNTER — Other Ambulatory Visit: Payer: Self-pay | Admitting: Family Medicine

## 2017-07-14 DIAGNOSIS — R7989 Other specified abnormal findings of blood chemistry: Secondary | ICD-10-CM

## 2017-07-14 DIAGNOSIS — R945 Abnormal results of liver function studies: Principal | ICD-10-CM

## 2017-07-14 NOTE — Progress Notes (Signed)
Gi

## 2017-07-16 DIAGNOSIS — J449 Chronic obstructive pulmonary disease, unspecified: Secondary | ICD-10-CM | POA: Diagnosis not present

## 2017-08-16 DIAGNOSIS — J449 Chronic obstructive pulmonary disease, unspecified: Secondary | ICD-10-CM | POA: Diagnosis not present

## 2017-08-24 ENCOUNTER — Other Ambulatory Visit
Admission: RE | Admit: 2017-08-24 | Discharge: 2017-08-24 | Disposition: A | Discharge status: Home / Self Care | Payer: BLUE CROSS/BLUE SHIELD | Source: Ambulatory Visit | Attending: Gastroenterology | Admitting: Gastroenterology

## 2017-08-24 ENCOUNTER — Other Ambulatory Visit: Payer: Self-pay

## 2017-08-24 ENCOUNTER — Encounter: Payer: Self-pay | Admitting: Gastroenterology

## 2017-08-24 ENCOUNTER — Ambulatory Visit: Payer: BLUE CROSS/BLUE SHIELD | Admitting: Gastroenterology

## 2017-08-24 VITALS — BP 136/86 | HR 86 | Ht 69.0 in | Wt 209.0 lb

## 2017-08-24 DIAGNOSIS — R748 Abnormal levels of other serum enzymes: Secondary | ICD-10-CM | POA: Diagnosis not present

## 2017-08-24 DIAGNOSIS — R945 Abnormal results of liver function studies: Secondary | ICD-10-CM | POA: Insufficient documentation

## 2017-08-24 DIAGNOSIS — F1010 Alcohol abuse, uncomplicated: Secondary | ICD-10-CM | POA: Diagnosis not present

## 2017-08-24 DIAGNOSIS — R7989 Other specified abnormal findings of blood chemistry: Secondary | ICD-10-CM

## 2017-08-24 LAB — IRON AND TIBC
IRON: 122 ug/dL (ref 45–182)
SATURATION RATIOS: 39 % (ref 17.9–39.5)
TIBC: 314 ug/dL (ref 250–450)
UIBC: 192 ug/dL

## 2017-08-24 LAB — PROTIME-INR
INR: 1
Prothrombin Time: 13.1 seconds (ref 11.4–15.2)

## 2017-08-24 LAB — FERRITIN: Ferritin: 48 ng/mL (ref 24–336)

## 2017-08-24 NOTE — Patient Instructions (Addendum)
You are scheduled for a RUQ abdominal US at [facility] on Monday, August 26th at 8:30am. Please arrive at the medical mall registration desk at 8:15am. You cannot have anything to eat or drink after midnight on Sunday night.   If you need to reschedule this appointment for any reason, please contact central scheduling at [phone].

## 2017-08-24 NOTE — Addendum Note (Signed)
Addended byRayann Heman: Trenna Kiely E on: 08/24/2017 01:48 PM   Modules accepted: Orders

## 2017-08-24 NOTE — Progress Notes (Signed)
Jonathon Bellows MD, MRCP(U.K) [address]  [place]  [place], [place] [address]  Main: [phone]  Fax: [phone]   Gastroenterology Consultation  Referring Provider:     Leone Haven, MD Primary Care Physician:  Leone Haven, MD Primary Gastroenterologist:  Dr. Jonathon Bellows  Reason for Consultation:     Abnormal LFT's        HPI:   Jimmy Howell is a 47 y.o. y/o male referred for consultation & management  by Dr. Caryl Bis, Angela Adam, MD.     First noted abnormality in LFT's in 06/2017  .  Alcohol use drinks every Friday - 10-12 beers. When he was younger drank every day , cut back 2 years back when he quit smoking cigarretts   Drug use one occasion when he was 47 or 17 tried cocaine  Over the counter herbal supplements none  New medications none  Abdominal pain no  Tattoos no  Military service no  Prior blood transfusion no  Incarceration no  History of travel [place]  Family history of liver disease no  Recent change in weight : 50 lbs gained.   CT Chest in 04/2017 showed hepatic steatosis.   Hepatic Function Latest Ref Rng & Units 07/10/2017 06/17/2017 07/27/2015  Total Protein 6.0 - 8.3 g/dL 6.4 7.1 7.4  Albumin 3.5 - 5.2 g/dL 4.4 4.5 4.6  AST 0 - 37 U/L _0 ALT 0 - 53 U/L 63(H) 78(H) 51  Alk Phosphatase 39 - 117 U/L 56 59 56  Total Bilirubin 0.2 - 1.2 mg/dL 0.6 0.5 1.4(H)  Bilirubin, Direct 0.0 - 0.3 mg/dL 0.1 - -      Past Medical History:  Diagnosis Date  . Asthma   . Chickenpox   . COPD (chronic obstructive pulmonary disease) ([place])   . GERD (gastroesophageal reflux disease)   . Pneumothorax, traumatic   . Restless leg syndrome   . Shortness of breath dyspnea    with exertion    Past Surgical History:  Procedure Laterality Date  . FINGER SURGERY    . HERNIA REPAIR     Inguinal Hernia Repair  . VIDEO ASSISTED THORACOSCOPY (VATS)/THOROCOTOMY Right 08/20/2015   Procedure: PREOP BRONCH, THORACOTOMY, RIGHT UPPER LOBECTOMY;   Surgeon: Nestor Lewandowsky, MD;  Location: [facility] ORS;  Service: General;  Laterality: Right;    Prior to Admission medications   Medication Sig Start Date End Date Taking? Authorizing Provider  RABEprazole (ACIPHEX) 20 MG tablet Take 1 tablet (20 mg total) by mouth daily. 06/17/17   Leone Haven, MD  umeclidinium-vilanterol (ANORO ELLIPTA) 62.5-25 MCG/INH AEPB Inhale 1 puff into the lungs daily. 04/20/17   Flora Lipps, MD  VENTOLIN HFA 108 (90 Base) MCG/ACT inhaler INHALE 2 PUFFS INTO THE LUNGS EVERY 4 HOURS AS NEEDED FOR WHEEZING OR SHORTNESS OF BREATH. 06/15/17   Flora Lipps, MD    Family History  Problem Relation Age of Onset  . Bladder Cancer Father   . Cancer Paternal Aunt        stomach  . Arthritis Unknown        Parent  . Hyperlipidemia Unknown        Parent  . Heart disease Unknown        Parent  . Hypertension Unknown        Parent  . Kidney disease Unknown        Parent     Social History   Tobacco Use  . Smoking status: Former  Smoker    Packs/day: 0.50    Types: Cigarettes    Last attempt to quit: 07/07/2015    Years since quitting: 2.1  . Smokeless tobacco: Never Used  Substance Use Topics  . Alcohol use: Yes    Alcohol/week: 0.0 standard drinks    Comment: 1 Case of Beer / Weekly  . Drug use: No    Allergies as of 08/24/2017 - Review Complete 06/17/2017  Allergen Reaction Noted  . Chantix [varenicline] Nausea Only 02/19/2015    Review of Systems:    All systems reviewed and negative except where noted in HPI.   Physical Exam:  There were no vitals taken for this visit. No LMP for male patient. Psych:  Alert and cooperative. Normal mood and affect. General:   Alert,  Well-developed, well-nourished, pleasant and cooperative in NAD Head:  Normocephalic and atraumatic. Eyes:  Sclera clear, no icterus.   Conjunctiva pink. Ears:  Normal auditory acuity. Nose:  No deformity, discharge, or lesions. Mouth:  No deformity or lesions,oropharynx pink &  moist. Neck:  Supple; no masses or thyromegaly. Lungs:  Respirations even and unlabored.  Clear throughout to auscultation.   No wheezes, crackles, or rhonchi. No acute distress. Heart:  Regular rate and rhythm; no murmurs, clicks, rubs, or gallops. Abdomen:  Normal bowel sounds.  No bruits.  Soft, non-tender and non-distended without masses, hepatosplenomegaly or hernias noted.  No guarding or rebound tenderness.    Extremities:  No clubbing or edema.  No cyanosis. Neurologic:  Alert and oriented x3;  grossly normal neurologically. Skin:  Spider neavi on the chest  Lymph Nodes:  No significant cervical adenopathy. Psych:  Alert and cooperative. Normal mood and affect.  Imaging Studies: No results found.  Assessment and Plan:   Jimmy Howell is a 47 y.o. y/o male has been referred for  elevated liver function tests most likely secondary to fatty liver disease .   Further labs necessary to look for viral hepatitis, autoimmune liver disease, Primary Biliary cirrhosis, celiac disease, muscle disorders,Primary Sclerosing Cholangitis, Hemachromatosis, Wilson's disease, or A-1 antitrypsin deficiency.Will rule out these causes with lab tests. Strongly advised to quit alcohol, lose weight. Obtain RUQ USG    Follow up in 6 weeks   Dr Jonathon Bellows MD,MRCP(U.K)

## 2017-08-25 LAB — ALPHA-1-ANTITRYPSIN: A-1 Antitrypsin, Ser: 119 mg/dL (ref 90–200)

## 2017-08-25 LAB — ANTI-SMOOTH MUSCLE ANTIBODY, IGG: F-ACTIN AB IGG: 9 U (ref 0–19)

## 2017-08-25 LAB — HEPATITIS B SURFACE ANTIBODY,QUALITATIVE: HEP B S AB: NONREACTIVE

## 2017-08-25 LAB — ANA: Anti Nuclear Antibody(ANA): NEGATIVE

## 2017-08-25 LAB — HEPATITIS C ANTIBODY

## 2017-08-25 LAB — HEPATITIS B SURFACE ANTIGEN: Hepatitis B Surface Ag: NEGATIVE

## 2017-08-25 LAB — HEPATITIS A ANTIBODY, TOTAL: Hep A Total Ab: NEGATIVE

## 2017-08-25 LAB — CERULOPLASMIN: Ceruloplasmin: 20 mg/dL (ref 16.0–31.0)

## 2017-08-25 LAB — MITOCHONDRIAL ANTIBODIES

## 2017-08-30 ENCOUNTER — Encounter: Payer: Self-pay | Admitting: Gastroenterology

## 2017-08-31 ENCOUNTER — Ambulatory Visit
Admission: RE | Admit: 2017-08-31 | Discharge: 2017-08-31 | Disposition: A | Discharge status: Home / Self Care | Payer: BLUE CROSS/BLUE SHIELD | Source: Ambulatory Visit | Attending: Gastroenterology | Admitting: Gastroenterology

## 2017-08-31 DIAGNOSIS — R748 Abnormal levels of other serum enzymes: Secondary | ICD-10-CM | POA: Insufficient documentation

## 2017-09-03 ENCOUNTER — Telehealth: Payer: Self-pay

## 2017-09-03 NOTE — Telephone Encounter (Signed)
-----   Message from Wyline MoodKiran Anna, MD sent at 09/01/2017 11:11 AM EDT ----- Jimmy MeekJadijah inform ultrasound of the liver shows only fatty liver.

## 2017-09-03 NOTE — Telephone Encounter (Signed)
Pt has been notified of US results  

## 2017-09-16 DIAGNOSIS — J449 Chronic obstructive pulmonary disease, unspecified: Secondary | ICD-10-CM | POA: Diagnosis not present

## 2017-10-06 ENCOUNTER — Encounter: Payer: Self-pay | Admitting: Gastroenterology

## 2017-10-06 ENCOUNTER — Ambulatory Visit: Payer: BLUE CROSS/BLUE SHIELD | Admitting: Gastroenterology

## 2017-10-06 ENCOUNTER — Encounter (INDEPENDENT_AMBULATORY_CARE_PROVIDER_SITE_OTHER): Payer: Self-pay

## 2017-10-06 VITALS — BP 117/80 | HR 69 | Ht 69.0 in | Wt 206.2 lb

## 2017-10-06 DIAGNOSIS — Z23 Encounter for immunization: Secondary | ICD-10-CM | POA: Diagnosis not present

## 2017-10-06 DIAGNOSIS — K760 Fatty (change of) liver, not elsewhere classified: Secondary | ICD-10-CM | POA: Diagnosis not present

## 2017-10-06 NOTE — Patient Instructions (Signed)
Fatty Liver Fatty liver, also called hepatic steatosis or steatohepatitis, is a condition in which too much fat has built up in your liver cells. The liver removes harmful substances from your bloodstream. It produces fluids your body needs. It also helps your body use and store energy from the food you eat. In many cases, fatty liver does not cause symptoms or problems. It is often diagnosed when tests are being done for other reasons. However, over time, fatty liver can cause inflammation that may lead to more serious liver problems, such as scarring of the liver (cirrhosis). What are the causes? Causes of fatty liver may include:  Drinking too much alcohol.  Poor nutrition.  Obesity.  Cushing syndrome.  Diabetes.  Hyperlipidemia.  Pregnancy.  Certain drugs.  Poisons.  Some viral infections.  What increases the risk? You may be more likely to develop fatty liver if you:  Abuse alcohol.  Are pregnant.  Are overweight.  Have diabetes.  Have hepatitis.  Have a high triglyceride level.  What are the signs or symptoms? Fatty liver often does not cause any symptoms. In cases where symptoms develop, they can include:  Fatigue.  Weakness.  Weight loss.  Confusion.  Abdominal pain.  Yellowing of your skin and the white parts of your eyes (jaundice).  Nausea and vomiting.  How is this diagnosed? Fatty liver may be diagnosed by:  Physical exam and medical history.  Blood tests.  Imaging tests, such as an ultrasound, CT scan, or MRI.  Liver biopsy. A small sample of liver tissue is removed using a needle. The sample is then looked at under a microscope.  How is this treated? Fatty liver is often caused by other health conditions. Treatment for fatty liver may involve medicines and lifestyle changes to manage conditions such as:  Alcoholism.  High cholesterol.  Diabetes.  Being overweight or obese.  Follow these instructions at home:  Eat a  healthy diet as directed by your health care provider.  Exercise regularly. This can help you lose weight and control your cholesterol and diabetes. Talk to your health care provider about an exercise plan and which activities are best for you.  Do not drink alcohol.  Take medicines only as directed by your health care provider. Contact a health care provider if: You have difficulty controlling your:  Blood sugar.  Cholesterol.  Alcohol consumption.  Get help right away if:  You have abdominal pain.  You have jaundice.  You have nausea and vomiting. This information is not intended to replace advice given to you by your health care provider. Make sure you discuss any questions you have with your health care provider. Document Released: 02/07/2005 Document Revised: 05/31/2015 Document Reviewed: 05/04/2013 Elsevier Interactive Patient Education  2018 Elsevier Inc.  

## 2017-10-06 NOTE — Progress Notes (Signed)
Jonathon Bellows MD, MRCP(U.K) [address]  [place]  [place], [place] [address]  Main: [phone]  Fax: [phone]   Primary Care Physician: Leone Haven, MD  Primary Gastroenterologist:  Dr. Jonathon Bellows   Chief Complaint  Patient presents with  . Follow-up    Abnormal LFTs    HPI: Jimmy Howell is a 47 y.o. male   Summary of history :   He was initially referred and seen on 08/24/17 for abnormal LFT's. Consumes large qty of alcohol every Friday , quit smoking cigarettes, tried cocaine many years back. No OTC meds. No tattoos. Gained 50 lbs recently. First noted elevated LFT's in 06/2017 .CT Chest in 04/2017 showed hepatic steatosis.    Interval history   08/24/2017-  10/06/2017   08/31/17: RUQ USG: Hepatic steatosis   Hepatic Function Latest Ref Rng & Units 07/10/2017 06/17/2017 07/27/2015  Total Protein 6.0 - 8.3 g/dL 6.4 7.1 7.4  Albumin 3.5 - 5.2 g/dL 4.4 4.5 4.6  AST 0 - 37 U/L '23 31 28  ' ALT 0 - 53 U/L 63(H) 78(H) 51  Alk Phosphatase 39 - 117 U/L 56 59 56  Total Bilirubin 0.2 - 1.2 mg/dL 0.6 0.5 1.4(H)  Bilirubin, Direct 0.0 - 0.3 mg/dL 0.1 - -     08/24/2017:  INR, ANA, alpha-1 antitrypsin levels, HCV antibody, actin, HBsAb, HBS Ag, AMA, iron studies negative.  Hep A total antibody negative. Quit smoking 2 years back.     Current Outpatient Medications  Medication Sig Dispense Refill  . RABEprazole (ACIPHEX) 20 MG tablet Take 1 tablet (20 mg total) by mouth daily. 90 tablet 1  . umeclidinium-vilanterol (ANORO ELLIPTA) 62.5-25 MCG/INH AEPB Inhale 1 puff into the lungs daily. 60 each 5  . VENTOLIN HFA 108 (90 Base) MCG/ACT inhaler INHALE 2 PUFFS INTO THE LUNGS EVERY 4 HOURS AS NEEDED FOR WHEEZING OR SHORTNESS OF BREATH. 18 g 3   No current facility-administered medications for this visit.     Allergies as of 10/06/2017 - Review Complete 10/06/2017  Allergen Reaction Noted  . Chantix [varenicline] Nausea Only 02/19/2015    ROS:  General: Negative  for anorexia, weight loss, fever, chills, fatigue, weakness. ENT: Negative for hoarseness, difficulty swallowing , nasal congestion. CV: Negative for chest pain, angina, palpitations, dyspnea on exertion, peripheral edema.  Respiratory: Negative for dyspnea at rest, dyspnea on exertion, cough, sputum, wheezing.  GI: See history of present illness. GU:  Negative for dysuria, hematuria, urinary incontinence, urinary frequency, nocturnal urination.  Endo: Negative for unusual weight change.    Physical Examination:   BP 117/80   Pulse 69   Ht '5\' 9"'  (1.753 m)   Wt 206 lb 3.2 oz (93.5 kg)   BMI 30.45 kg/m   General: Well-nourished, well-developed in no acute distress.  Eyes: No icterus. Conjunctivae pink. Mouth: Oropharyngeal mucosa moist and pink , no lesions erythema or exudate. Lungs: Clear to auscultation bilaterally. Non-labored. Heart: Regular rate and rhythm, no murmurs rubs or gallops.  Abdomen: Bowel sounds are normal, nontender, nondistended, no hepatosplenomegaly or masses, no abdominal bruits or hernia , no rebound or guarding.   Extremities: No lower extremity edema. No clubbing or deformities. Neuro: Alert and oriented x 3.  Grossly intact. Skin: Warm and dry, no jaundice.   Psych: Alert and cooperative, normal mood and affect.   Imaging Studies: No results found.  Assessment and Plan:   Jimmy Howell is a 47 y.o. y/o male here to follow up for abnormal  LFT's likely secondary to alcoholic fatty liver disease  Plan  1. Needs Hep A/B  vaccine  2. Stop all alcohol intake , weight loss, exercise, monitor lipids and for HTN 3. Low carbohydrate and low saturated fats diet.  4. Patient information on fatty liver disease.   Dr Jonathon Bellows  MD,MRCP [facility]) Follow up in 1 year

## 2017-10-15 ENCOUNTER — Other Ambulatory Visit: Payer: Self-pay | Admitting: Internal Medicine

## 2017-10-27 ENCOUNTER — Ambulatory Visit: Payer: BLUE CROSS/BLUE SHIELD | Admitting: Internal Medicine

## 2017-10-27 ENCOUNTER — Encounter: Payer: Self-pay | Admitting: Internal Medicine

## 2017-10-27 VITALS — BP 140/80 | HR 73 | Ht 68.0 in | Wt 204.6 lb

## 2017-10-27 DIAGNOSIS — J449 Chronic obstructive pulmonary disease, unspecified: Secondary | ICD-10-CM | POA: Diagnosis not present

## 2017-10-27 DIAGNOSIS — Z23 Encounter for immunization: Secondary | ICD-10-CM | POA: Diagnosis not present

## 2017-10-27 NOTE — Patient Instructions (Signed)
Continue inhalers as prescribed Flu shot given today

## 2017-10-27 NOTE — Progress Notes (Signed)
[facility] Pulmonary Medicine Consultation      Date: 10/27/2017,   MRN# [id number] Jimmy Howell May 10, 1970     Admission                  Current  Jimmy Howell is a 47 y.o. old male seen in consultation for COPD and lung nodules at the request of Dr. 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  ANORO ELLIPTA 62.5-25 MCG/INH AEPB, INHALE 1 PUFF INTO THE LUNGS DAILY., Disp: 60 each, Rfl: 5 .  RABEprazole (ACIPHEX) 20 MG tablet, Take 1 tablet (20 mg total) by mouth daily., Disp: 90 tablet, Rfl: 1 .  VENTOLIN HFA 108 (90 Base) MCG/ACT inhaler, INHALE 2 PUFFS INTO THE LUNGS EVERY 4 HOURS AS NEEDED FOR WHEEZING OR SHORTNESS OF BREATH., Disp: 18 g, Rfl: 3    ALLERGIES   Chantix [varenicline]   BP 140/80 (BP Location: Left Arm, Cuff Size: Normal)   Pulse 73   Ht 5\' 8"  (1.727 m)   Wt 204 lb 9.6 oz (92.8 kg)   SpO2 97%   BMI 31.11 kg/m    REVIEW OF SYSTEMS   Review of Systems  Constitutional: Negative for chills, fever, malaise/fatigue and weight loss.  HENT: Negative for congestion.   Respiratory: Positive for shortness of breath. Negative for cough, hemoptysis, sputum production and wheezing.   Cardiovascular: Negative for chest pain, palpitations, orthopnea and leg swelling.  Gastrointestinal: Negative for  heartburn and nausea.  Neurological: Negative for tingling.  Psychiatric/Behavioral: The patient is not nervous/anxious.   All other systems reviewed and are negative.   .vs.vs    PHYSICAL EXAM  Physical Exam  Constitutional: He is oriented to person, place, and time. No distress.  Eyes: EOM are normal.  Cardiovascular: Normal rate, regular rhythm and normal heart sounds.  No murmur heard. Pulmonary/Chest: Effort normal and breath sounds normal. No stridor. No respiratory distress. He has no wheezes. He has no rales.  Neurological: He is alert and oriented to person, place, and time.  Skin: Skin is warm. He is not diaphoretic.  Psychiatric: He has a normal mood and affect.    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 All questions answered  Corrin Parker, M.D.  [facility]  Medical Director [place] Director [facility] Cardio-Pulmonary Department

## 2017-11-03 ENCOUNTER — Telehealth: Payer: Self-pay

## 2017-11-03 NOTE — Telephone Encounter (Signed)
-----   Message from Isa Rankin, CMA sent at 10/06/2017  3:23 PM EDT ----- Regarding: Pt Hep A/B vaccine reminder Call and remind pt to return to office for dose #2 of Hep A/B vaccine

## 2017-11-03 NOTE — Telephone Encounter (Signed)
Spoke with pt and reminded him that he is due for his next Hep A/B vaccine dose. Pt is aware that he has a nurse visit scheduled on 11-09-17.

## 2017-11-09 ENCOUNTER — Ambulatory Visit (INDEPENDENT_AMBULATORY_CARE_PROVIDER_SITE_OTHER): Payer: BLUE CROSS/BLUE SHIELD | Admitting: Gastroenterology

## 2017-11-09 DIAGNOSIS — K760 Fatty (change of) liver, not elsewhere classified: Secondary | ICD-10-CM | POA: Diagnosis not present

## 2017-11-09 DIAGNOSIS — Z23 Encounter for immunization: Secondary | ICD-10-CM

## 2017-11-09 NOTE — Progress Notes (Signed)
Pt is in for a nurse visit to receive his second dose of the Hep A/B vaccine. Pt is aware his last dose is due in 6 months. I explained to pt that we will contact him to give a reminder.

## 2017-12-18 ENCOUNTER — Ambulatory Visit: Payer: BLUE CROSS/BLUE SHIELD | Admitting: Family Medicine

## 2017-12-18 ENCOUNTER — Encounter: Payer: Self-pay | Admitting: Family Medicine

## 2017-12-18 VITALS — BP 106/80 | HR 71 | Temp 97.9°F | Ht 68.0 in | Wt 211.0 lb

## 2017-12-18 DIAGNOSIS — K219 Gastro-esophageal reflux disease without esophagitis: Secondary | ICD-10-CM | POA: Diagnosis not present

## 2017-12-18 DIAGNOSIS — R7303 Prediabetes: Secondary | ICD-10-CM

## 2017-12-18 DIAGNOSIS — K760 Fatty (change of) liver, not elsewhere classified: Secondary | ICD-10-CM

## 2017-12-18 DIAGNOSIS — E669 Obesity, unspecified: Secondary | ICD-10-CM

## 2017-12-18 DIAGNOSIS — I2510 Atherosclerotic heart disease of native coronary artery without angina pectoris: Secondary | ICD-10-CM

## 2017-12-18 LAB — COMPREHENSIVE METABOLIC PANEL
ALBUMIN: 4.3 g/dL (ref 3.5–5.2)
ALT: 54 U/L — ABNORMAL HIGH (ref 0–53)
AST: 25 U/L (ref 0–37)
Alkaline Phosphatase: 55 U/L (ref 39–117)
BUN: 12 mg/dL (ref 6–23)
CHLORIDE: 105 meq/L (ref 96–112)
CO2: 26 mEq/L (ref 19–32)
CREATININE: 0.74 mg/dL (ref 0.40–1.50)
Calcium: 9.1 mg/dL (ref 8.4–10.5)
GFR: 120.05 mL/min (ref >60.00)
GLUCOSE: 102 mg/dL — AB (ref 70–99)
Potassium: 3.9 mEq/L (ref 3.5–5.1)
SODIUM: 139 meq/L (ref 135–145)
TOTAL PROTEIN: 6.7 g/dL (ref 6.0–8.3)
Total Bilirubin: 0.4 mg/dL (ref 0.2–1.2)

## 2017-12-18 LAB — HEMOGLOBIN A1C: HEMOGLOBIN A1C: 5.7 % (ref 4.6–6.5)

## 2017-12-18 LAB — LDL CHOLESTEROL, DIRECT: LDL DIRECT: 101 mg/dL

## 2017-12-18 MED ORDER — ESOMEPRAZOLE MAGNESIUM 20 MG PO PACK: 20.0000 mg | PACK | Freq: Every day | ORAL | 3 refills | Status: DC

## 2017-12-18 NOTE — Assessment & Plan Note (Signed)
Seems to be well controlled.  We will change him to Nexium.  He will let us know if his symptoms worsen on this regimen.

## 2017-12-18 NOTE — Assessment & Plan Note (Signed)
Encouraged diet and exercise.  

## 2017-12-18 NOTE — Progress Notes (Signed)
  Tommi Rumps, MD Phone: [phone]  Jimmy Howell is a 47 y.o. male who presents today for follow-up.  CC: GERD, fatty liver, CAD  GERD: Patient notes has been taking Nexium from his wife recently.  This works just as well as the AcipHex.  He notes no reflux, abdominal pain, blood in stool, or dysphagia.  Fatty liver: Patient saw GI.  He underwent lab evaluation.  His fatty liver was felt to be related to alcohol use.  He has decreased his alcohol use from 18 beers a week to 6 beers a week.  He notes no right upper quadrant pain.  He is exercising with walking on weekends and is quite active at work.  He does eat fast food for lunch most days.  CAD: Noted on prior chest CT.  He notes no chest pain or shortness of breath.  He does note his brother had a heart attack a couple of months ago.  Social History   Tobacco Use  Smoking Status Former Smoker  . Packs/day: 0.50  . Types: Cigarettes  . Last attempt to quit: 07/07/2015  . Years since quitting: 2.4  Smokeless Tobacco Never Used     ROS see history of present illness  Objective  Physical Exam Vitals:   12/18/17 1314  BP: 106/80  Pulse: 71  Temp: 97.9 F (36.6 C)  SpO2: 96%    BP Readings from Last 3 Encounters:  12/18/17 106/80  10/27/17 140/80  10/06/17 117/80   Wt Readings from Last 3 Encounters:  12/18/17 211 lb (95.7 kg)  10/27/17 204 lb 9.6 oz (92.8 kg)  10/06/17 206 lb 3.2 oz (93.5 kg)    Physical Exam Constitutional:      General: He is not in acute distress.    Appearance: He is not diaphoretic.  Cardiovascular:     Rate and Rhythm: Normal rate and regular rhythm.     Heart sounds: Normal heart sounds.  Pulmonary:     Effort: Pulmonary effort is normal.     Breath sounds: Normal breath sounds.  Musculoskeletal:     Right lower leg: No edema.     Left lower leg: No edema.  Skin:    General: Skin is warm and dry.  Neurological:     Mental Status: He is alert.      Assessment/Plan:  Please see individual problem list.  Atherosclerosis of native coronary artery of native heart without angina pectoris Asymptomatic.  Noted on prior imaging.  We will recheck his LDL today and then likely place on statin therapy.  Acid reflux Seems to be well controlled.  We will change him to Nexium.  He will let us know if his symptoms worsen on this regimen.  Fatty liver Congratulated on decreased alcohol intake.  I encouraged him to decrease this further.  Encouraged him to change his diet with less fast food at lunch.  He will continue to stay active.  Obesity (BMI 30.0-34.9) Encouraged diet and exercise.   Health Maintenance: Patient reports he had a tetanus vaccination in 2017.  Orders Placed This Encounter  Procedures  . Direct LDL  . Comp Met (CMET)  . HgB A1c    Meds ordered this encounter  Medications  . esomeprazole (NEXIUM) 20 MG packet    Sig: Take 20 mg by mouth daily before breakfast.    Dispense:  30 each    Refill:  3     Tommi Rumps, MD [place]

## 2017-12-18 NOTE — Assessment & Plan Note (Signed)
Congratulated on decreased alcohol intake.  I encouraged him to decrease this further.  Encouraged him to change his diet with less fast food at lunch.  He will continue to stay active.

## 2017-12-18 NOTE — Assessment & Plan Note (Signed)
Asymptomatic.  Noted on prior imaging.  We will recheck his LDL today and then likely place on statin therapy.

## 2017-12-18 NOTE — Patient Instructions (Addendum)
Nice to see you. We will get lab work today and contact you with results. We sent in Nexium for you to take for your reflux. Please try to cut further down on your alcohol use. Please try to change several lunches a week from fast food to a packed lunch.

## 2017-12-22 ENCOUNTER — Other Ambulatory Visit: Payer: Self-pay | Admitting: Family Medicine

## 2017-12-22 DIAGNOSIS — E785 Hyperlipidemia, unspecified: Secondary | ICD-10-CM

## 2017-12-22 MED ORDER — ROSUVASTATIN CALCIUM 20 MG PO TABS: 20.0000 mg | ORAL_TABLET | Freq: Every day | ORAL | 3 refills | Status: DC

## 2017-12-22 NOTE — Progress Notes (Signed)
c 

## 2018-01-01 ENCOUNTER — Ambulatory Visit: Payer: BLUE CROSS/BLUE SHIELD | Admitting: Family

## 2018-01-01 ENCOUNTER — Encounter: Payer: Self-pay | Admitting: Family

## 2018-01-01 VITALS — BP 122/76 | HR 88 | Temp 97.8°F | Ht 68.0 in | Wt 208.6 lb

## 2018-01-01 DIAGNOSIS — J441 Chronic obstructive pulmonary disease with (acute) exacerbation: Secondary | ICD-10-CM

## 2018-01-01 MED ORDER — IPRATROPIUM-ALBUTEROL 0.5-2.5 (3) MG/3ML IN SOLN
3.0000 mL | Freq: Four times a day (QID) | RESPIRATORY_TRACT | Status: AC
  Administered 2018-01-01: 3 mL via RESPIRATORY_TRACT

## 2018-01-01 MED ORDER — DOXYCYCLINE HYCLATE 100 MG PO TABS: 100.0000 mg | ORAL_TABLET | Freq: Two times a day (BID) | ORAL | 0 refills | Status: DC

## 2018-01-01 NOTE — Progress Notes (Signed)
Subjective:    Patient ID: Jimmy Howell Martensen, male    DOB: October 11, 1970, 47 y.o.   MRN: [id number]  CC: Jimmy Howell Virgen is a 47 y.o. male who presents today for an acute visit.    HPI: Chief complaint of productive cough, congestion x3 weeks,  Worsening.   Has tried Claritin, Sudafed , Flonase  No chest pain, sob.  Endorses chest tightness, wheezing, sinus pressure, hoarseness, sore throat.    No fever.  Using ventolin 3-4 /per day past couple of days, with resolved. Using anora.   H/o asthma as child  Former smoker - quit 2 years ago,.     s/p right partial lobectomy 2017    HISTORY:  Past Medical History:  Diagnosis Date  . Asthma   . Chickenpox   . COPD (chronic obstructive pulmonary disease) (HCC)   . GERD (gastroesophageal reflux disease)   . Pneumothorax, traumatic   . Restless leg syndrome   . Shortness of breath dyspnea    with exertion  . Tobacco abuse 02/19/2015   Overview:  Last Assessment & Plan:  Quit 2 weeks ago. Patient like assistance and wants to use Wellbutrin. We'll start on Wellbutrin 150 mg daily for 3 days and then increase to 150 mg twice a day.   Past Surgical History:  Procedure Laterality Date  . FINGER SURGERY    . HERNIA REPAIR     Inguinal Hernia Repair  . VIDEO ASSISTED THORACOSCOPY (VATS)/THOROCOTOMY Right 08/20/2015   Procedure: PREOP BRONCH, THORACOTOMY, RIGHT UPPER LOBECTOMY;  Surgeon: Hulda Marinimothy Oaks, MD;  Location: [facility] ORS;  Service: General;  Laterality: Right;   Family History  Problem Relation Age of Onset  . Bladder Cancer Father   . Cancer Paternal Aunt        stomach  . Arthritis Unknown        Parent  . Hyperlipidemia Unknown        Parent  . Heart disease Unknown        Parent  . Hypertension Unknown        Parent  . Kidney disease Unknown        Parent    Allergies: Chantix [varenicline] Current Outpatient Medications on File Prior to Visit  Medication Sig Dispense Refill  . ANORO ELLIPTA 62.5-25 MCG/INH AEPB  INHALE 1 PUFF INTO THE LUNGS DAILY. 60 each 5  . esomeprazole (NEXIUM) 20 MG packet Take 20 mg by mouth daily before breakfast. 30 each 3  . rosuvastatin (CRESTOR) 20 MG tablet Take 1 tablet (20 mg total) by mouth daily. 90 tablet 3  . VENTOLIN HFA 108 (90 Base) MCG/ACT inhaler INHALE 2 PUFFS INTO THE LUNGS EVERY 4 HOURS AS NEEDED FOR WHEEZING OR SHORTNESS OF BREATH. 18 g 3   No current facility-administered medications on file prior to visit.     Social History   Tobacco Use  . Smoking status: Former Smoker    Packs/day: 0.50    Types: Cigarettes    Last attempt to quit: 07/07/2015    Years since quitting: 2.4  . Smokeless tobacco: Never Used  Substance Use Topics  . Alcohol use: Yes    Alcohol/week: 0.0 standard drinks    Comment: 1 Case of Beer / Weekly  . Drug use: No    Review of Systems  Constitutional: Negative for chills and fever.  HENT: Positive for congestion, sinus pressure and voice change. Negative for ear pain, sore throat and trouble swallowing.   Respiratory: Positive for cough and  wheezing. Negative for shortness of breath.   Cardiovascular: Negative for chest pain and palpitations.  Gastrointestinal: Negative for nausea and vomiting.      Objective:    BP 122/76   Pulse 88   Temp 97.8 F (36.6 C) (Oral)   Ht 5\' 8"  (1.727 m)   Wt 208 lb 9.6 oz (94.6 kg)   SpO2 94%   BMI 31.72 kg/m    Physical Exam Vitals signs reviewed.  Constitutional:      Appearance: He is well-developed.  HENT:     Head: Normocephalic and atraumatic.     Right Ear: Hearing, tympanic membrane, ear canal and external ear normal. No decreased hearing noted. No drainage, swelling or tenderness. No middle ear effusion. Tympanic membrane is not injected, erythematous or bulging.     Left Ear: Hearing, tympanic membrane, ear canal and external ear normal. No decreased hearing noted. No drainage, swelling or tenderness.  No middle ear effusion. Tympanic membrane is not injected,  erythematous or bulging.     Nose: Nose normal.     Right Sinus: No maxillary sinus tenderness or frontal sinus tenderness.     Left Sinus: No maxillary sinus tenderness or frontal sinus tenderness.     Mouth/Throat:     Pharynx: Uvula midline. No oropharyngeal exudate or posterior oropharyngeal erythema.     Tonsils: No tonsillar abscesses.  Eyes:     Conjunctiva/sclera: Conjunctivae normal.  Cardiovascular:     Rate and Rhythm: Regular rhythm.     Heart sounds: Normal heart sounds.  Pulmonary:     Effort: Pulmonary effort is normal. No respiratory distress.     Breath sounds: Normal breath sounds. No wheezing, rhonchi or rales.     Comments: Coarse lung sounds at BL bases.  Lymphadenopathy:     Head:     Right side of head: No submental, submandibular, tonsillar, preauricular, posterior auricular or occipital adenopathy.     Left side of head: No submental, submandibular, tonsillar, preauricular, posterior auricular or occipital adenopathy.     Cervical: No cervical adenopathy.  Skin:    General: Skin is warm and dry.  Neurological:     Mental Status: He is alert.  Psychiatric:        Speech: Speech normal.        Behavior: Behavior normal.    After albuterol treatment, lung sounds clear and increased      Assessment & Plan:   1. COPD exacerbation (HCC) Afebrile.  Patient is well-appearing, no acute respiratory distress.  Please as lung sounds are clear improved after nebulizer treatment.  Based on complex pulmonary history, duration, COPD, will go ahead and treat antibiotic therapy.  Patient will remain vigilant, let me know if not better. -doxycycline - ipratropium-albuterol (DUONEB) 0.5-2.5 (3) MG/3ML nebulizer solution 3 mL    I am having Jeffrey Howell. Kunda maintain his VENTOLIN HFA, ANORO ELLIPTA, esomeprazole, and rosuvastatin.   No orders of the defined types were placed in this encounter.   Return precautions given.   Risks, benefits, and alternatives of the  medications and treatment plan prescribed today were discussed, and patient expressed understanding.   Education regarding symptom management and diagnosis given to patient on AVS.  Continue to follow with Glori LuisSonnenberg, Eric G, MD for routine health maintenance.   Jimmy Howell Deisher and I agreed with plan.   Rennie PlowmanMargaret Randall Rampersad, FNP

## 2018-01-01 NOTE — Patient Instructions (Signed)
Please start antibiotic, doxycycline today.  Please avoid the sun while on this medication and she converted easier while on it. Ensure to take probiotics while on antibiotics and also for 2 weeks after completion. It is important to re-colonize the gut with good bacteria and also to prevent any diarrheal infections associated with antibiotic use.   Continue to use inhalers as prescribed.  Please let me know if you are not better.    Chronic Obstructive Pulmonary Disease Exacerbation  Chronic obstructive pulmonary disease (COPD) is a long-term (chronic) condition that affects the lungs. COPD is a general term that can be used to describe many different lung problems that cause lung swelling (inflammation) and limit airflow, including chronic bronchitis and emphysema. COPD exacerbations are episodes when breathing symptoms become much worse and require extra treatment. COPD exacerbations are usually caused by infections. Without treatment, COPD exacerbations can be severe and even life threatening. Frequent COPD exacerbations can cause further damage to the lungs. What are the causes? This condition may be caused by:  Respiratory infections, including viral and bacterial infections.  Exposure to smoke.  Exposure to air pollution, chemical fumes, or dust.  Things that give you an allergic reaction (allergens).  Not taking your usual COPD medicines as directed.  Underlying medical problems, such as congestive heart failure or infections not involving the lungs. In many cases, the cause (trigger) of this condition is not known. What increases the risk? The following factors may make you more likely to develop this condition:  Smoking cigarettes.  Old age.  Frequent prior COPD exacerbations. What are the signs or symptoms? Symptoms of this condition include:  Increased coughing.  Increased production of mucus from your lungs (sputum).  Increased wheezing.  Increased shortness  of breath.  Rapid or labored breathing.  Chest tightness.  Less energy than usual.  Sleep disruption from symptoms.  Confusion or increased sleepiness. Often these symptoms happen or get worse even with the use of medicines. How is this diagnosed? This condition is diagnosed based on:  Your medical history.  A physical exam. You may also have tests, including:  A chest X-ray.  Blood tests.  Lung (pulmonary) function tests. How is this treated? Treatment for this condition depends on the severity and cause of the symptoms. You may need to be admitted to a hospital for treatment. Some of the treatments commonly used to treat COPD exacerbations are:  Antibiotic medicines. These may be used for severe exacerbations caused by a lung infection, such as pneumonia.  Bronchodilators. These are inhaled medicines that expand the air passages and allow increased airflow.  Steroid medicines. These act to reduce inflammation in the airways. They may be given with an inhaler, taken by mouth, or given through an IV tube inserted into one of your veins.  Supplemental oxygen therapy.  Airway clearing techniques, such as noninvasive ventilation (NIV) and positive expiratory pressure (PEP). These provide respiratory support through a mask or other noninvasive device. An example of this would be using a continuous positive airway pressure (CPAP) machine to improve delivery of oxygen into your lungs. Follow these instructions at home: Medicines  Take over-the-counter and prescription medicines only as told by your health care provider. It is important to use correct technique with inhaled medicines.  If you were prescribed an antibiotic medicine or oral steroid, take it as told by your health care provider. Do not stop taking the medicine even if you start to feel better. Lifestyle  Eat a healthy diet.  Exercise regularly.  Get plenty of sleep.  Avoid exposure to all substances that  irritate the airway, especially to tobacco smoke.  Wash your hands often with soap and water to reduce the risk of infection. If soap and water are not available, use hand sanitizer.  During flu season, avoid enclosed spaces that are crowded with people. General instructions  Drink enough fluid to keep your urine clear or pale yellow (unless you have a medical condition that requires fluid restriction).  Use a cool mist vaporizer. This humidifies the air and makes it easier for you to clear your chest when you cough.  If you have a home nebulizer and oxygen, continue to use them as told by your health care provider.  Keep all follow-up visits as told by your health care provider. This is important. How is this prevented?  Stay up-to-date on pneumococcal and influenza (flu) vaccines. A flu shot is recommended every year to help prevent exacerbations.  Do not use any products that contain nicotine or tobacco, such as cigarettes and e-cigarettes. Quitting smoking is very important in preventing COPD from getting worse and in preventing exacerbations from happening as often. If you need help quitting, ask your health care provider.  Follow all instructions for pulmonary rehabilitation after a recent exacerbation. This can help prevent future exacerbations.  Work with your health care provider to develop and follow an action plan. This tells you what steps to take when you experience certain symptoms. Contact a health care provider if:  You have a worsening of your regular COPD symptoms. Get help right away if:  You have worsening shortness of breath, even when resting.  You have trouble talking.  You have severe chest pain.  You cough up blood.  You have a fever.  You have weakness, vomit repeatedly, or faint.  You feel confused.  You are not able to sleep because of your symptoms.  You have trouble doing daily activities. Summary  COPD exacerbations are episodes when  breathing symptoms become much worse and require extra treatment above your normal treatment.  Exacerbations can be severe and even life threatening. Frequent COPD exacerbations can cause further damage to your lungs.  COPD exacerbations are usually triggered by infections such as the flu, colds, and even pneumonia.  Treatment for this condition depends on the severity and cause of the symptoms. You may need to be admitted to a hospital for treatment.  Quitting smoking is very important to prevent COPD from getting worse and to prevent exacerbations from happening as often. This information is not intended to replace advice given to you by your health care provider. Make sure you discuss any questions you have with your health care provider. Document Released: 10/20/2006 Document Revised: 06/18/2016 Document Reviewed: 01/28/2016 Elsevier Interactive Patient Education  2019 [employer]

## 2018-01-12 ENCOUNTER — Other Ambulatory Visit: Payer: Self-pay | Admitting: Internal Medicine

## 2018-01-15 ENCOUNTER — Ambulatory Visit: Payer: BLUE CROSS/BLUE SHIELD | Admitting: Family Medicine

## 2018-01-25 ENCOUNTER — Other Ambulatory Visit: Payer: BLUE CROSS/BLUE SHIELD

## 2018-02-17 ENCOUNTER — Other Ambulatory Visit (INDEPENDENT_AMBULATORY_CARE_PROVIDER_SITE_OTHER): Payer: BLUE CROSS/BLUE SHIELD

## 2018-02-17 DIAGNOSIS — E785 Hyperlipidemia, unspecified: Secondary | ICD-10-CM | POA: Diagnosis not present

## 2018-02-17 LAB — LDL CHOLESTEROL, DIRECT: Direct LDL: 56 mg/dL

## 2018-02-17 LAB — HEPATIC FUNCTION PANEL
ALK PHOS: 59 U/L (ref 39–117)
ALT: 54 U/L — ABNORMAL HIGH (ref 0–53)
AST: 28 U/L (ref 0–37)
Albumin: 4.6 g/dL (ref 3.5–5.2)
BILIRUBIN DIRECT: 0.2 mg/dL (ref 0.0–0.3)
TOTAL PROTEIN: 7.2 g/dL (ref 6.0–8.3)
Total Bilirubin: 0.7 mg/dL (ref 0.2–1.2)

## 2018-03-25 ENCOUNTER — Other Ambulatory Visit: Payer: Self-pay | Admitting: Internal Medicine

## 2018-04-19 ENCOUNTER — Telehealth: Payer: Self-pay | Admitting: Family Medicine

## 2018-04-19 ENCOUNTER — Other Ambulatory Visit: Payer: Self-pay

## 2018-04-19 ENCOUNTER — Encounter: Payer: Self-pay | Admitting: Family Medicine

## 2018-04-19 ENCOUNTER — Ambulatory Visit (INDEPENDENT_AMBULATORY_CARE_PROVIDER_SITE_OTHER): Payer: BLUE CROSS/BLUE SHIELD | Admitting: Family Medicine

## 2018-04-19 DIAGNOSIS — J449 Chronic obstructive pulmonary disease, unspecified: Secondary | ICD-10-CM | POA: Diagnosis not present

## 2018-04-19 DIAGNOSIS — K219 Gastro-esophageal reflux disease without esophagitis: Secondary | ICD-10-CM

## 2018-04-19 DIAGNOSIS — E785 Hyperlipidemia, unspecified: Secondary | ICD-10-CM | POA: Insufficient documentation

## 2018-04-19 MED ORDER — ESOMEPRAZOLE MAGNESIUM 20 MG PO PACK: 40.0000 mg | PACK | Freq: Every day | ORAL | 2 refills | Status: DC

## 2018-04-19 NOTE — Progress Notes (Signed)
Virtual Visit via telephone Note  This visit type was conducted due to national recommendations for restrictions regarding the COVID-19 pandemic (e.g. social distancing).  This format is felt to be most appropriate for this patient at this time.  All issues noted in this document were discussed and addressed.  No physical exam was performed (except for noted visual exam findings with Video Visits).   I connected with Jimmy Howell on 04/19/18 at  2:15 PM EDT by a video enabled telemedicine application or telephone and verified that I am speaking with the correct person using two identifiers. Location patient: work Location provider: work Persons participating in the virtual visit: patient, provider  I discussed the limitations, risks, security and privacy concerns of performing an evaluation and management service by telephone and the availability of in person appointments. I also discussed with the patient that there may be a patient responsible charge related to this service. The patient expressed understanding and agreed to proceed.  Interactive audio and video telecommunications were attempted between this provider and patient, however failed, due to patient having technical difficulties OR patient did not have access to video capability.  We continued and completed visit with audio only.   Reason for visit: follow-up.  HPI: GERD:   Reflux symptoms: has had some symptoms on nexium 20 mg daily, he has been taking nexium 40 mg daily with no symptoms over the past 3 weeks   Abd pain: no   Blood in stool: no  Dysphagia: no   EGD: none  Medication: nexium 40 mg daily  HYPERLIPIDEMIA Symptoms Chest pain on exertion:  no   Medications: Compliance- taking crestor Right upper quadrant pain- no  Muscle aches- no  COPD: Medication compliance- taking anoro once daily, albuterol once daily to help open himself up for the anoro  Rescue inhaler use- once daily though not using for  symptoms Dyspnea- no  Wheezing- no  Cough- no     ROS: See pertinent positives and negatives per HPI.  Past Medical History:  Diagnosis Date  . Asthma   . Chickenpox   . COPD (chronic obstructive pulmonary disease) (HCC)   . GERD (gastroesophageal reflux disease)   . Pneumothorax, traumatic   . Restless leg syndrome   . Shortness of breath dyspnea    with exertion  . Tobacco abuse 02/19/2015   Overview:  Last Assessment & Plan:  Quit 2 weeks ago. Patient like assistance and wants to use Wellbutrin. We'll start on Wellbutrin 150 mg daily for 3 days and then increase to 150 mg twice a day.    Past Surgical History:  Procedure Laterality Date  . FINGER SURGERY    . HERNIA REPAIR     Inguinal Hernia Repair  . VIDEO ASSISTED THORACOSCOPY (VATS)/THOROCOTOMY Right 08/20/2015   Procedure: PREOP BRONCH, THORACOTOMY, RIGHT UPPER LOBECTOMY;  Surgeon: Hulda Marin, MD;  Location: [facility] ORS;  Service: General;  Laterality: Right;    Family History  Problem Relation Age of Onset  . Bladder Cancer Father   . Cancer Paternal Aunt        stomach  . Arthritis Unknown        Parent  . Hyperlipidemia Unknown        Parent  . Heart disease Unknown        Parent  . Hypertension Unknown        Parent  . Kidney disease Unknown        Parent    SOCIAL HX: Former smoker.  Current Outpatient Medications:  .  ANORO ELLIPTA 62.5-25 MCG/INH AEPB, INHALE 1 PUFF INTO THE LUNGS DAILY., Disp: 60 each, Rfl: 5 .  esomeprazole (NEXIUM) 20 MG packet, Take 40 mg by mouth daily before breakfast., Disp: 60 each, Rfl: 2 .  rosuvastatin (CRESTOR) 20 MG tablet, Take 1 tablet (20 mg total) by mouth daily., Disp: 90 tablet, Rfl: 3 .  VENTOLIN HFA 108 (90 Base) MCG/ACT inhaler, INHALE 2 PUFFS INTO THE LUNGS EVERY 4 HOURS AS NEEDED FOR WHEEZING OR SHORTNESS OF BREATH., Disp: 18 g, Rfl: 3  Current Facility-Administered Medications:  .  ipratropium-albuterol (DUONEB) 0.5-2.5 (3) MG/3ML nebulizer solution 3 mL, 3  mL, Nebulization, Q6H, Allegra GranaArnett, Margaret G, FNP, 3 mL at 01/01/18 1024  EXAM: No physical exam was completed as this was a telehealth telephone visit.  ASSESSMENT AND PLAN:  Discussed the following assessment and plan:  Chronic obstructive pulmonary disease, unspecified COPD type (HCC)  Gastroesophageal reflux disease, esophagitis presence not specified - Plan: Ambulatory referral to Gastroenterology  Hyperlipidemia, unspecified hyperlipidemia type  COPD (chronic obstructive pulmonary disease) (HCC) Overall doing quite well.  Discussed that he should only use the albuterol for symptoms such as wheezing, cough, or dyspnea.  He will discontinue use of this in the morning.  If he starts to have issues with stopping this he can resume it once daily.  Acid reflux Patient continues to have issues with this.  They have improved with Nexium 40 mg daily though I did discuss that this is not an optimal dose.  Given his persistent symptoms will refer to GI for consideration of an endoscopy.  Refill of Nexium 40 mg daily given.  Hyperlipidemia Tolerating Crestor.  LDL did trend down.  No lab work needed today.  He will continue Crestor.  Social distancing precautions and sick precautions were discussed regarding coronavirus.  I discussed the assessment and treatment plan with the patient. The patient was provided an opportunity to ask questions and all were answered. The patient agreed with the plan and demonstrated an understanding of the instructions.   The patient was advised to call back or seek an in-person evaluation if the symptoms worsen or if the condition fails to improve as anticipated.  I provided 18 minutes of non-face-to-face time during this encounter.   Marikay AlarEric Margery Szostak, MD

## 2018-04-19 NOTE — Telephone Encounter (Signed)
Please contact the patient and get him set up for 21-month follow-up in the office.  Thanks.

## 2018-04-19 NOTE — Assessment & Plan Note (Signed)
Patient continues to have issues with this.  They have improved with Nexium 40 mg daily though I did discuss that this is not an optimal dose.  Given his persistent symptoms will refer to GI for consideration of an endoscopy.  Refill of Nexium 40 mg daily given.

## 2018-04-19 NOTE — Assessment & Plan Note (Signed)
Tolerating Crestor.  LDL did trend down.  No lab work needed today.  He will continue Crestor.

## 2018-04-19 NOTE — Telephone Encounter (Signed)
Called pt and was unable to leave a VM to call back due to mailbox being full at this time. 

## 2018-04-19 NOTE — Assessment & Plan Note (Signed)
Overall doing quite well.  Discussed that he should only use the albuterol for symptoms such as wheezing, cough, or dyspnea.  He will discontinue use of this in the morning.  If he starts to have issues with stopping this he can resume it once daily.

## 2018-04-20 NOTE — Telephone Encounter (Signed)
Called and spoke with pt. Pt has been scheduled for 4 month f/u 08/20/2018 @ 4 PM.

## 2018-04-23 ENCOUNTER — Encounter: Payer: Self-pay | Admitting: *Deleted

## 2018-05-05 ENCOUNTER — Other Ambulatory Visit: Payer: Self-pay | Admitting: Family Medicine

## 2018-06-05 ENCOUNTER — Other Ambulatory Visit: Payer: Self-pay | Admitting: Family Medicine

## 2018-06-17 ENCOUNTER — Telehealth: Payer: Self-pay

## 2018-06-17 NOTE — Telephone Encounter (Signed)
Copied from [place] [phone]. Topic: General - Other >> Jun 17, 2018  8:05 AM Oneta Rack wrote: Relation to pt: self  Call back Ed[place]   Reason for call: Patient states a colleague at his job was dx with COVID, patient not experiencing no symptoms but would like to be tested due to exposure, patient requesting PCP place order.

## 2018-06-18 NOTE — Telephone Encounter (Signed)
Please find out what kind of exposure the patient had. Was he working in close proximity? Where does he work? Typically testing is reserved for patients that are symptomatic. Please also see if his work has a protocol in place for exposures to [place].

## 2018-06-18 NOTE — Telephone Encounter (Signed)
Attempted to call pt to screen for COVID symptoms.  Phone was out of service.

## 2018-06-24 ENCOUNTER — Other Ambulatory Visit: Payer: Self-pay | Admitting: Internal Medicine

## 2018-07-14 ENCOUNTER — Other Ambulatory Visit: Payer: Self-pay | Admitting: Family Medicine

## 2018-07-14 NOTE — Telephone Encounter (Signed)
Attempted to call patient again to check on patient.  Phone not in service.  Patient does not have MyChart.

## 2018-08-12 IMAGING — US US ABDOMEN COMPLETE
1 series · 13 of 25 positions shown · non-contrast
Comparison: 04/11/2015 PET-CT.

CLINICAL DATA: 45-year-old male with right upper quadrant pain
since thoracotomy. Initial encounter.

EXAM:
ABDOMEN ULTRASOUND COMPLETE

[Series 1: us abdomen complete · 0.33mm/px · 13 of 96 slices shown]
[im 1/96]
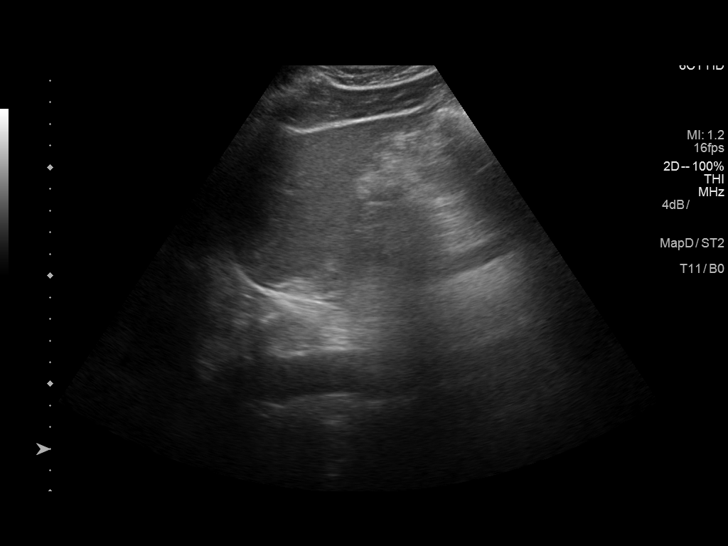
[im 8/96]
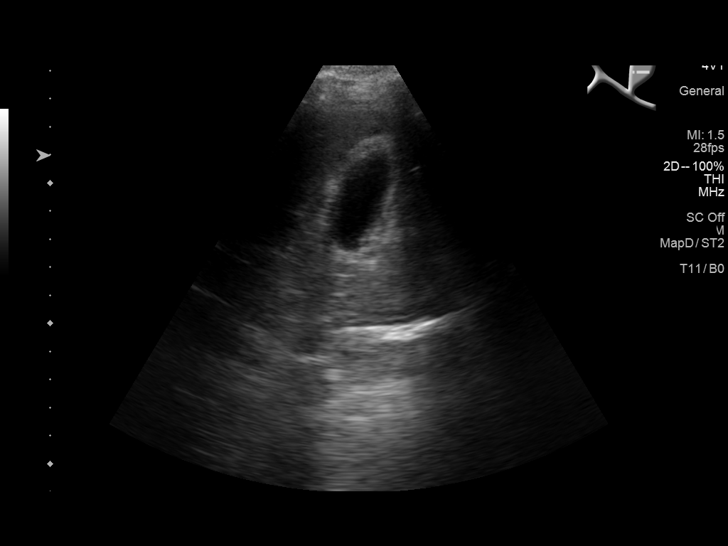
[im 16/96]
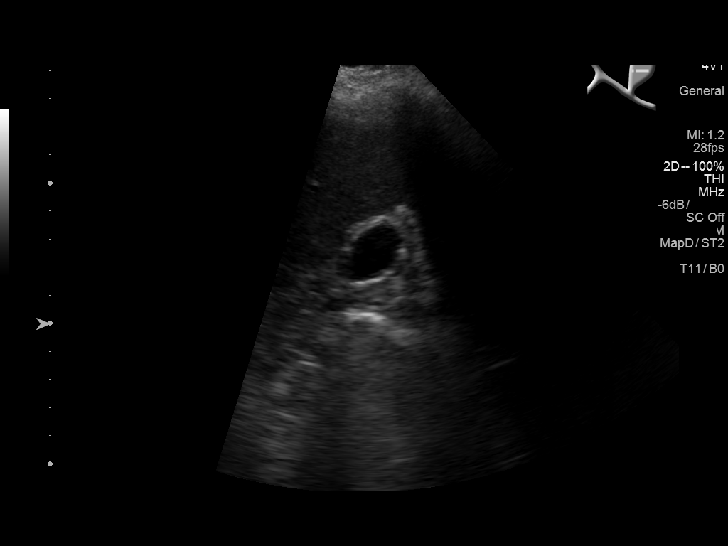
[im 24/96]
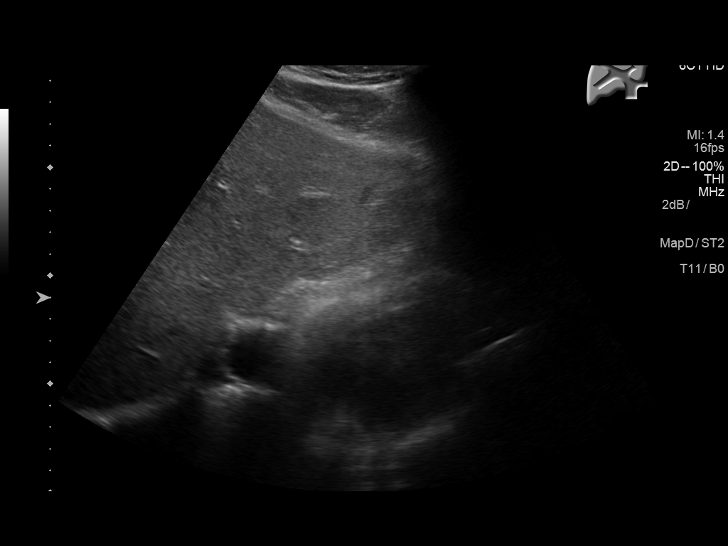
[im 32/96]
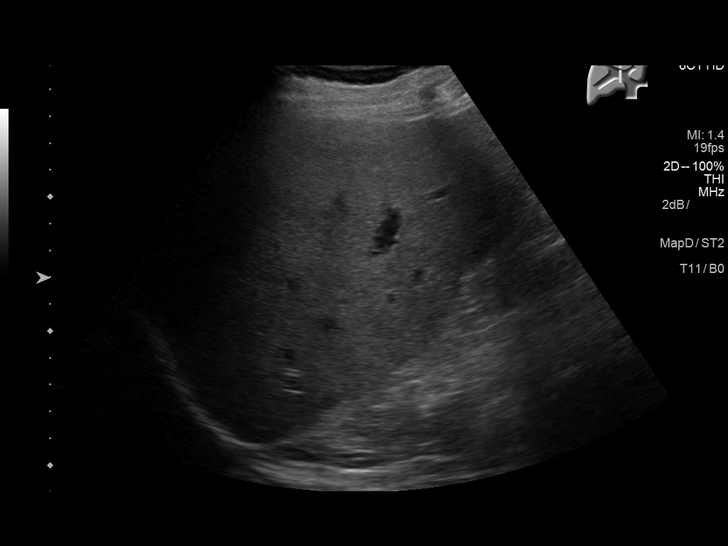
[im 40/96]
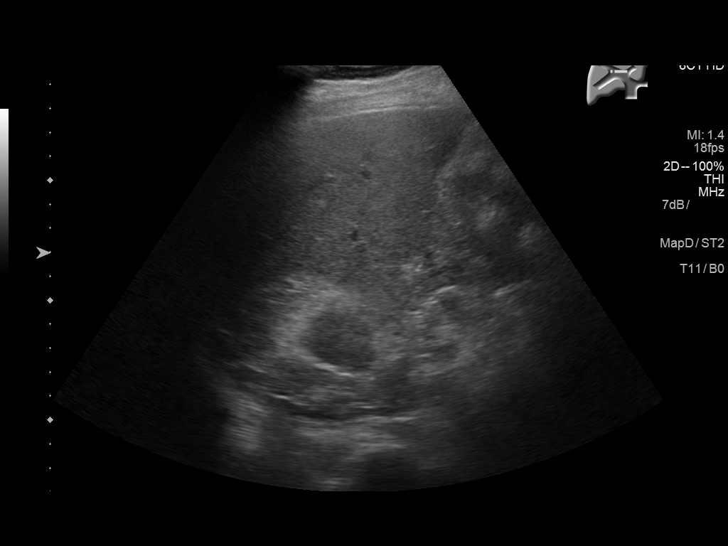
[im 48/96]
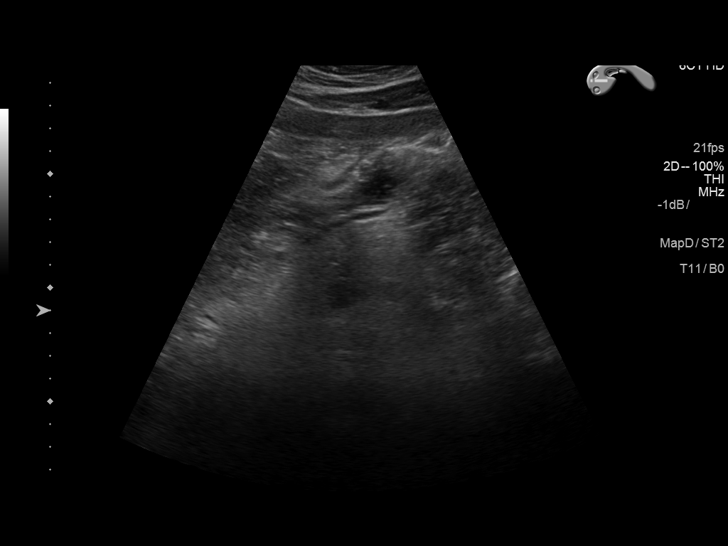
[im 56/96]
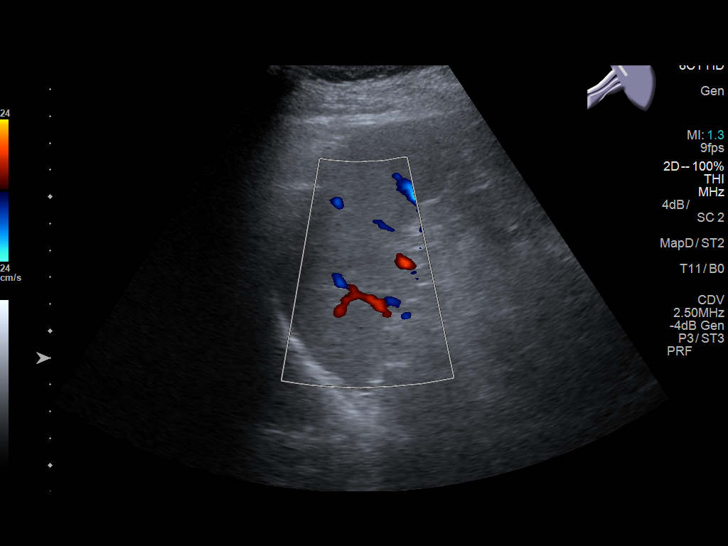
[im 64/96]
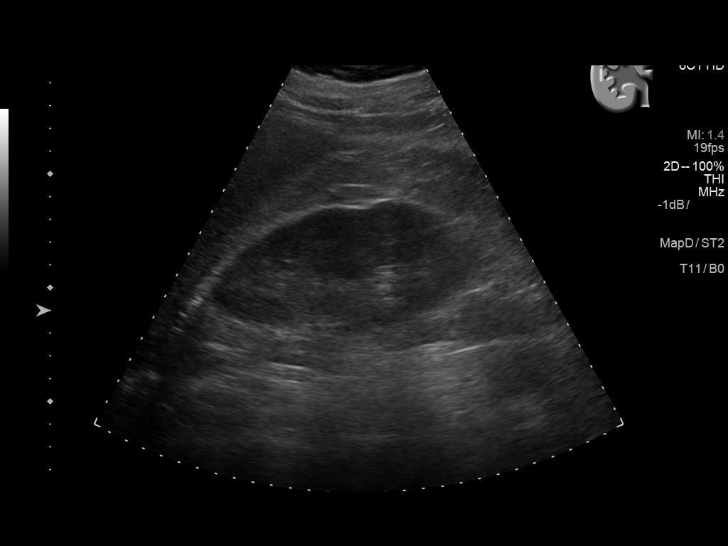
[im 72/96]
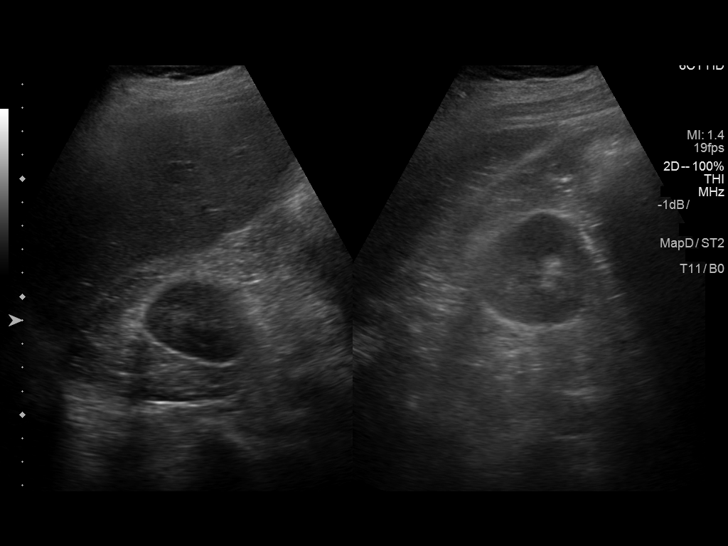
[im 80/96]
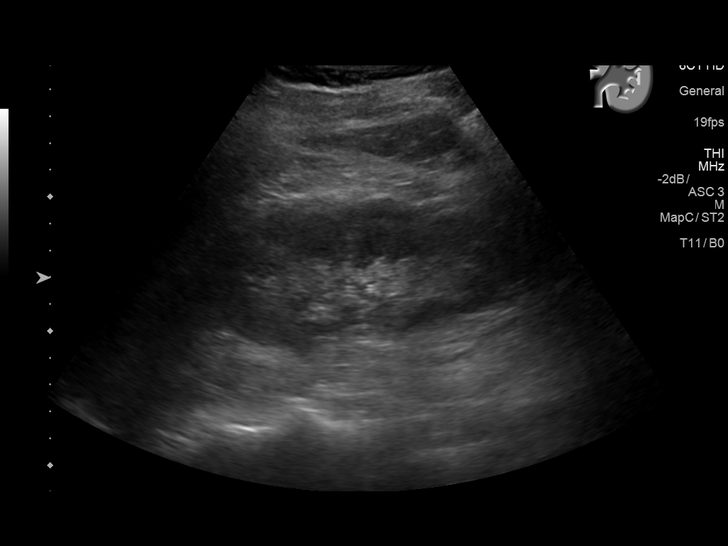
[im 88/96]
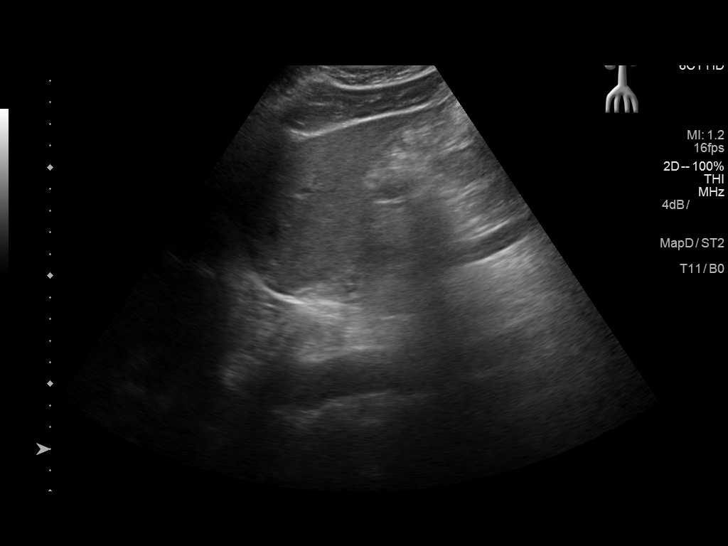
[im 96/96]
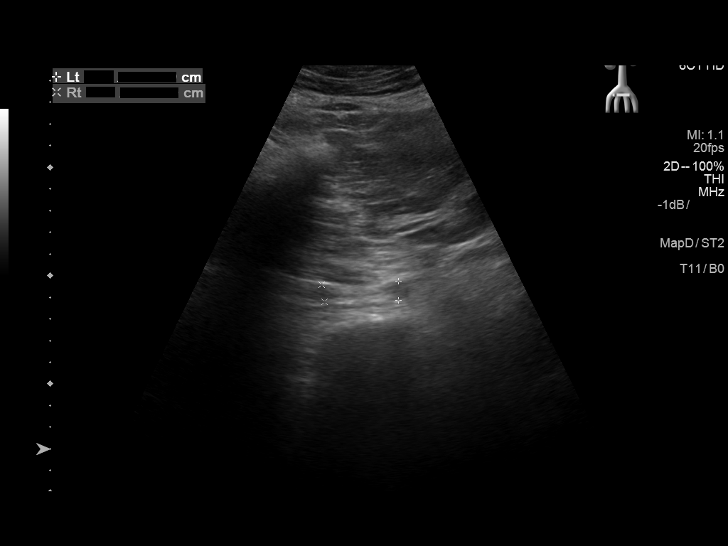

[13 of 25 positions shown; findings below may reference images not displayed]

FINDINGS: Gallbladder: No gallstones. Gallbladder wall slightly thickened
measuring up to 3.4 mm. No pericholecystic fluid. Patient not tender
over this region during scanning per ultrasound technologist.

Common bile duct: Diameter: 4 mm.

Liver: Fatty liver.  Calcification right lobe.  No discrete mass.

IVC: No abnormality visualized.

Pancreas: Evaluation limited by bowel gas. Portions visualized
unremarkable.

Spleen: Size and appearance within normal limits.

Right Kidney: Length: 13.5 cm. Echogenicity within normal limits. No
mass or hydronephrosis visualized.

Left Kidney: Length: 13.4 cm. Echogenicity within normal limits. No
mass or hydronephrosis visualized.

Abdominal aorta: Evaluation limited by bowel gas. Portions
visualized appear unremarkable

Other findings: None.
IMPRESSION: Gallbladder wall slightly thickened measuring up to 3.4 mm. No
gallstones or pericholecystic fluid. Patient was not tender over
this region during scanning per ultrasound technologist. It is
possible this appearance is related to mild contraction.

Fatty liver containing tiny calcification within the right lobe.

Evaluation of the pancreas and portions of the abdominal aorta
limited by bowel gas. The portions which are visualized appear
unremarkable.

## 2018-08-20 ENCOUNTER — Ambulatory Visit (INDEPENDENT_AMBULATORY_CARE_PROVIDER_SITE_OTHER): Payer: BC Managed Care – PPO | Admitting: Family Medicine

## 2018-08-20 ENCOUNTER — Other Ambulatory Visit: Payer: Self-pay

## 2018-08-20 ENCOUNTER — Encounter: Payer: Self-pay | Admitting: Family Medicine

## 2018-08-20 DIAGNOSIS — E785 Hyperlipidemia, unspecified: Secondary | ICD-10-CM | POA: Diagnosis not present

## 2018-08-20 DIAGNOSIS — K219 Gastro-esophageal reflux disease without esophagitis: Secondary | ICD-10-CM

## 2018-08-20 DIAGNOSIS — Z8619 Personal history of other infectious and parasitic diseases: Secondary | ICD-10-CM | POA: Diagnosis not present

## 2018-08-20 DIAGNOSIS — Z8616 Personal history of COVID-19: Secondary | ICD-10-CM | POA: Insufficient documentation

## 2018-08-20 DIAGNOSIS — J449 Chronic obstructive pulmonary disease, unspecified: Secondary | ICD-10-CM

## 2018-08-20 MED ORDER — OMEPRAZOLE 20 MG PO CPDR: 20.0000 mg | DELAYED_RELEASE_CAPSULE | Freq: Every day | ORAL | 3 refills | Status: DC

## 2018-08-20 NOTE — Assessment & Plan Note (Signed)
Well-controlled.  Continue current regimen. 

## 2018-08-20 NOTE — Assessment & Plan Note (Signed)
Continue Crestor 

## 2018-08-20 NOTE — Assessment & Plan Note (Signed)
Previously tested positive for this.  He has been asymptomatic for a number of months now.  I discussed that this does not necessarily mean that he is immune and he needs to continue to practice good social distancing practices and monitor for any symptoms.  Given social distancing precautions and sick precautions.

## 2018-08-20 NOTE — Progress Notes (Signed)
Virtual Visit via telephone Note  This visit type was conducted due to national recommendations for restrictions regarding the COVID-19 pandemic (e.g. social distancing).  This format is felt to be most appropriate for this patient at this time.  All issues noted in this document were discussed and addressed.  No physical exam was performed (except for noted visual exam findings with Video Visits).   I connected with Jimmy Howell today at  4:00 PM EDT by telephone and verified that I am speaking with the correct person using two identifiers. Location patient: home Location provider: work Persons participating in the virtual visit: patient, provider  I discussed the limitations, risks, security and privacy concerns of performing an evaluation and management service by telephone and the availability of in person appointments. I also discussed with the patient that there may be a patient responsible charge related to this service. The patient expressed understanding and agreed to proceed.  Interactive audio and video telecommunications were attempted between this provider and patient, however failed, due to patient having technical difficulties OR patient did not have access to video capability.  We continued and completed visit with audio only.   Reason for visit: follow-up  HPI: COPD: Medication compliance- taking anoro  Rescue inhaler use- 1x/day - is helpful, if does not use is a "little touchy" Dyspnea- no  Wheezing- no  Cough- no    GERD:   Reflux symptoms: no symptoms with nexium, insurance won't pay any more   Abd pain: no   Blood in stool: no  Dysphagia: no   EGD: no - deferred given COVID19 pandemic  Medication: nexium  HYPERLIPIDEMIA Symptoms Chest pain on exertion:  no   Leg claudication:   no Medications: Compliance- taking crestor Right upper quadrant pain- no  Muscle aches- no  Positive COVID19 test: 3 months ago tested positive. Quarantined for 2 weeks. Had fever.  Feels fine now.   ROS: See pertinent positives and negatives per HPI.  Past Medical History:  Diagnosis Date  . Asthma   . Chickenpox   . COPD (chronic obstructive pulmonary disease) ([place])   . GERD (gastroesophageal reflux disease)   . Pneumothorax, traumatic   . Restless leg syndrome   . Shortness of breath dyspnea    with exertion  . Tobacco abuse 02/19/2015   Overview:  Last Assessment & Plan:  Quit 2 weeks ago. Patient like assistance and wants to use Wellbutrin. We'll start on Wellbutrin 150 mg daily for 3 days and then increase to 150 mg twice a day.    Past Surgical History:  Procedure Laterality Date  . FINGER SURGERY    . HERNIA REPAIR     Inguinal Hernia Repair  . VIDEO ASSISTED THORACOSCOPY (VATS)/THOROCOTOMY Right 08/20/2015   Procedure: PREOP BRONCH, THORACOTOMY, RIGHT UPPER LOBECTOMY;  Surgeon: Nestor Lewandowsky, MD;  Location: [facility] ORS;  Service: General;  Laterality: Right;    Family History  Problem Relation Age of Onset  . Bladder Cancer Father   . Cancer Paternal Aunt        stomach  . Arthritis Unknown        Parent  . Hyperlipidemia Unknown        Parent  . Heart disease Unknown        Parent  . Hypertension Unknown        Parent  . Kidney disease Unknown        Parent    SOCIAL HX: former smoker   Current Outpatient Medications:  .  ANORO ELLIPTA 62.5-25 MCG/INH AEPB, INHALE 1 PUFF INTO THE LUNGS DAILY., Disp: 60 each, Rfl: 5 .  esomeprazole (NEXIUM) 20 MG capsule, TAKE 1 CAPSULE BY MOUTH EVERY DAY BEFORE BREAKFAST, Disp: 30 capsule, Rfl: 0 .  esomeprazole (NEXIUM) 20 MG packet, Take 40 mg by mouth daily before breakfast., Disp: 60 each, Rfl: 2 .  omeprazole (PRILOSEC) 20 MG capsule, Take 1 capsule (20 mg total) by mouth daily., Disp: 30 capsule, Rfl: 3 .  rosuvastatin (CRESTOR) 20 MG tablet, Take 1 tablet (20 mg total) by mouth daily., Disp: 90 tablet, Rfl: 3 .  VENTOLIN HFA 108 (90 Base) MCG/ACT inhaler, INHALE 2 PUFFS INTO THE LUNGS EVERY 4 HOURS  AS NEEDED FOR WHEEZING OR SHORTNESS OF BREATH, Disp: 18 Inhaler, Rfl: 2  Current Facility-Administered Medications:  .  ipratropium-albuterol (DUONEB) 0.5-2.5 (3) MG/3ML nebulizer solution 3 mL, 3 mL, Nebulization, Q6H, Allegra GranaArnett, Margaret G, FNP, 3 mL at 01/01/18 1024  EXAM: This was a telehealth telephone visit and thus no physical exam was performed.   ASSESSMENT AND PLAN:  Discussed the following assessment and plan:  Acid reflux Adequately controlled on Nexium does insurance will pay for this.  We will switch him to omeprazole.  He will let us know if this is not beneficial.  He continues to defer GI evaluation until COVID-19 is improved.    COPD (chronic obstructive pulmonary disease) (HCC) Well-controlled.  Continue current regimen.  Hyperlipidemia Continue Crestor.  History of 2019 novel coronavirus disease (COVID-19) Previously tested positive for this.  He has been asymptomatic for a number of months now.  I discussed that this does not necessarily mean that he is immune and he needs to continue to practice good social distancing practices and monitor for any symptoms.  Given social distancing precautions and sick precautions.    I discussed the assessment and treatment plan with the patient. The patient was provided an opportunity to ask questions and all were answered. The patient agreed with the plan and demonstrated an understanding of the instructions.   The patient was advised to call back or seek an in-person evaluation if the symptoms worsen or if the condition fails to improve as anticipated.  I provided 10 minutes of non-face-to-face time during this encounter.   Marikay AlarEric Sonnenberg, MD

## 2018-08-20 NOTE — Assessment & Plan Note (Signed)
Adequately controlled on Nexium does insurance will pay for this.  We will switch him to omeprazole.  He will let us know if this is not beneficial.  He continues to defer GI evaluation until COVID-19 is improved.

## 2018-10-03 ENCOUNTER — Other Ambulatory Visit: Payer: Self-pay | Admitting: Internal Medicine

## 2018-11-08 ENCOUNTER — Ambulatory Visit (INDEPENDENT_AMBULATORY_CARE_PROVIDER_SITE_OTHER): Payer: BC Managed Care – PPO | Admitting: Internal Medicine

## 2018-11-08 ENCOUNTER — Other Ambulatory Visit: Payer: Self-pay

## 2018-11-08 ENCOUNTER — Encounter: Payer: Self-pay | Admitting: Internal Medicine

## 2018-11-08 VITALS — BP 140/80 | HR 75 | Temp 98.1°F | Ht 69.0 in | Wt 214.4 lb

## 2018-11-08 DIAGNOSIS — J449 Chronic obstructive pulmonary disease, unspecified: Secondary | ICD-10-CM | POA: Diagnosis not present

## 2018-11-08 DIAGNOSIS — Z23 Encounter for immunization: Secondary | ICD-10-CM | POA: Diagnosis not present

## 2018-11-08 NOTE — Progress Notes (Signed)
[facility] Consultation      Date: 11/08/2018,   MRN# [id number] CHAYNCE Howell 04-07-1970     AdmissionWeight: 214 lb 6.4 oz (97.3 kg)                 CurrentWeight: 214 lb 6.4 oz (97.3 kg) Jimmy Howell is a 48 y.o. old male seen in consultation for COPD and lung nodules at the request of Dr. Caryl Bis   PFT reviewed 04/2015 Ratio 61% FEV1 54% FVC 69% RV 324% TLC 154%    CHIEF COMPLAINT:   Follow-up COPD History of MAI   HISTORY OF PRESENT ILLNESS   Patient shortness of breath and dyspnea exertion stabilized with inhaler therapy  Quit smoking 3 years ago No signs of infection  No  COPD exacerbation at this time No evidence of heart failure at this time No evidence or signs of infection at this time No respiratory distress No fevers, chills, nausea, vomiting, diarrhea No evidence of lower extremity edema No evidence hemoptysis  Use albuterol as needed  Status post thoracotomy revealed 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  ANORO ELLIPTA 62.5-25 MCG/INH AEPB, INHALE 1 PUFF INTO THE LUNGS DAILY, Disp: 60 each, Rfl: 4 .  esomeprazole (NEXIUM) 20 MG capsule, TAKE 1 CAPSULE BY MOUTH EVERY DAY BEFORE BREAKFAST, Disp: 30 capsule, Rfl: 0 .  esomeprazole (NEXIUM) 20 MG packet, Take 40 mg by mouth daily before breakfast., Disp: 60 each, Rfl: 2 .  omeprazole (PRILOSEC) 20 MG capsule, Take 1 capsule (20 mg total) by mouth daily., Disp: 30 capsule, Rfl: 3 .  rosuvastatin (CRESTOR) 20 MG tablet, Take 1 tablet (20 mg total) by mouth daily., Disp: 90 tablet, Rfl: 3 .  VENTOLIN HFA 108 (90 Base) MCG/ACT inhaler, INHALE 2 PUFFS INTO THE LUNGS EVERY 4 HOURS AS NEEDED FOR WHEEZING OR SHORTNESS OF BREATH, Disp: 18 Inhaler, Rfl: 2  Current Facility-Administered Medications:  .  ipratropium-albuterol (DUONEB) 0.5-2.5  (3) MG/3ML nebulizer solution 3 mL, 3 mL, Nebulization, Q6H, Arnett, Yvetta Coder, FNP, 3 mL at 01/01/18 1024    ALLERGIES   Chantix [varenicline]   BP 140/80 (BP Location: Left Arm, Cuff Size: Normal)   Pulse 75   Temp 98.1 F (36.7 C) (Temporal)   Ht 5\' 9"  (1.753 m)   Wt 214 lb 6.4 oz (97.3 kg)   SpO2 97%   BMI 31.66 kg/m    REVIEW OF SYSTEMS   Review of Systems  Constitutional: Negative for chills, fever, malaise/fatigue and weight loss.  HENT: Negative for congestion.   Respiratory: Positive for shortness of breath. Negative for cough, hemoptysis, sputum production and wheezing.   Cardiovascular: Negative for chest pain, palpitations, orthopnea and leg swelling.  Gastrointestinal: Negative for heartburn and nausea.  Neurological: Negative for tingling.  Psychiatric/Behavioral: The patient is not nervous/anxious.   All other systems reviewed and are negative.   .vs.vs    PHYSICAL EXAM  Physical Exam  Constitutional: He is oriented to person, place, and time. No distress.  Eyes: EOM are normal.  Cardiovascular: Normal rate, regular rhythm and normal heart sounds.  No murmur heard. Pulmonary/Chest: Effort normal and breath sounds normal. No stridor. No respiratory distress. He has no wheezes. He has no rales.  Neurological: He is alert and oriented to person, place, and time.  Skin: Skin is  warm. He is not diaphoretic.  Psychiatric: He has a normal mood and affect.    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  The importance of social distancing was discussed today. Hand Washing Techniques and avoid touching face was advised.     MEDICATION ADJUSTMENTS/LABS AND TESTS ORDERED: ANORO as  prescribed Albuterol as needed  CURRENT MEDICATIONS REVIEWED AT LENGTH WITH PATIENT TODAY   Patient satisfied with Plan of action and management. All questions answered  Follow up in 1 year   Uriah Trueba Santiago Glad, M.D.  [facility]  Medical Director [facility] Medical Director [facility] Cardio-Pulmonary Department

## 2018-11-08 NOTE — Patient Instructions (Signed)
Continue inhalers as prescribed 

## 2018-11-11 ENCOUNTER — Other Ambulatory Visit: Payer: Self-pay | Admitting: Internal Medicine

## 2018-11-26 ENCOUNTER — Telehealth: Payer: Self-pay | Admitting: Family Medicine

## 2018-11-26 NOTE — Telephone Encounter (Signed)
Pt called about having a possible sinus infection and congestion. Please advise and Thank you!  Call pt @ [phone].

## 2018-11-26 NOTE — Telephone Encounter (Signed)
Patient states he has a possible sinus infection there are no openings. Please advise.  Nina,cma

## 2018-11-26 NOTE — Telephone Encounter (Signed)
I called and spoke with the patient and informed him because of his sinus pain he should go to urgent care because we have no openings and the patient understood.  Natalyia Innes,cma

## 2018-11-26 NOTE — Telephone Encounter (Signed)
Go to urgent care

## 2018-11-26 NOTE — Telephone Encounter (Signed)
Corrrection sent to [facility]

## 2018-11-30 ENCOUNTER — Other Ambulatory Visit: Payer: Self-pay | Admitting: Internal Medicine

## 2018-12-22 ENCOUNTER — Other Ambulatory Visit: Payer: Self-pay | Admitting: Family Medicine

## 2018-12-22 DIAGNOSIS — E785 Hyperlipidemia, unspecified: Secondary | ICD-10-CM

## 2018-12-23 ENCOUNTER — Other Ambulatory Visit: Payer: Self-pay | Admitting: Family Medicine

## 2018-12-23 DIAGNOSIS — E785 Hyperlipidemia, unspecified: Secondary | ICD-10-CM

## 2019-02-07 ENCOUNTER — Other Ambulatory Visit: Payer: Self-pay | Admitting: Internal Medicine

## 2019-05-09 ENCOUNTER — Other Ambulatory Visit: Payer: Self-pay | Admitting: Internal Medicine

## 2019-06-08 ENCOUNTER — Other Ambulatory Visit: Payer: Self-pay | Admitting: Family Medicine

## 2019-06-30 ENCOUNTER — Other Ambulatory Visit: Payer: Self-pay | Admitting: Family Medicine

## 2019-06-30 DIAGNOSIS — E785 Hyperlipidemia, unspecified: Secondary | ICD-10-CM

## 2019-08-10 ENCOUNTER — Other Ambulatory Visit: Payer: Self-pay | Admitting: Internal Medicine

## 2019-10-03 ENCOUNTER — Other Ambulatory Visit: Payer: Self-pay | Admitting: Family Medicine

## 2019-10-03 DIAGNOSIS — E785 Hyperlipidemia, unspecified: Secondary | ICD-10-CM

## 2019-11-02 ENCOUNTER — Other Ambulatory Visit: Payer: Self-pay | Admitting: Internal Medicine

## 2019-12-08 ENCOUNTER — Other Ambulatory Visit: Payer: Self-pay

## 2019-12-08 ENCOUNTER — Ambulatory Visit (INDEPENDENT_AMBULATORY_CARE_PROVIDER_SITE_OTHER): Payer: BC Managed Care – PPO | Admitting: Internal Medicine

## 2019-12-08 ENCOUNTER — Encounter: Payer: Self-pay | Admitting: Internal Medicine

## 2019-12-08 VITALS — BP 140/100 | HR 79 | Temp 98.4°F | Ht 69.0 in | Wt 223.2 lb

## 2019-12-08 DIAGNOSIS — Z23 Encounter for immunization: Secondary | ICD-10-CM | POA: Diagnosis not present

## 2019-12-08 DIAGNOSIS — J449 Chronic obstructive pulmonary disease, unspecified: Secondary | ICD-10-CM

## 2019-12-08 NOTE — Progress Notes (Signed)
[facility] Consultation      Date: 12/08/2019,   MRN# [id number] Jimmy Howell 10-16-1970     Admission                  Current  Jimmy Howell is a 49 y.o. old male seen in consultation for COPD and lung nodules at the request of Dr. 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, h/o tobacco abuse and COPD   PFT reviewed 04/2015 Ratio 61% FEV1 54% FVC 69% RV 324% TLC 154%    CHIEF COMPLAINT:    Follow-up COPD Previous history of MAI pulmonary infection   HISTORY OF PRESENT ILLNESS   Patient shortness of breath and dyspnea exertion has stabilized with inhaler therapy  Continues to take inhalers as prescribed  Quit smoking 4 years ago  No exacerbation at this time No evidence of heart failure at this time No evidence or signs of infection at this time No respiratory distress No fevers, chills, nausea, vomiting, diarrhea No evidence of lower extremity edema No evidence hemoptysis   Use albuterol as needed  COPD is under control at this time Avoid secondhand smoke    Patient has gained a significant amount of weight over the last year and a half Original weight was 170 now weighs approximately 225 pounds Which is increased weight gain of 55 pounds    Current Outpatient Medications:  .  ANORO ELLIPTA 62.5-25 MCG/INH AEPB, INHALE 1 PUFF INTO THE LUNGS DAILY., Disp: 60 each, Rfl: 5 .  esomeprazole (NEXIUM) 20 MG capsule, TAKE 1 CAPSULE BY MOUTH EVERY DAY BEFORE BREAKFAST, Disp: 30 capsule, Rfl: 0 .  esomeprazole (NEXIUM) 20 MG packet, Take 40 mg by mouth daily before breakfast., Disp: 60 each, Rfl: 2 .  omeprazole (PRILOSEC) 20 MG capsule, TAKE 1 CAPSULE BY MOUTH DAILY, Disp: 30 capsule, Rfl: 3 .  rosuvastatin (CRESTOR) 20 MG tablet, TAKE 1 TABLET BY MOUTH ONCE DAILY, Disp: 90 tablet, Rfl: 0 .  VENTOLIN HFA 108 (90 Base) MCG/ACT inhaler, INHALE 2 PUFFS  INTO THE LUNGS EVERY 4 HOURS AS NEEDED FOR WHEEZING OR SHORTNESS OF BREATH., Disp: 18 g, Rfl: 3  Current Facility-Administered Medications:  .  ipratropium-albuterol (DUONEB) 0.5-2.5 (3) MG/3ML nebulizer solution 3 mL, 3 mL, Nebulization, Q6H, Arnett, Lyn Records, FNP, 3 mL at 01/01/18 1024     REVIEW OF SYSTEMS   Review of Systems  Constitutional: Negative for chills, fever, malaise/fatigue and weight loss.  HENT: Negative for congestion.   Respiratory: Positive for shortness of breath. Negative for cough, hemoptysis, sputum production and wheezing.   Cardiovascular: Negative for chest pain, palpitations, orthopnea and leg swelling.  Gastrointestinal: Negative for heartburn and nausea.  Neurological: Negative for tingling.  Psychiatric/Behavioral: The patient is not nervous/anxious.   All other systems reviewed and are negative.   .vs.vs    PHYSICAL EXAM  Physical Exam Constitutional:      General: He is not in acute distress.    Appearance: He is not diaphoretic.  Cardiovascular:     Rate and Rhythm: Normal rate and regular rhythm.     Heart sounds: Normal heart sounds. No murmur heard.   Pulmonary:     Effort: Pulmonary effort is normal. No respiratory distress.     Breath sounds: Normal breath sounds. No stridor. No wheezing or rales.  Skin:    General: Skin is warm.  Neurological:     Mental Status:  He is alert and oriented to person, place, and time.     ASSESSMENT/PLAN   49 year old pleasant white male seen for moderate COPD Gold stage A History of non-TB-May pulmonary function that was treated with surgical resection along with 9 months of medical therapy   COPD stable continue inhalers as prescribed No indication for prednisone or antibiotics at this time Advised to avoid secondhand smoke exposure  Obesity -recommend significant weight loss -recommend changing diet  Deconditioned state -Recommend increased daily activity and exercise   COVID-19  EDUCATION: The signs and symptoms of COVID-19 were discussed with the patient and how to seek care for testing.  The importance of social distancing was discussed today. Hand Washing Techniques and avoid touching face was advised.     MEDICATION ADJUSTMENTS/LABS AND TESTS ORDERED: ANORO as  prescribed Albuterol as needed  CURRENT MEDICATIONS REVIEWED AT LENGTH WITH PATIENT TODAY   Patient satisfied with Plan of action and management. All questions answered  Follow up in 1 year  Total time spent 24 minutes   Wallis Bamberg Santiago Glad, M.D.  [facility]  Medical Director [facility] Medical Director [facility] Cardio-Pulmonary Department

## 2019-12-08 NOTE — Patient Instructions (Addendum)
ANORO as  prescribed Albuterol as needed RECOMMEND WEIGHT LOSS

## 2019-12-16 ENCOUNTER — Encounter: Payer: BC Managed Care – PPO | Admitting: Family Medicine

## 2020-01-04 ENCOUNTER — Other Ambulatory Visit: Payer: Self-pay | Admitting: Family Medicine

## 2020-01-04 ENCOUNTER — Other Ambulatory Visit: Payer: Self-pay | Admitting: Internal Medicine

## 2020-01-04 DIAGNOSIS — E785 Hyperlipidemia, unspecified: Secondary | ICD-10-CM

## 2020-01-18 ENCOUNTER — Encounter: Payer: BC Managed Care – PPO | Admitting: Family Medicine

## 2020-02-03 ENCOUNTER — Encounter: Payer: BC Managed Care – PPO | Admitting: Family Medicine

## 2020-02-22 ENCOUNTER — Other Ambulatory Visit: Payer: Self-pay

## 2020-02-23 ENCOUNTER — Encounter: Payer: Self-pay | Admitting: Family Medicine

## 2020-02-23 ENCOUNTER — Ambulatory Visit (INDEPENDENT_AMBULATORY_CARE_PROVIDER_SITE_OTHER): Payer: BC Managed Care – PPO | Admitting: Family Medicine

## 2020-02-23 ENCOUNTER — Other Ambulatory Visit: Payer: Self-pay | Admitting: Family Medicine

## 2020-02-23 VITALS — BP 120/80 | HR 85 | Temp 98.0°F | Ht 69.0 in | Wt 220.0 lb

## 2020-02-23 DIAGNOSIS — Z23 Encounter for immunization: Secondary | ICD-10-CM | POA: Diagnosis not present

## 2020-02-23 DIAGNOSIS — Z0000 Encounter for general adult medical examination without abnormal findings: Secondary | ICD-10-CM | POA: Insufficient documentation

## 2020-02-23 DIAGNOSIS — E785 Hyperlipidemia, unspecified: Secondary | ICD-10-CM

## 2020-02-23 DIAGNOSIS — N529 Male erectile dysfunction, unspecified: Secondary | ICD-10-CM | POA: Insufficient documentation

## 2020-02-23 DIAGNOSIS — E669 Obesity, unspecified: Secondary | ICD-10-CM | POA: Diagnosis not present

## 2020-02-23 DIAGNOSIS — Z1211 Encounter for screening for malignant neoplasm of colon: Secondary | ICD-10-CM

## 2020-02-23 DIAGNOSIS — Z125 Encounter for screening for malignant neoplasm of prostate: Secondary | ICD-10-CM

## 2020-02-23 DIAGNOSIS — Z0001 Encounter for general adult medical examination with abnormal findings: Secondary | ICD-10-CM | POA: Insufficient documentation

## 2020-02-23 DIAGNOSIS — R918 Other nonspecific abnormal finding of lung field: Secondary | ICD-10-CM

## 2020-02-23 LAB — COMPREHENSIVE METABOLIC PANEL
ALT: 43 U/L (ref 0–53)
AST: 21 U/L (ref 0–37)
Albumin: 4.6 g/dL (ref 3.5–5.2)
Alkaline Phosphatase: 56 U/L (ref 39–117)
BUN: 15 mg/dL (ref 6–23)
CO2: 28 mEq/L (ref 19–32)
Calcium: 9.7 mg/dL (ref 8.4–10.5)
Chloride: 105 mEq/L (ref 96–112)
Creatinine, Ser: 0.7 mg/dL (ref 0.40–1.50)
GFR: 107.78 mL/min (ref >60.00)
Glucose, Bld: 104 mg/dL — ABNORMAL HIGH (ref 70–99)
Potassium: 3.9 mEq/L (ref 3.5–5.1)
Sodium: 140 mEq/L (ref 135–145)
Total Bilirubin: 0.5 mg/dL (ref 0.2–1.2)
Total Protein: 6.9 g/dL (ref 6.0–8.3)

## 2020-02-23 LAB — LIPID PANEL
Cholesterol: 123 mg/dL (ref 0–200)
HDL: 48.1 mg/dL (ref >39.00)
LDL Cholesterol: 43 mg/dL (ref 0–99)
NonHDL: 75.35
Total CHOL/HDL Ratio: 3
Triglycerides: 162 mg/dL — ABNORMAL HIGH (ref 0.0–149.0)
VLDL: 32.4 mg/dL (ref 0.0–40.0)

## 2020-02-23 LAB — PSA: PSA: 0.7 ng/mL (ref 0.10–4.00)

## 2020-02-23 LAB — HEMOGLOBIN A1C: Hgb A1c MFr Bld: 5.8 % (ref 4.6–6.5)

## 2020-02-23 MED ORDER — SILDENAFIL CITRATE 100 MG PO TABS: 50.0000 mg | ORAL_TABLET | Freq: Every day | ORAL | 0 refills | Status: DC | PRN

## 2020-02-23 NOTE — Progress Notes (Signed)
Jimmy Rumps, MD Phone: [phone]  MOROCCO GIPE is a 50 y.o. male who presents today for CPE.  Diet: Has been eating healthier since he saw his pulmonologist.  He stopped regular soda and switch to diet soda.  He is cut down on carbs.  He is down about 10 pounds. Exercise: Walks 3 to 4 miles each day on the weekend.  He is active working Architect during the week. Colonoscopy: Due Prostate cancer screening: Due Family history-  Prostate cancer: Father  Colon cancer: No Sexually active: With wife Vaccines-   Flu: Up-to-date  Tetanus: Reports he had this for years ago  COVID19: Up-to-date, declines booster vaccine HIV screening: Declines Hep C Screening: Up-to-date Tobacco use: Quit 4 years ago, smoked 2 packs a day for about 30 years. Alcohol use: 8 beers on Fridays, no other alcohol during the week Illicit Drug use: No Dentist: Yes Ophthalmology: No  Erectile dysfunction: This has been going on for about 6 months.  He has trouble maintaining an erection.  He is able to get to ejaculation though does take quite a bit of time.  No pain with erections.   Active Ambulatory Problems    Diagnosis Date Noted  . Lung nodules 02/19/2015  . COPD (chronic obstructive pulmonary disease) ([place]) 02/19/2015  . BPH (benign prostatic hyperplasia) 02/19/2015  . Liver lesion 02/19/2015  . Low back pain 03/22/2015  . Acid reflux 07/09/2015  . 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 Degeneration of intervertebral disc of cervical region 11/21/2013  . Cervical spinal stenosis 11/21/2013  . Back ache 08/31/2013  . Asthma without status asthmaticus 07/09/2015  . Cigarette smoker   . 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 Atherosclerosis of native coronary artery of native heart without angina pectoris 08/03/2015  . Anxiety and depression 10/23/2015  . Alcohol abuse 10/23/2015  . Decreased sex drive 01/08/7251  . Obesity (BMI 30.0-34.9)  06/17/2017  . Fatty liver 06/17/2017  . Hyperlipidemia 04/19/2018  . History of 2019 novel coronavirus disease (COVID-19) 08/20/2018  . Encounter for general adult medical examination with abnormal findings 02/23/2020  . 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 Tobacco abuse 02/19/2015  . Tennis elbow 06/22/2015  . Trapezius strain 06/22/2015  . Lung mass 08/20/2015  . Nodule of right lung   . Pleural effusion   . Pneumothorax, traumatic   . Centrilobular emphysema ([place])   . Abdominal pain, right upper quadrant 10/23/2015  . Rash and nonspecific skin eruption 01/10/2016   Past Medical History:  Diagnosis Date  . Asthma   . Chickenpox   . GERD (gastroesophageal reflux disease)   . Restless leg syndrome   . Shortness of breath dyspnea     Family History  Problem Relation Age of Onset  . Bladder Cancer Father   . Cancer Paternal Aunt        stomach  . Arthritis Other        Parent  . Hyperlipidemia Other        Parent  . Heart disease Other        Parent  . Hypertension Other        Parent  . Kidney disease Other        Parent    Social History   Socioeconomic History  . Marital status: Married    Spouse name: Not on file  . Number of children: Not on file  .  Years of education: Not on file  . Highest education level: Not on file  Occupational History  . Not on file  Tobacco Use  . Smoking status: Former Smoker    Packs/day: 0.50    Types: Cigarettes    Quit date: 07/07/2015    Years since quitting: 4.6  . Smokeless tobacco: Never Used  Substance and Sexual Activity  . Alcohol use: Yes    Alcohol/week: 0.0 standard drinks    Comment: 1 Case of Beer / Weekly  . Drug use: No  . Sexual activity: Not on file  Other Topics Concern  . Not on file  Social History Narrative  . Not on file   Social Determinants of Health   Financial Resource Strain: Not on file  Food Insecurity: Not on file   Transportation Needs: Not on file  Physical Activity: Not on file  Stress: Not on file  Social Connections: Not on file  Intimate Partner Violence: Not on file    ROS  General:  Negative for nexplained weight loss, fever Skin: Negative for new or changing mole, sore that won't heal HEENT: Negative for trouble hearing, trouble seeing, ringing in ears, mouth sores, hoarseness, change in voice, dysphagia. CV:  Negative for chest pain, dyspnea, edema, palpitations Resp: Negative for cough, dyspnea, hemoptysis GI: Negative for nausea, vomiting, diarrhea, constipation, abdominal pain, melena, hematochezia. GU: Positive for sexual difficulty, negative for dysuria, incontinence, urinary hesitance, hematuria, vaginal or penile discharge, polyuria, lumps in testicle or breasts MSK: Negative for muscle cramps or aches, joint pain or swelling Neuro: Negative for headaches, weakness, numbness, dizziness, passing out/fainting Psych: Negative for depression, anxiety, memory problems  Objective  Physical Exam Vitals:   02/23/20 1248  BP: 120/80  Pulse: 85  Temp: 98 F (36.7 C)  SpO2: 95%    BP Readings from Last 3 Encounters:  02/23/20 120/80  12/08/19 (!) 140/100  11/08/18 140/80   Wt Readings from Last 3 Encounters:  02/23/20 220 lb (99.8 kg)  12/08/19 223 lb 3.2 oz (101.2 kg)  11/08/18 214 lb 6.4 oz (97.3 kg)    Physical Exam Constitutional:      General: He is not in acute distress.    Appearance: He is not diaphoretic.  HENT:     Head: Normocephalic and atraumatic.  Eyes:     Conjunctiva/sclera: Conjunctivae normal.     Pupils: Pupils are equal, round, and reactive to light.  Cardiovascular:     Rate and Rhythm: Normal rate and regular rhythm.     Heart sounds: Normal heart sounds.  Pulmonary:     Effort: Pulmonary effort is normal.     Breath sounds: Normal breath sounds.  Abdominal:     General: Bowel sounds are normal. There is no distension.     Palpations:  Abdomen is soft.     Tenderness: There is no abdominal tenderness. There is no guarding or rebound.  Genitourinary:    Penis: Normal.      Testes: Normal.     Comments: Normal scrotum, no inguinal hernias Musculoskeletal:        General: No edema.     Right lower leg: No edema.     Left lower leg: No edema.  Lymphadenopathy:     Cervical: No cervical adenopathy.  Skin:    General: Skin is warm and dry.  Neurological:     Mental Status: He is alert.  Psychiatric:        Mood and Affect: Mood normal.  Assessment/Plan:   Problem List Items Addressed This Visit    Encounter for general adult medical examination with abnormal findings - Primary    Physical exam completed.  Encouraged him to cut out soda.  Encouraged continued dietary changes.  Encouraged increasing physical activity.  Refer to GI for colon cancer screening.  PSA today for prostate cancer screening.  We will get him referred for lung cancer screening given his history.  I encouraged decreased alcohol intake and advised that he take in no more than 3 beers at a time and that he should not drink any more per week than he already is.  Encouraged him to see an eye doctor.  Lab work as outlined.  I did encourage him to get the COVID booster if he changes his mind.  The patient also never had his third Twinrix vaccine.  This was brought up-to-date today.      Hyperlipidemia   Relevant Medications   sildenafil (VIAGRA) 100 MG tablet   Other Relevant Orders   Comp Met (CMET)   Lipid panel   Lung nodules    Prior smoking history with prior lung nodules indicates need for yearly screening test based on most recent USPSTF recommendations.      Obesity (BMI 30.0-34.9)   Relevant Orders   HgB A1c   Vasculogenic erectile dysfunction    Patient with erectile dysfunction.  Will trial Viagra 50-100 mg once daily as needed for erectile dysfunction.  Advised that if he ever develops chest pain or shortness of breath he needs to  discontinue intercourse and be evaluated.  Discussed if he ever had to contact EMS or go to the emergency room he would need to let them know that he had taken this within the last 48 hours.      Relevant Medications   sildenafil (VIAGRA) 100 MG tablet    Other Visit Diagnoses    Colon cancer screening       Relevant Orders   Ambulatory referral to Gastroenterology   Prostate cancer screening       Relevant Orders   PSA      This visit occurred during the SARS-CoV-2 public health emergency.  Safety protocols were in place, including screening questions prior to the visit, additional usage of staff PPE, and extensive cleaning of exam room while observing appropriate contact time as indicated for disinfecting solutions.    Jimmy Rumps, MD [place]

## 2020-02-23 NOTE — Patient Instructions (Signed)
Nice to see you. We will get labs today.  GI will contact you for your colonoscopy.  Please try to increase your exercise and continue to work on diet. Please try the Viagra.  If is not beneficial please let us know.

## 2020-02-23 NOTE — Assessment & Plan Note (Signed)
Prior smoking history with prior lung nodules indicates need for yearly screening test based on most recent USPSTF recommendations.

## 2020-02-23 NOTE — Assessment & Plan Note (Addendum)
Physical exam completed.  Encouraged him to cut out soda.  Encouraged continued dietary changes.  Encouraged increasing physical activity.  Refer to GI for colon cancer screening.  PSA today for prostate cancer screening.  We will get him referred for lung cancer screening given his history.  I encouraged decreased alcohol intake and advised that he take in no more than 3 beers at a time and that he should not drink any more per week than he already is.  Encouraged him to see an eye doctor.  Lab work as outlined.  I did encourage him to get the COVID booster if he changes his mind.  The patient also never had his third Twinrix vaccine.  This was brought up-to-date today.

## 2020-02-23 NOTE — Assessment & Plan Note (Signed)
Patient with erectile dysfunction.  Will trial Viagra 50-100 mg once daily as needed for erectile dysfunction.  Advised that if he ever develops chest pain or shortness of breath he needs to discontinue intercourse and be evaluated.  Discussed if he ever had to contact EMS or go to the emergency room he would need to let them know that he had taken this within the last 48 hours.

## 2020-03-02 ENCOUNTER — Encounter: Payer: Self-pay | Admitting: *Deleted

## 2020-03-05 ENCOUNTER — Other Ambulatory Visit: Payer: Self-pay | Admitting: Family Medicine

## 2020-03-05 ENCOUNTER — Telehealth: Payer: Self-pay | Admitting: *Deleted

## 2020-03-05 NOTE — Telephone Encounter (Signed)
Attempted to contact and schedule patient for lung screening scan. However there was no answer or voicemail option.

## 2020-03-14 ENCOUNTER — Telehealth: Payer: Self-pay | Admitting: *Deleted

## 2020-03-14 DIAGNOSIS — Z87891 Personal history of nicotine dependence: Secondary | ICD-10-CM

## 2020-03-14 DIAGNOSIS — Z122 Encounter for screening for malignant neoplasm of respiratory organs: Secondary | ICD-10-CM

## 2020-03-14 NOTE — Telephone Encounter (Signed)
Received referral for low dose lung cancer screening CT scan. Message left at phone number listed in EMR for patient to call me back to facilitate scheduling scan.  

## 2020-03-14 NOTE — Telephone Encounter (Signed)
Received referral for initial lung cancer screening scan. Contacted patient and obtained smoking history,(former, quit 2017, 60 pack year) as well as answering questions related to screening process. Patient denies signs of lung cancer such as weight loss or hemoptysis. Patient denies comorbidity that would prevent curative treatment if lung cancer were found. Patient is scheduled for shared decision making visit and CT scan on (date TBD r/t patient request).

## 2020-03-26 ENCOUNTER — Telehealth: Payer: Self-pay | Admitting: Family Medicine

## 2020-03-26 DIAGNOSIS — Z1211 Encounter for screening for malignant neoplasm of colon: Secondary | ICD-10-CM

## 2020-03-26 NOTE — Telephone Encounter (Signed)
I called and spoke with the patient. Patient does not have a family history of colon cancer, patient does not have any blood in his stool and patient has never had a colonoscopy.  Patient stated he will check with his insurance to see if they would pay, but he is pretty sure they do.  I explained to the patient that if the cologuard is positive he would have to have a colonoscopy and he understood.  Nina,cma

## 2020-03-26 NOTE — Telephone Encounter (Signed)
Pt called and wanted to discuss doing the cologuard and not having a colonoscopy.  Nina,cma

## 2020-03-26 NOTE — Telephone Encounter (Signed)
Pt called and wanted to discuss doing the cologuard and not having a colonoscopy

## 2020-03-26 NOTE — Telephone Encounter (Signed)
Please make sure the patient does not have a family history of colon cancer.  Has he ever had a colonoscopy?  Has he had any blood in his stool or per his rectum?  If the answer is no to these then he would certainly meet criteria.  If his Cologuard was positive he would have to have a colonoscopy.  There is the potential that his insurance would not pay for the follow-up colonoscopy as it would no longer be a screening test if his Cologuard was positive.  He may also want to check with his insurance to ensure that they cover Cologuard.

## 2020-03-27 NOTE — Telephone Encounter (Signed)
Cologuard ordered to go to the patient's home.  Nina,cma

## 2020-03-27 NOTE — Addendum Note (Signed)
Addended by: Glori Luis on: 03/27/2020 02:16 PM   Modules accepted: Orders

## 2020-03-27 NOTE — Telephone Encounter (Signed)
Cologuard ordered and signed.  Please fax.

## 2020-03-29 ENCOUNTER — Inpatient Hospital Stay: Payer: BC Managed Care – PPO | Admitting: Nurse Practitioner

## 2020-03-30 ENCOUNTER — Other Ambulatory Visit: Payer: Self-pay

## 2020-03-30 ENCOUNTER — Ambulatory Visit
Admission: RE | Admit: 2020-03-30 | Discharge: 2020-03-30 | Disposition: A | Discharge status: Home / Self Care | Payer: BC Managed Care – PPO | Source: Ambulatory Visit | Attending: Oncology | Admitting: Oncology

## 2020-03-30 DIAGNOSIS — Z87891 Personal history of nicotine dependence: Secondary | ICD-10-CM | POA: Diagnosis not present

## 2020-03-30 DIAGNOSIS — Z122 Encounter for screening for malignant neoplasm of respiratory organs: Secondary | ICD-10-CM | POA: Diagnosis not present

## 2020-04-02 ENCOUNTER — Other Ambulatory Visit: Payer: Self-pay | Admitting: Family Medicine

## 2020-04-02 DIAGNOSIS — E785 Hyperlipidemia, unspecified: Secondary | ICD-10-CM

## 2020-04-03 ENCOUNTER — Encounter: Payer: Self-pay | Admitting: *Deleted

## 2020-04-12 DIAGNOSIS — Z20822 Contact with and (suspected) exposure to covid-19: Secondary | ICD-10-CM | POA: Diagnosis not present

## 2020-04-29 DIAGNOSIS — Z1211 Encounter for screening for malignant neoplasm of colon: Secondary | ICD-10-CM | POA: Diagnosis not present

## 2020-05-03 ENCOUNTER — Other Ambulatory Visit: Payer: Self-pay

## 2020-05-03 ENCOUNTER — Other Ambulatory Visit: Payer: Self-pay | Admitting: Internal Medicine

## 2020-05-03 ENCOUNTER — Other Ambulatory Visit: Payer: Self-pay | Admitting: Family Medicine

## 2020-05-03 DIAGNOSIS — N529 Male erectile dysfunction, unspecified: Secondary | ICD-10-CM

## 2020-05-03 LAB — EXTERNAL GENERIC LAB PROCEDURE: COLOGUARD: NEGATIVE

## 2020-05-03 LAB — COLOGUARD
COLOGUARD: NEGATIVE
Cologuard: NEGATIVE

## 2020-05-03 MED FILL — Omeprazole Cap Delayed Release 20 MG: ORAL | 30 days supply | Qty: 30 | Fill #0 | Status: AC

## 2020-05-03 MED FILL — Albuterol Sulfate Inhal Aero 108 MCG/ACT (90MCG Base Equiv): RESPIRATORY_TRACT | 30 days supply | Qty: 18 | Fill #0 | Status: AC

## 2020-05-03 MED FILL — Umeclidinium-Vilanterol Aero Powd BA 62.5-25 MCG/ACT: RESPIRATORY_TRACT | 30 days supply | Qty: 60 | Fill #0 | Status: AC

## 2020-05-04 ENCOUNTER — Other Ambulatory Visit: Payer: Self-pay

## 2020-05-04 MED FILL — Sildenafil Citrate Tab 100 MG: ORAL | 30 days supply | Qty: 4 | Fill #0 | Status: AC

## 2020-05-04 MED FILL — Sildenafil Citrate Tab 100 MG: ORAL | 30 days supply | Qty: 6 | Fill #0 | Status: CN

## 2020-05-08 ENCOUNTER — Encounter: Payer: Self-pay | Admitting: Family Medicine

## 2020-05-08 ENCOUNTER — Telehealth: Payer: Self-pay

## 2020-05-08 NOTE — Telephone Encounter (Signed)
Noted.  Please inform him his Cologuard was negative.  Thanks.

## 2020-05-08 NOTE — Telephone Encounter (Signed)
I called the patient and informed him that his cologuard was negative and he understood.  Skyleigh Windle,cma

## 2020-05-18 NOTE — Addendum Note (Signed)
Addended by: Charlyne Mom D on: 05/18/2020 01:32 PM   Modules accepted: Orders

## 2020-05-25 ENCOUNTER — Ambulatory Visit: Payer: BC Managed Care – PPO | Admitting: Family Medicine

## 2020-05-25 ENCOUNTER — Other Ambulatory Visit: Payer: Self-pay

## 2020-05-30 ENCOUNTER — Other Ambulatory Visit: Payer: Self-pay

## 2020-05-30 MED FILL — Albuterol Sulfate Inhal Aero 108 MCG/ACT (90MCG Base Equiv): RESPIRATORY_TRACT | 30 days supply | Qty: 18 | Fill #1 | Status: AC

## 2020-05-30 MED FILL — Sildenafil Citrate Tab 100 MG: ORAL | 30 days supply | Qty: 6 | Fill #1 | Status: AC

## 2020-05-30 MED FILL — Umeclidinium-Vilanterol Aero Powd BA 62.5-25 MCG/ACT: RESPIRATORY_TRACT | 30 days supply | Qty: 60 | Fill #1 | Status: AC

## 2020-06-01 ENCOUNTER — Other Ambulatory Visit: Payer: Self-pay

## 2020-06-01 ENCOUNTER — Ambulatory Visit (INDEPENDENT_AMBULATORY_CARE_PROVIDER_SITE_OTHER): Payer: BC Managed Care – PPO | Admitting: Family Medicine

## 2020-06-01 ENCOUNTER — Encounter: Payer: Self-pay | Admitting: Family Medicine

## 2020-06-01 DIAGNOSIS — F1010 Alcohol abuse, uncomplicated: Secondary | ICD-10-CM

## 2020-06-01 DIAGNOSIS — J449 Chronic obstructive pulmonary disease, unspecified: Secondary | ICD-10-CM

## 2020-06-01 DIAGNOSIS — E669 Obesity, unspecified: Secondary | ICD-10-CM

## 2020-06-01 DIAGNOSIS — E785 Hyperlipidemia, unspecified: Secondary | ICD-10-CM | POA: Diagnosis not present

## 2020-06-01 DIAGNOSIS — K219 Gastro-esophageal reflux disease without esophagitis: Secondary | ICD-10-CM | POA: Diagnosis not present

## 2020-06-01 NOTE — Assessment & Plan Note (Signed)
The patient will continue Crestor 20 mg once daily. °

## 2020-06-01 NOTE — Assessment & Plan Note (Signed)
Very well controlled.  He will continue Anoro.  I advised that he should not use the albuterol if he needs it for symptoms.

## 2020-06-01 NOTE — Progress Notes (Signed)
Marikay Alar, MD Phone: [phone]  Jimmy Howell is a 50 y.o. male who presents today for f/u.  GERD: History of reflux for 20+ years.  Reflux symptoms: none as long as he is on medication   Abd pain: no   Blood in stool: no  Dysphagia: no   EGD: no  Medication: taking nexium  COPD: Medication compliance- anoro  Rescue inhaler use- uses 2 puffs each morning despite lack of symptoms Dyspnea- no  Wheezing- no  Cough- no   HYPERLIPIDEMIA Symptoms Chest pain on exertion:  no   Leg claudication:   no Medications: Compliance- taking crestor Right upper quadrant pain- no  Muscle aches- no  Alcohol intake: Patient has cut back.  No longer drinking liquor.  Now drinking 15 beers per week.  Cutting back on beer as well.  He has noted some weight loss with this.    Social History   Tobacco Use  Smoking Status Former Smoker  . Packs/day: 2.00  . Years: 30.00  . Pack years: 60.00  . Types: Cigarettes  . Quit date: 07/07/2015  . Years since quitting: 4.9  Smokeless Tobacco Never Used    Current Outpatient Medications on File Prior to Visit  Medication Sig Dispense Refill  . albuterol (VENTOLIN HFA) 108 (90 Base) MCG/ACT inhaler INHALE 2 PUFFS INTO THE LUNGS EVERY 4 HOURS AS NEEDED FOR WHEEZING OR SHORTNESS OF BREATH. 18 g 3  . esomeprazole (NEXIUM) 20 MG capsule TAKE 1 CAPSULE BY MOUTH EVERY DAY BEFORE BREAKFAST 30 capsule 0  . rosuvastatin (CRESTOR) 20 MG tablet TAKE 1 TABLET BY MOUTH ONCE DAILY 90 tablet 0  . sildenafil (VIAGRA) 100 MG tablet TAKE 1/2-1 TABLETS (50-100 MG TOTAL) BY MOUTH DAILY AS NEEDED FOR ERECTILE DYSFUNCTION. 10 tablet 0  . umeclidinium-vilanterol (ANORO ELLIPTA) 62.5-25 MCG/INH AEPB INHALE 1 PUFF BY MOUTH DAILY. 60 each 5   Current Facility-Administered Medications on File Prior to Visit  Medication Dose Route Frequency Provider Last Rate Last Admin  . ipratropium-albuterol (DUONEB) 0.5-2.5 (3) MG/3ML nebulizer solution 3 mL  3 mL Nebulization Q6H  Allegra Grana, FNP   3 mL at 01/01/18 1024     ROS see history of present illness  Objective  Physical Exam Vitals:   06/01/20 0926  BP: 120/82  Pulse: 87  Temp: 98 F (36.7 C)  SpO2: 96%    BP Readings from Last 3 Encounters:  06/01/20 120/82  02/23/20 120/80  12/08/19 (!) 140/100   Wt Readings from Last 3 Encounters:  06/01/20 221 lb 9.6 oz (100.5 kg)  03/30/20 215 lb (97.5 kg)  02/23/20 220 lb (99.8 kg)    Physical Exam Constitutional:      General: He is not in acute distress.    Appearance: He is not diaphoretic.  Cardiovascular:     Rate and Rhythm: Normal rate and regular rhythm.     Heart sounds: Normal heart sounds.  Pulmonary:     Effort: Pulmonary effort is normal.     Breath sounds: Normal breath sounds.  Abdominal:     General: Bowel sounds are normal. There is no distension.     Palpations: Abdomen is soft.     Tenderness: There is no abdominal tenderness. There is no guarding or rebound.  Musculoskeletal:     Right lower leg: No edema.     Left lower leg: No edema.  Skin:    General: Skin is warm and dry.  Neurological:     Mental Status: He is  alert.      Assessment/Plan: Please see individual problem list.  Problem List Items Addressed This Visit    COPD (chronic obstructive pulmonary disease) ([facility])    Very well controlled.  He will continue Anoro.  I advised that he should not use the albuterol if he needs it for symptoms.      Acid reflux    Chronic issue.  We will refer to GI to consider EGD.  Continue Nexium 20 milligrams once daily.      Relevant Orders   Ambulatory referral to Gastroenterology   Alcohol abuse    The patient has cut down quite a bit.  I encouraged him to continue to progressively cut down on his alcohol intake.  Discussed that there is a risk of withdrawal and death with sudden discontinuation of alcohol intake.      Obesity (BMI 30.0-34.9)    The patient reports his weight has trended down at home  increasing his alcohol intake.      Hyperlipidemia    The patient will continue Crestor 20 mg once daily.         Return in about 3 months (around 09/01/2020) for Weight.  This visit occurred during the SARS-CoV-2 public health emergency.  Safety protocols were in place, including screening questions prior to the visit, additional usage of staff PPE, and extensive cleaning of exam room while observing appropriate contact time as indicated for disinfecting solutions.    Marikay Alar, MD [facility]

## 2020-06-01 NOTE — Assessment & Plan Note (Signed)
The patient has cut down quite a bit.  I encouraged him to continue to progressively cut down on his alcohol intake.  Discussed that there is a risk of withdrawal and death with sudden discontinuation of alcohol intake.

## 2020-06-01 NOTE — Patient Instructions (Signed)
Nice to see you. Please continue to progressively cut down on alcohol intake. Please only use the albuterol for symptoms. We will to see GI to consider endoscopy.

## 2020-06-01 NOTE — Assessment & Plan Note (Signed)
The patient reports his weight has trended down at home increasing his alcohol intake.

## 2020-06-01 NOTE — Assessment & Plan Note (Signed)
Chronic issue.  We will refer to GI to consider EGD.  Continue Nexium 20 milligrams once daily.

## 2020-06-13 IMAGING — US US ABDOMEN LIMITED
1 series · 14 of 25 positions shown · non-contrast
Comparison: 10/30/2015

CLINICAL DATA: Elevated liver enzymes

EXAM:
ULTRASOUND ABDOMEN LIMITED RIGHT UPPER QUADRANT

[Series 1: us abdomen limited · 0.20mm/px · 14 of 61 slices shown]
[im 1/61]
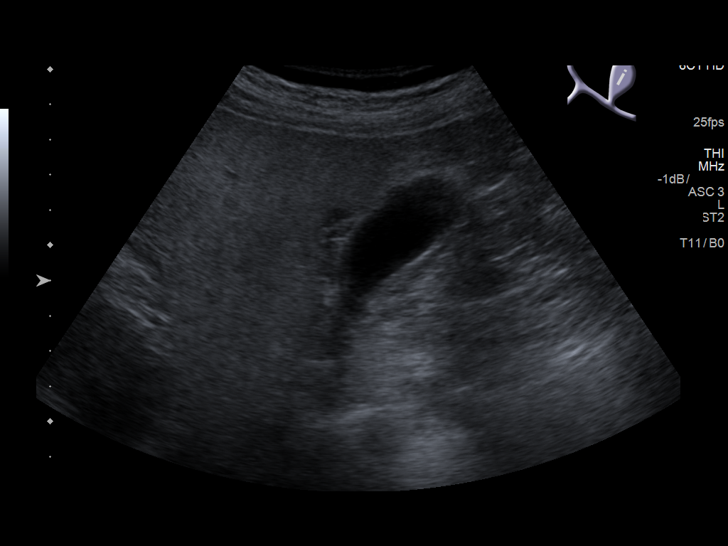
[im 6/61]
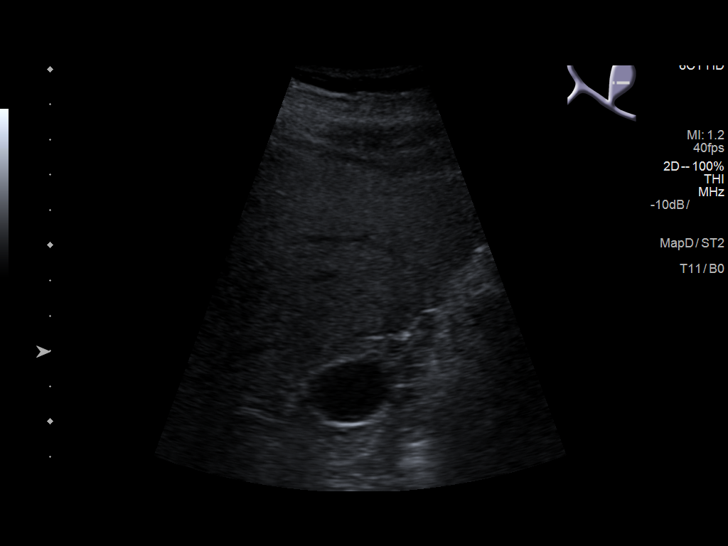
[im 11/61]
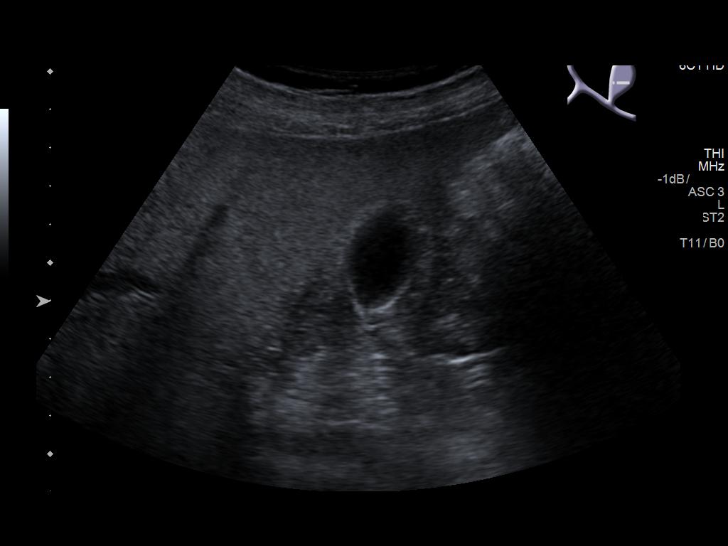
[im 16/61]
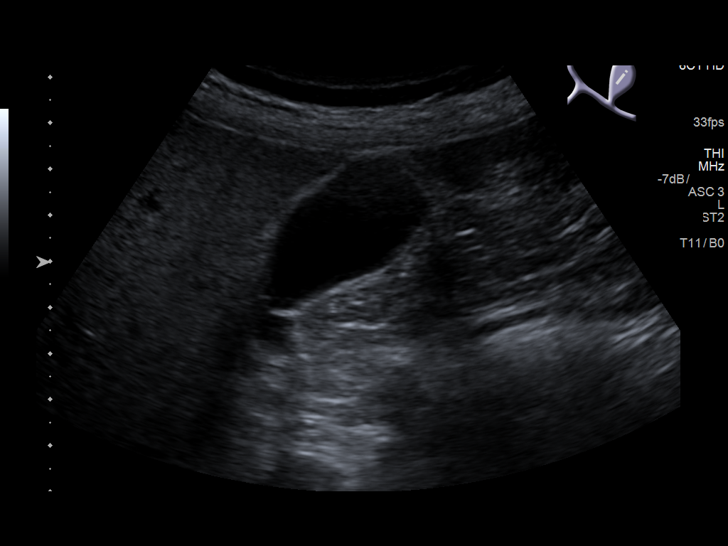
[im 21/61]
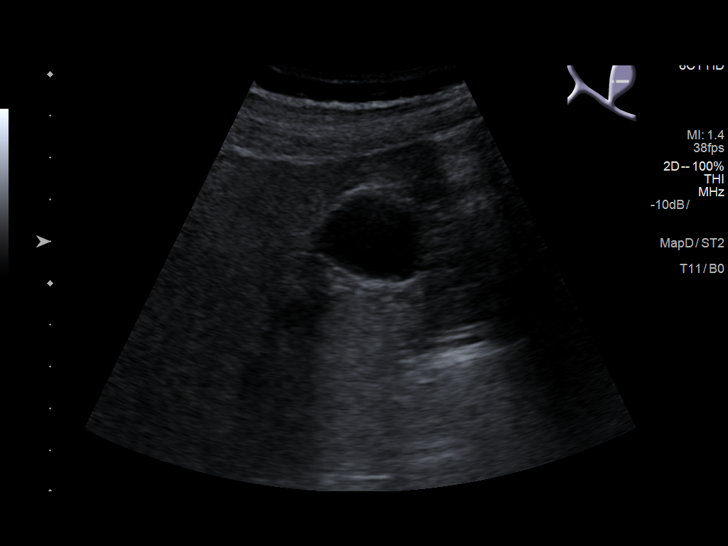
[im 23/61]
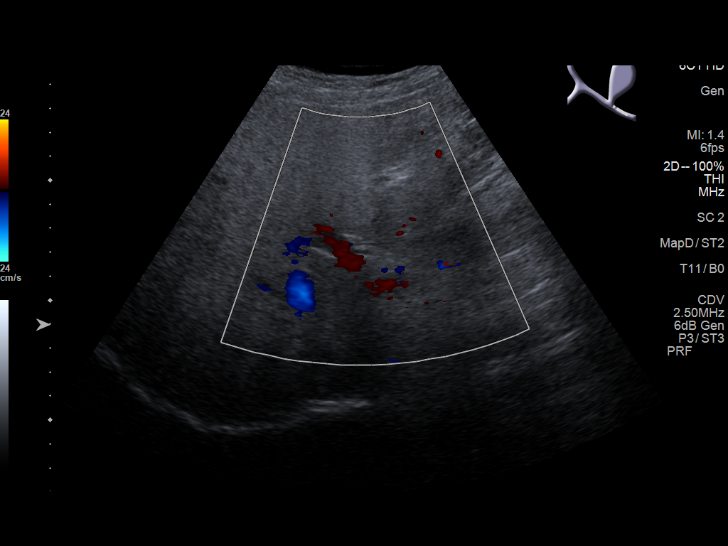
[im 28/61]
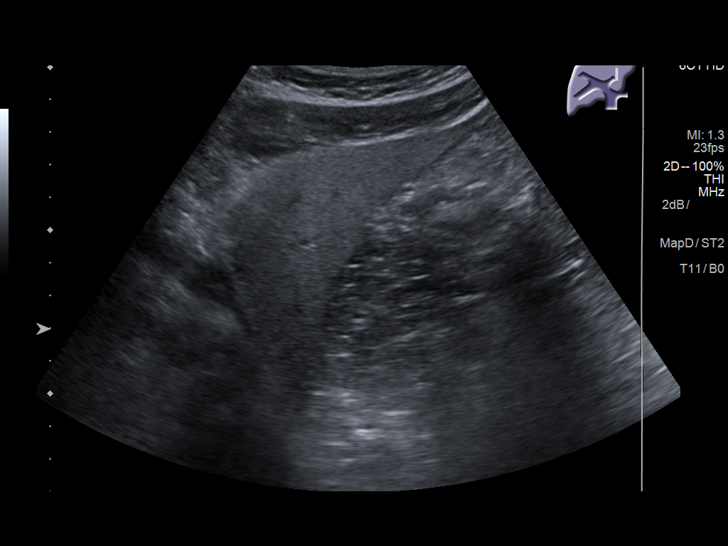
[im 33/61]
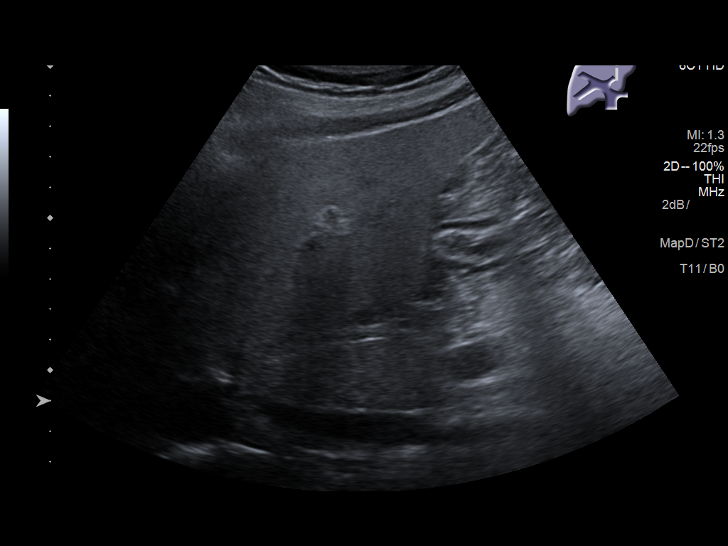
[im 38/61]
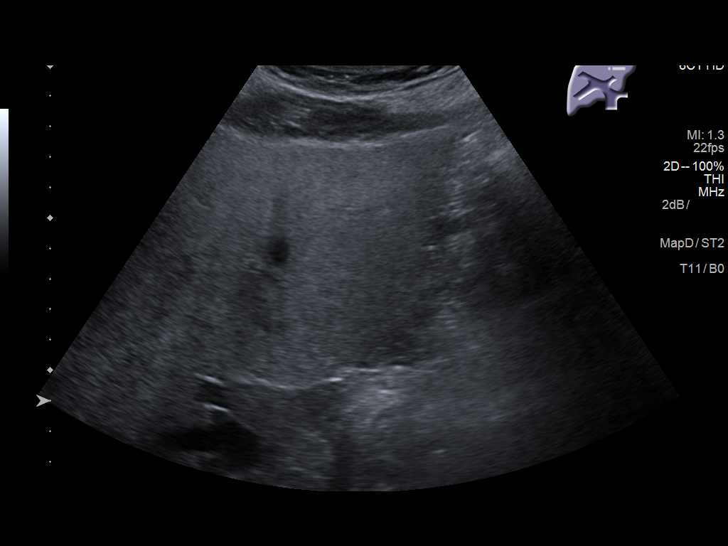
[im 41/61]
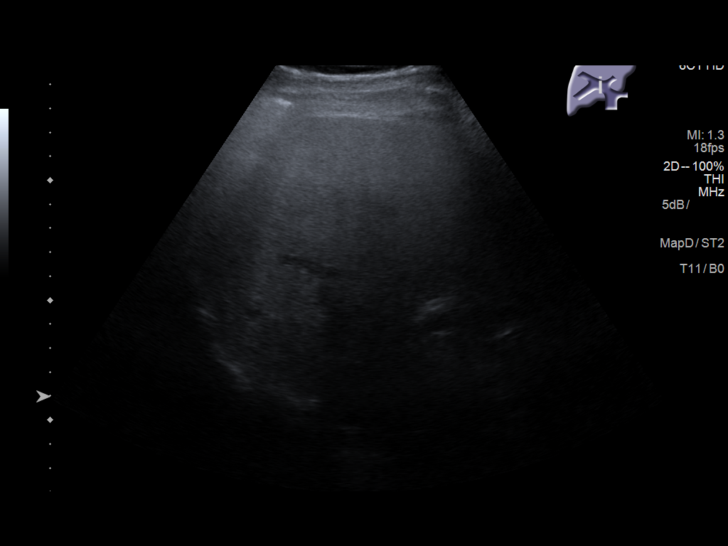
[im 46/61]
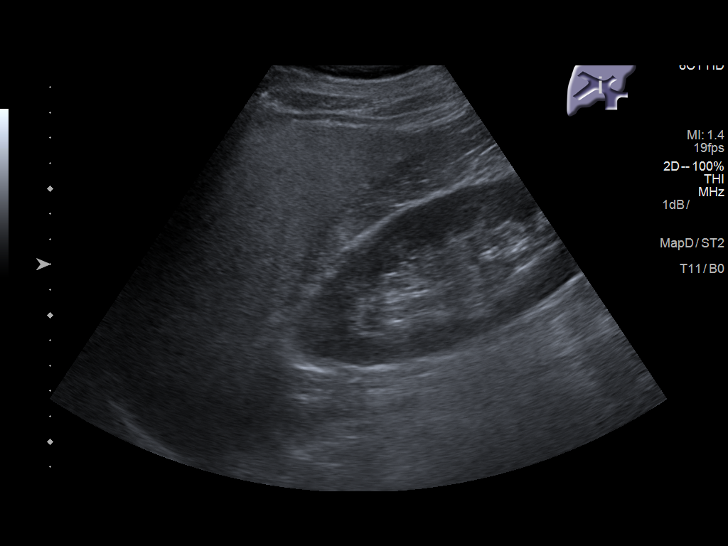
[im 51/61]
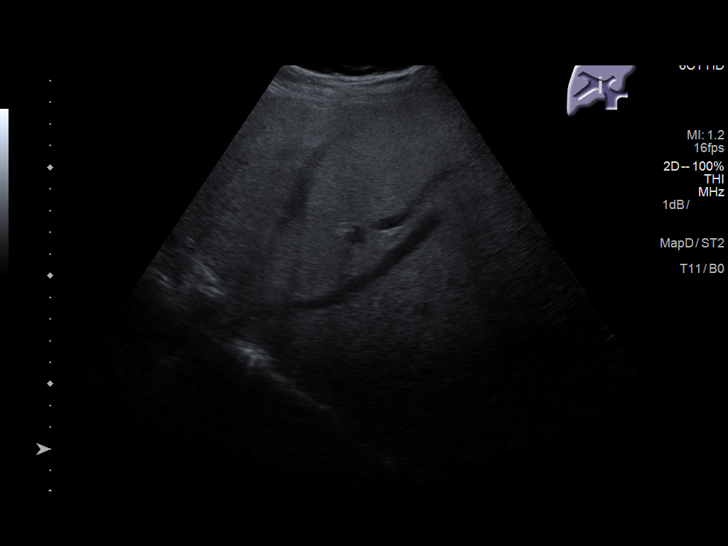
[im 56/61]
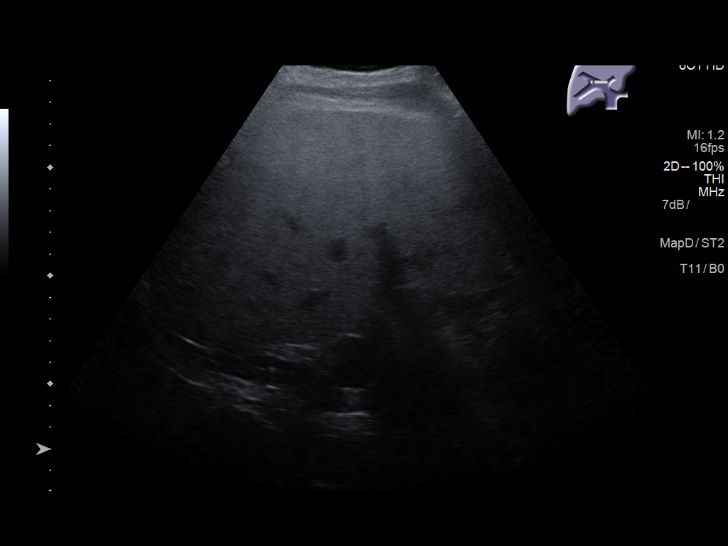
[im 61/61]
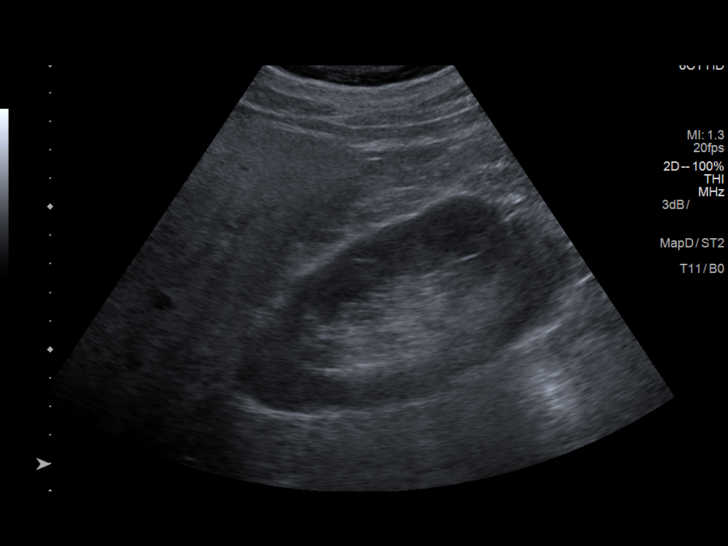

[14 of 25 positions shown; findings below may reference images not displayed]

FINDINGS: Gallbladder:

No gallstones or wall thickening visualized. No sonographic Murphy
sign noted by sonographer.

Common bile duct:

Diameter: 3 mm.  Normal.

Liver:

Increased echogenicity consistent with fatty change. Focal sparing
adjacent to the gallbladder fossa. No ductal dilatation. Portal vein
is patent on color Doppler imaging with normal direction of blood
flow towards the liver.
IMPRESSION: Diffusely echogenic liver consistent with steatosis. Small area
focal sparing adjacent to the gallbladder fossa, frequently seen. No
evidence of gallstones or ductal dilatation.

## 2020-06-26 ENCOUNTER — Other Ambulatory Visit: Payer: Self-pay

## 2020-06-26 ENCOUNTER — Other Ambulatory Visit: Payer: Self-pay | Admitting: Family Medicine

## 2020-06-26 DIAGNOSIS — E785 Hyperlipidemia, unspecified: Secondary | ICD-10-CM

## 2020-06-26 MED FILL — Rosuvastatin Calcium Tab 20 MG: ORAL | 90 days supply | Qty: 90 | Fill #0 | Status: AC

## 2020-06-26 MED FILL — Umeclidinium-Vilanterol Aero Powd BA 62.5-25 MCG/ACT: RESPIRATORY_TRACT | 30 days supply | Qty: 60 | Fill #2 | Status: AC

## 2020-06-26 MED FILL — Omeprazole Cap Delayed Release 20 MG: ORAL | 30 days supply | Qty: 30 | Fill #1 | Status: CN

## 2020-06-26 MED FILL — Albuterol Sulfate Inhal Aero 108 MCG/ACT (90MCG Base Equiv): RESPIRATORY_TRACT | 30 days supply | Qty: 18 | Fill #2 | Status: AC

## 2020-06-27 ENCOUNTER — Other Ambulatory Visit: Payer: Self-pay

## 2020-07-13 ENCOUNTER — Other Ambulatory Visit: Payer: Self-pay | Admitting: Family Medicine

## 2020-07-13 ENCOUNTER — Other Ambulatory Visit: Payer: Self-pay

## 2020-07-13 DIAGNOSIS — N529 Male erectile dysfunction, unspecified: Secondary | ICD-10-CM

## 2020-07-13 MED FILL — Sildenafil Citrate Tab 100 MG: ORAL | 30 days supply | Qty: 4 | Fill #0 | Status: AC

## 2020-07-30 ENCOUNTER — Other Ambulatory Visit: Payer: Self-pay

## 2020-07-30 MED FILL — Umeclidinium-Vilanterol Aero Powd BA 62.5-25 MCG/ACT: RESPIRATORY_TRACT | 30 days supply | Qty: 60 | Fill #3 | Status: AC

## 2020-07-30 MED FILL — Albuterol Sulfate Inhal Aero 108 MCG/ACT (90MCG Base Equiv): RESPIRATORY_TRACT | 30 days supply | Qty: 18 | Fill #3 | Status: AC

## 2020-08-01 ENCOUNTER — Other Ambulatory Visit: Payer: Self-pay

## 2020-08-01 ENCOUNTER — Ambulatory Visit: Payer: BC Managed Care – PPO | Admitting: Gastroenterology

## 2020-08-01 ENCOUNTER — Encounter: Payer: Self-pay | Admitting: Gastroenterology

## 2020-08-01 VITALS — BP 139/88 | HR 74 | Temp 98.2°F | Ht 69.0 in | Wt 224.8 lb

## 2020-08-01 DIAGNOSIS — K219 Gastro-esophageal reflux disease without esophagitis: Secondary | ICD-10-CM

## 2020-08-01 MED ORDER — OMEPRAZOLE 20 MG PO CPDR
DELAYED_RELEASE_CAPSULE | Freq: Every day | ORAL | 3 refills | Status: DC
  Filled 2020-08-01: qty 30, fill #0

## 2020-08-01 MED ORDER — OMEPRAZOLE 20 MG PO CPDR
DELAYED_RELEASE_CAPSULE | Freq: Every day | ORAL | 0 refills | Status: DC
  Filled 2020-08-01: qty 30, 30d supply, fill #0
  Filled 2020-08-14: qty 90, 90d supply, fill #0

## 2020-08-01 NOTE — Patient Instructions (Signed)

## 2020-08-01 NOTE — Progress Notes (Signed)
Wyline Mood MD, MRCP(U.K) [address]  [address]  [place], [place] [address]  Main: [phone]  Fax: [phone]   Primary Care Physician: Glori Luis, MD  Primary Gastroenterologist:  Dr. Wyline Mood   Chief Complaint  Patient presents with   Gastroesophageal Reflux    HPI: Jimmy Howell is a 50 y.o. male  Summary of history :     He was initially referred and seen on 08/24/17 for abnormal LFT's. Consumes large qty of alcohol every Friday , quit smoking cigarettes, tried cocaine many years back. No OTC meds. No tattoos. Gained 50 lbs recently. First noted elevated LFT's in 06/2017 .CT Chest in 04/2017 showed hepatic steatosis.   08/31/17: RUQ USG: Hepatic steatosis     Interval history    10/06/2017-08/01/2020   He has gained 18 pounds since his last visit with me back in 2019 Still drinks alcohol a sixpack on a Friday.  History of reflux and heartburn when he eats certain foods.  Going on for more than 20 years.  Worse when he lays flat.  Denies any dysphagia or weight loss.  When he takes Prilosec 20 mg first thing in the morning which he has for many years has absolutely no symptoms.  No prior endoscopy to evaluate.  Current Outpatient Medications  Medication Sig Dispense Refill   albuterol (VENTOLIN HFA) 108 (90 Base) MCG/ACT inhaler INHALE 2 PUFFS INTO THE LUNGS EVERY 4 HOURS AS NEEDED FOR WHEEZING OR SHORTNESS OF BREATH. 18 g 3   esomeprazole (NEXIUM) 20 MG capsule TAKE 1 CAPSULE BY MOUTH EVERY DAY BEFORE BREAKFAST 30 capsule 0   omeprazole (PRILOSEC) 20 MG capsule TAKE 1 CAPSULE BY MOUTH DAILY (Patient not taking: Reported on 06/01/2020) 30 capsule 3   rosuvastatin (CRESTOR) 20 MG tablet TAKE 1 TABLET BY MOUTH ONCE DAILY 90 tablet 0   sildenafil (VIAGRA) 100 MG tablet TAKE 1/2-1 TABLETS (50-100 MG TOTAL) BY MOUTH DAILY AS NEEDED FOR ERECTILE DYSFUNCTION. 10 tablet 0   umeclidinium-vilanterol (ANORO ELLIPTA) 62.5-25 MCG/INH AEPB INHALE 1 PUFF BY MOUTH DAILY.  60 each 5   Current Facility-Administered Medications  Medication Dose Route Frequency Provider Last Rate Last Admin   ipratropium-albuterol (DUONEB) 0.5-2.5 (3) MG/3ML nebulizer solution 3 mL  3 mL Nebulization Q6H Allegra Grana, FNP   3 mL at 01/01/18 1024    Allergies as of 08/01/2020   (No Active Allergies)    ROS:  General: Negative for anorexia, weight loss, fever, chills, fatigue, weakness. ENT: Negative for hoarseness, difficulty swallowing , nasal congestion. CV: Negative for chest pain, angina, palpitations, dyspnea on exertion, peripheral edema.  Respiratory: Negative for dyspnea at rest, dyspnea on exertion, cough, sputum, wheezing.  GI: See history of present illness. GU:  Negative for dysuria, hematuria, urinary incontinence, urinary frequency, nocturnal urination.  Endo: Negative for unusual weight change.    Physical Examination:   BP 139/88   Pulse 74   Temp 98.2 F (36.8 C) (Oral)   Ht 5\' 9"  (1.753 m)   Wt 224 lb 12.8 oz (102 kg)   BMI 33.20 kg/m   General: Well-nourished, well-developed in no acute distress.  Eyes: No icterus. Conjunctivae pink. Mouth: Oropharyngeal mucosa moist and pink , no lesions erythema or exudate. Lungs: Clear to auscultation bilaterally. Non-labored. Heart: Regular rate and rhythm, no murmurs rubs or gallops.  Abdomen: Bowel sounds are normal, nontender, nondistended, no hepatosplenomegaly or masses, no abdominal bruits or hernia , no rebound or guarding.   Extremities:  No lower extremity edema. No clubbing or deformities. Neuro: Alert and oriented x 3.  Grossly intact. Skin: Warm and dry, no jaundice.   Psych: Alert and cooperative, normal mood and affect.   Imaging Studies: No results found.  Assessment and Plan:   Jimmy Howell is a 50 y.o. 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  Discussed about screening for Barrett's esophagus and he was willing to proceed with an upper endoscopy  Plan  GERD : Counseled on life style changes, suggest to use PPI first thing in the morning on empty stomach and eat 30 minutes after. Advised on the use of a wedge pillow at night , avoid meals for 2 hours prior to bed time. Weight loss .Discussed the risks and benefits of long term PPI use including but not limited to bone loss, chronic kidney disease, infections , low magnesium . Aim to use at the lowest dose for the shortest period of time  Prilosec 20 mg a day, 90-day prescription with 3 refills provided.  I explained to him that once he loses weight and his abdominal obesity improves then we can try to get him off the Prilosec but short of that he would need to take it in the foreseeable future otherwise he will be symptomatic  I have discussed alternative options, risks & benefits,  which include, but are not limited to, bleeding, infection, perforation,respiratory complication & drug reaction.  The patient agrees with this plan & written consent will be obtained.    Dr Wyline Mood  MD,MRCP [facility]) Follow up in as needed

## 2020-08-10 ENCOUNTER — Ambulatory Visit: Payer: BC Managed Care – PPO | Admitting: Certified Registered"

## 2020-08-10 ENCOUNTER — Ambulatory Visit
Admission: RE | Admit: 2020-08-10 | Discharge: 2020-08-10 | Disposition: A | Discharge status: Home / Self Care | Payer: BC Managed Care – PPO | Attending: Gastroenterology | Admitting: Gastroenterology

## 2020-08-10 ENCOUNTER — Encounter
Admission: RE | Disposition: A | Discharge status: Home / Self Care | Payer: Self-pay | Source: Home / Self Care | Attending: Gastroenterology

## 2020-08-10 ENCOUNTER — Encounter: Payer: Self-pay | Admitting: Gastroenterology

## 2020-08-10 ENCOUNTER — Other Ambulatory Visit: Payer: Self-pay

## 2020-08-10 DIAGNOSIS — K219 Gastro-esophageal reflux disease without esophagitis: Secondary | ICD-10-CM | POA: Diagnosis not present

## 2020-08-10 DIAGNOSIS — Z800 Family history of malignant neoplasm of digestive organs: Secondary | ICD-10-CM | POA: Insufficient documentation

## 2020-08-10 DIAGNOSIS — Z87891 Personal history of nicotine dependence: Secondary | ICD-10-CM | POA: Insufficient documentation

## 2020-08-10 DIAGNOSIS — K319 Disease of stomach and duodenum, unspecified: Secondary | ICD-10-CM | POA: Insufficient documentation

## 2020-08-10 DIAGNOSIS — K2970 Gastritis, unspecified, without bleeding: Secondary | ICD-10-CM | POA: Diagnosis not present

## 2020-08-10 DIAGNOSIS — Z79899 Other long term (current) drug therapy: Secondary | ICD-10-CM | POA: Insufficient documentation

## 2020-08-10 DIAGNOSIS — Z13810 Encounter for screening for upper gastrointestinal disorder: Secondary | ICD-10-CM | POA: Diagnosis not present

## 2020-08-10 DIAGNOSIS — K2950 Unspecified chronic gastritis without bleeding: Secondary | ICD-10-CM | POA: Diagnosis not present

## 2020-08-10 HISTORY — PX: ESOPHAGOGASTRODUODENOSCOPY (EGD) WITH PROPOFOL: SHX5813

## 2020-08-10 SURGERY — ESOPHAGOGASTRODUODENOSCOPY (EGD) WITH PROPOFOL
Anesthesia: General

## 2020-08-10 MED ORDER — GLYCOPYRROLATE 0.2 MG/ML IJ SOLN
INTRAMUSCULAR | Status: DC | PRN
  Administered 2020-08-10: .2 mg via INTRAVENOUS

## 2020-08-10 MED ORDER — LIDOCAINE 2% (20 MG/ML) 5 ML SYRINGE
INTRAMUSCULAR | Status: DC | PRN
  Administered 2020-08-10: 25 mg via INTRAVENOUS

## 2020-08-10 MED ORDER — PROPOFOL 500 MG/50ML IV EMUL
INTRAVENOUS | Status: AC
  Filled 2020-08-10: qty 50

## 2020-08-10 MED ORDER — MIDAZOLAM HCL 5 MG/5ML IJ SOLN
INTRAMUSCULAR | Status: DC | PRN
  Administered 2020-08-10: 2 mg via INTRAVENOUS

## 2020-08-10 MED ORDER — LIDOCAINE HCL (PF) 2 % IJ SOLN
INTRAMUSCULAR | Status: AC
  Filled 2020-08-10: qty 5

## 2020-08-10 MED ORDER — PROPOFOL 10 MG/ML IV BOLUS
INTRAVENOUS | Status: DC | PRN
  Administered 2020-08-10: 100 mg via INTRAVENOUS

## 2020-08-10 MED ORDER — SODIUM CHLORIDE 0.9 % IV SOLN: INTRAVENOUS | Status: DC

## 2020-08-10 MED ORDER — MIDAZOLAM HCL 2 MG/2ML IJ SOLN
INTRAMUSCULAR | Status: AC
  Filled 2020-08-10: qty 2

## 2020-08-10 MED ORDER — METOPROLOL TARTRATE 5 MG/5ML IV SOLN
INTRAVENOUS | Status: AC
  Filled 2020-08-10: qty 5

## 2020-08-10 MED ORDER — PROPOFOL 500 MG/50ML IV EMUL
INTRAVENOUS | Status: DC | PRN
  Administered 2020-08-10: 120 ug/kg/min via INTRAVENOUS

## 2020-08-10 NOTE — Anesthesia Preprocedure Evaluation (Signed)
Anesthesia Evaluation  Patient identified by MRN, date of birth, ID band Patient awake    Reviewed: Allergy & Precautions, H&P , NPO status , Patient's Chart, lab work & pertinent test results, reviewed documented beta blocker date and time   History of Anesthesia Complications (+) Family history of anesthesia reaction and history of anesthetic complications  Airway Mallampati: II  TM Distance: >3 FB Neck ROM: full    Dental  (+) Missing, Chipped   Pulmonary shortness of breath and with exertion, asthma , neg sleep apnea, COPD,  COPD inhaler, neg recent URI, former smoker,    Pulmonary exam normal breath sounds clear to auscultation       Cardiovascular Exercise Tolerance: Good negative cardio ROS Normal cardiovascular exam Rhythm:regular Rate:Normal     Neuro/Psych PSYCHIATRIC DISORDERS Anxiety Depression negative neurological ROS     GI/Hepatic Neg liver ROS, GERD  ,  Endo/Other  negative endocrine ROS  Renal/GU negative Renal ROS  negative genitourinary   Musculoskeletal   Abdominal   Peds  Hematology negative hematology ROS (+)   Anesthesia Other Findings Past Medical History: No date: Asthma No date: Chickenpox No date: COPD (chronic obstructive pulmonary disease) (* No date: GERD (gastroesophageal reflux disease) No date: Restless leg syndrome No date: Shortness of breath dyspnea     Comment: with exertion   Reproductive/Obstetrics negative OB ROS                             Anesthesia Physical  Anesthesia Plan  ASA: II  Anesthesia Plan: General   Post-op Pain Management:    Induction: Intravenous  PONV Risk Score and Plan: 2 and Ondansetron, Propofol infusion and TIVA  Airway Management Planned: Nasal Cannula  Additional Equipment: None  Intra-op Plan:   Post-operative Plan:   Informed Consent: I have reviewed the patients History and Physical, chart, labs  and discussed the procedure including the risks, benefits and alternatives for the proposed anesthesia with the patient or authorized representative who has indicated his/her understanding and acceptance.     Dental Advisory Given  Plan Discussed with: Anesthesiologist, CRNA and Surgeon  Anesthesia Plan Comments: (Discussed risks of anesthesia with patient, including possibility of difficulty with spontaneous ventilation under anesthesia necessitating airway intervention, PONV, and rare risks such as cardiac or respiratory or neurological events, and allergic reactions. Patient understands.)        Anesthesia Quick Evaluation

## 2020-08-10 NOTE — Anesthesia Postprocedure Evaluation (Signed)
Anesthesia Post Note  Patient: Jimmy Howell  Procedure(s) Performed: ESOPHAGOGASTRODUODENOSCOPY (EGD) WITH PROPOFOL  Patient location during evaluation: Endoscopy Anesthesia Type: General Level of consciousness: awake and alert Pain management: pain level controlled Vital Signs Assessment: post-procedure vital signs reviewed and stable Respiratory status: spontaneous breathing, nonlabored ventilation, respiratory function stable and patient connected to nasal cannula oxygen Cardiovascular status: blood pressure returned to baseline and stable Postop Assessment: no apparent nausea or vomiting Anesthetic complications: no   No notable events documented.   Last Vitals:  Vitals:   08/10/20 0959 08/10/20 1001  BP:  (!) 133/99  Pulse: 81 79  Resp: 17   Temp:    SpO2: 96% 96%    Last Pain:  Vitals:   08/10/20 0959  TempSrc:   PainSc: 0-No pain                 Corinda Gubler

## 2020-08-10 NOTE — Transfer of Care (Signed)
Immediate Anesthesia Transfer of Care Note  Patient: Jimmy Howell  Procedure(s) Performed: ESOPHAGOGASTRODUODENOSCOPY (EGD) WITH PROPOFOL  Patient Location: Endoscopy Unit  Anesthesia Type:General  Level of Consciousness: awake and alert   Airway & Oxygen Therapy: Patient Spontanous Breathing  Post-op Assessment: Report given to RN and Post -op Vital signs reviewed and stable  Post vital signs: Reviewed  Last Vitals:  Vitals Value Taken Time  BP 125/86 08/10/20 0939  Temp 36.4 C 08/10/20 0939  Pulse 82 08/10/20 0940  Resp 15 08/10/20 0940  SpO2 97 % 08/10/20 0940  Vitals shown include unvalidated device data.  Last Pain:  Vitals:   08/10/20 0939  TempSrc: Temporal  PainSc: 0-No pain         Complications: No notable events documented.

## 2020-08-10 NOTE — H&P (Signed)
Wyline Mood, MD [address], [address], [place], [place], [address] [address], [place], [place], [address] Phone: [phone]  Fax: [phone]  Primary Care Physician:  Glori Luis, MD   Pre-Procedure History & Physical: HPI:  Jimmy Howell is a 49 y.o. 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 Patient like assistance and wants to use Wellbutrin. We'll start on Wellbutrin 150 mg daily for 3 days and then increase to 150 mg twice a day.    Past Surgical History:  Procedure Laterality Date   FINGER SURGERY     HERNIA REPAIR     Inguinal Hernia Repair   VIDEO ASSISTED THORACOSCOPY (VATS)/THOROCOTOMY Right 08/20/2015   Procedure: PREOP BRONCH, THORACOTOMY, RIGHT UPPER LOBECTOMY;  Surgeon: Hulda Marin, MD;  Location: [facility] ORS;  Service: General;  Laterality: Right;    Prior to Admission medications   Medication Sig Start Date End Date Taking? Authorizing Provider  albuterol (VENTOLIN HFA) 108 (90 Base) MCG/ACT inhaler INHALE 2 PUFFS INTO THE LUNGS EVERY 4 HOURS AS NEEDED FOR WHEEZING OR SHORTNESS OF BREATH. 05/03/20 05/03/21 Yes Kasa, Wallis Bamberg, MD  esomeprazole (NEXIUM) 20 MG capsule TAKE 1 CAPSULE BY MOUTH EVERY DAY BEFORE BREAKFAST 07/14/18  Yes Glori Luis, MD  rosuvastatin (CRESTOR) 20 MG tablet TAKE 1 TABLET BY MOUTH ONCE DAILY 06/26/20 06/26/21 Yes Glori Luis, MD  umeclidinium-vilanterol (ANORO ELLIPTA) 62.5-25 MCG/INH AEPB INHALE 1 PUFF BY MOUTH DAILY. 05/03/20  Yes Erin Fulling, MD  omeprazole (PRILOSEC) 20 MG capsule TAKE 1 CAPSULE BY MOUTH DAILY 08/01/20  08/01/21  Wyline Mood, MD  sildenafil (VIAGRA) 100 MG tablet TAKE 1/2-1 TABLETS (50-100 MG TOTAL) BY MOUTH DAILY AS NEEDED FOR ERECTILE DYSFUNCTION. 07/13/20 07/13/21  Glori Luis, MD    Allergies as of 08/01/2020   (No Active Allergies)    Family History  Problem Relation Age of Onset   Bladder Cancer Father    Cancer Paternal Aunt        stomach   Arthritis Other        Parent   Hyperlipidemia Other        Parent   Heart disease Other        Parent   Hypertension Other        Parent   Kidney disease Other        Parent    Social History   Socioeconomic History   Marital status: Married    Spouse name: Not on file   Number of children: Not on file   Years of education: Not on file   Highest education level: Not on file  Occupational History   Not on file  Tobacco Use   Smoking status: Former    Packs/day: 2.00    Years: 30.00    Pack years: 60.00    Types: Cigarettes    Quit date: 07/07/2015    Years since quitting: 5.0   Smokeless tobacco: Never  Vaping Use   Vaping Use: Never used  Substance and Sexual Activity  Alcohol use: Yes    Alcohol/week: 0.0 standard drinks    Comment: 1 Case of Beer / Weekly   Drug use: No   Sexual activity: Not on file  Other Topics Concern   Not on file  Social History Narrative   Not on file   Social Determinants of Health   Financial Resource Strain: Not on file  Food Insecurity: Not on file  Transportation Needs: Not on file  Physical Activity: Not on file  Stress: Not on file  Social Connections: Not on file  Intimate Partner Violence: Not on file    Review of Systems: See HPI, otherwise negative ROS  Physical Exam: BP (!) 163/110   Pulse 72   Temp 97.8 F (36.6 C) (Temporal)   Resp 18   Ht 5\' 9"  (1.753 m)   Wt 99.8 kg   SpO2 99%   BMI 32.49 kg/m  General:   Alert,  pleasant and cooperative in NAD Head:  Normocephalic and atraumatic. Neck:  Supple; no masses or thyromegaly. Lungs:  Clear  throughout to auscultation, normal respiratory effort.    Heart:  +S1, +S2, Regular rate and rhythm, No edema. Abdomen:  Soft, nontender and nondistended. Normal bowel sounds, without guarding, and without rebound.   Neurologic:  Alert and  oriented x4;  grossly normal neurologically.  Impression/Plan: is here for an endoscopy  to be performed for  evaluation of barrettes esophagus    Risks, benefits, limitations, and alternatives regarding endoscopy have been reviewed with the patient.  Questions have been answered.  All parties agreeable.   Marcelle Overlie, MD  08/10/2020, 9:22 AM

## 2020-08-10 NOTE — Op Note (Signed)
[facility] Gastroenterology Patient Name: Jimmy Howell Procedure Date: 08/10/2020 9:21 AM MRN: [id number] Account #: [id number] Date of Birth: Jan 11, 1970 Admit Type: Outpatient Age: 50 Room: [facility] ENDO ROOM 4 Gender: Male Note Status: Finalized Procedure:             Upper GI endoscopy Indications:           Screening for Barrett's esophagus Providers:             Wyline Mood MD, MD Referring MD:          Yehuda Mao. Birdie Sons (Referring MD) Medicines:             Monitored Anesthesia Care Complications:         No immediate complications. Procedure:             Pre-Anesthesia Assessment:                        - Prior to the procedure, a History and Physical was                         performed, and patient medications, allergies and                         sensitivities were reviewed. The patient's tolerance                         of previous anesthesia was reviewed.                        - The risks and benefits of the procedure and the                         sedation options and risks were discussed with the                         patient. All questions were answered and informed                         consent was obtained.                        - ASA Grade Assessment: II - A patient with mild                         systemic disease.                        After obtaining informed consent, the endoscope was                         passed under direct vision. Throughout the procedure,                         the patient's blood pressure, pulse, and oxygen                         saturations were monitored continuously. The Endoscope                         was introduced through the mouth,  and advanced to the                         third part of duodenum. The upper GI endoscopy was                         accomplished with ease. The patient tolerated the                         procedure well. Findings:      The examined duodenum was normal.      The  esophagus was normal.      The cardia and gastric fundus were normal on retroflexion.      Patchy mild inflammation characterized by congestion (edema) and       erythema was found in the gastric antrum. Biopsies were taken with a       cold forceps for histology. Impression:            - Normal examined duodenum.                        - Normal esophagus.                        - Gastritis. Biopsied. Recommendation:        - Discharge patient to home (with escort).                        - Resume previous diet.                        - Continue present medications.                        - Await pathology results. Procedure Code(s):     --- Professional ---                        [phone], Esophagogastroduodenoscopy, flexible,                         transoral; with biopsy, single or multiple Diagnosis Code(s):     --- Professional ---                        K29.70, Gastritis, unspecified, without bleeding                        Z13.810, Encounter for screening for upper                         gastrointestinal disorder CPT copyright 2019 American Medical Association. All rights reserved. The codes documented in this report are preliminary and upon coder review may  be revised to meet current compliance requirements. Wyline Mood, MD Wyline Mood MD, MD 08/10/2020 9:35:34 AM This report has been signed electronically. Number of Addenda: 0 Note Initiated On: 08/10/2020 9:21 AM Estimated Blood Loss:  Estimated blood loss: none.      [facility]

## 2020-08-13 ENCOUNTER — Encounter: Payer: Self-pay | Admitting: Gastroenterology

## 2020-08-13 LAB — SURGICAL PATHOLOGY

## 2020-08-14 ENCOUNTER — Other Ambulatory Visit: Payer: Self-pay

## 2020-08-14 MED FILL — Sildenafil Citrate Tab 100 MG: ORAL | 6 days supply | Qty: 6 | Fill #1 | Status: AC

## 2020-08-29 ENCOUNTER — Encounter: Payer: Self-pay | Admitting: Gastroenterology

## 2020-08-31 ENCOUNTER — Other Ambulatory Visit: Payer: Self-pay | Admitting: Internal Medicine

## 2020-08-31 ENCOUNTER — Other Ambulatory Visit: Payer: Self-pay

## 2020-08-31 MED ORDER — ALBUTEROL SULFATE HFA 108 (90 BASE) MCG/ACT IN AERS
2.0000 | INHALATION_SPRAY | RESPIRATORY_TRACT | 3 refills | Status: DC | PRN
  Filled 2020-08-31: qty 18, 16d supply, fill #0
  Filled 2020-09-27: qty 18, 25d supply, fill #1
  Filled 2020-10-26: qty 18, 25d supply, fill #2
  Filled 2020-11-26: qty 18, 25d supply, fill #3

## 2020-08-31 MED FILL — Umeclidinium-Vilanterol Aero Powd BA 62.5-25 MCG/ACT: RESPIRATORY_TRACT | 30 days supply | Qty: 60 | Fill #4 | Status: AC

## 2020-09-05 ENCOUNTER — Other Ambulatory Visit: Payer: Self-pay

## 2020-09-05 ENCOUNTER — Other Ambulatory Visit: Payer: Self-pay | Admitting: Family Medicine

## 2020-09-05 DIAGNOSIS — N529 Male erectile dysfunction, unspecified: Secondary | ICD-10-CM

## 2020-09-06 ENCOUNTER — Other Ambulatory Visit: Payer: Self-pay

## 2020-09-07 ENCOUNTER — Ambulatory Visit: Payer: BC Managed Care – PPO | Admitting: Family Medicine

## 2020-09-07 ENCOUNTER — Other Ambulatory Visit: Payer: Self-pay

## 2020-09-07 DIAGNOSIS — F1010 Alcohol abuse, uncomplicated: Secondary | ICD-10-CM | POA: Diagnosis not present

## 2020-09-07 DIAGNOSIS — H1013 Acute atopic conjunctivitis, bilateral: Secondary | ICD-10-CM

## 2020-09-07 DIAGNOSIS — N529 Male erectile dysfunction, unspecified: Secondary | ICD-10-CM | POA: Diagnosis not present

## 2020-09-07 DIAGNOSIS — E669 Obesity, unspecified: Secondary | ICD-10-CM

## 2020-09-07 DIAGNOSIS — H1010 Acute atopic conjunctivitis, unspecified eye: Secondary | ICD-10-CM | POA: Insufficient documentation

## 2020-09-07 MED ORDER — SILDENAFIL CITRATE 100 MG PO TABS
ORAL_TABLET | ORAL | 0 refills | Status: DC
  Filled 2020-09-07: qty 4, 30d supply, fill #0
  Filled 2020-10-10: qty 6, 30d supply, fill #1

## 2020-09-07 MED ORDER — OLOPATADINE HCL 0.1 % OP SOLN
1.0000 [drp] | Freq: Two times a day (BID) | OPHTHALMIC | 12 refills | Status: AC
  Filled 2020-09-07: qty 5, 30d supply, fill #0

## 2020-09-07 NOTE — Assessment & Plan Note (Signed)
This is a chronic issue.  We will trial Pataday eyedrops to see if that is beneficial.

## 2020-09-07 NOTE — Assessment & Plan Note (Signed)
He has had a significant reduction.  Discussed cutting down to light beer with a goal of cutting alcohol out altogether.

## 2020-09-07 NOTE — Assessment & Plan Note (Signed)
Discussed adding in exercise 2 days a week.  Discussed this could be as simple as walking briskly.  Discussed taking his lunch 2 days a week.  Discussed cutting down to 1 soda per day.  Discussed switching to a lighter beer.  We will follow-up in 2 months.

## 2020-09-07 NOTE — Progress Notes (Signed)
Marikay Alar, MD Phone: [phone]  Jimmy Howell is a 50 y.o. male who presents today for follow-up.  Obesity: Patient notes he does not exercise.  His job is relatively active as a Therapist, art.  He is diet at home is generally healthy though he eats fast food at work 5 days a week.  He does drink 2 sodas per day.  He has a sixpack of higher ABV beer on Fridays.  This is a significant reduction in his beer intake compared to previously.  Allergic conjunctivitis: This is a chronic ongoing issue.  He has watery eyes that burn.  He uses Claritin or Allegra with little benefit.  He notes no congestion or rhinorrhea.  Flonase has not been helpful.  Clear Eyes in the past was not beneficial.   Social History   Tobacco Use  Smoking Status Former   Packs/day: 2.00   Years: 30.00   Pack years: 60.00   Types: Cigarettes   Quit date: 07/07/2015   Years since quitting: 5.1  Smokeless Tobacco Never    Current Outpatient Medications on File Prior to Visit  Medication Sig Dispense Refill   albuterol (VENTOLIN HFA) 108 (90 Base) MCG/ACT inhaler INHALE 2 PUFFS INTO THE LUNGS EVERY 4 HOURS AS NEEDED FOR WHEEZING OR SHORTNESS OF BREATH. 18 g 3   omeprazole (PRILOSEC) 20 MG capsule TAKE 1 CAPSULE BY MOUTH DAILY 90 capsule 0   rosuvastatin (CRESTOR) 20 MG tablet TAKE 1 TABLET BY MOUTH ONCE DAILY 90 tablet 0   umeclidinium-vilanterol (ANORO ELLIPTA) 62.5-25 MCG/INH AEPB INHALE 1 PUFF BY MOUTH DAILY. 60 each 5   esomeprazole (NEXIUM) 20 MG capsule TAKE 1 CAPSULE BY MOUTH EVERY DAY BEFORE BREAKFAST (Patient not taking: Reported on 09/07/2020) 30 capsule 0   Current Facility-Administered Medications on File Prior to Visit  Medication Dose Route Frequency Provider Last Rate Last Admin   ipratropium-albuterol (DUONEB) 0.5-2.5 (3) MG/3ML nebulizer solution 3 mL  3 mL Nebulization Q6H Allegra Grana, FNP   3 mL at 01/01/18 1024     ROS see history of present  illness  Objective  Physical Exam Vitals:   09/07/20 1541  BP: 118/80  Pulse: 83  Temp: 98 F (36.7 C)  SpO2: 96%    BP Readings from Last 3 Encounters:  09/07/20 118/80  08/10/20 (!) 133/99  08/01/20 139/88   Wt Readings from Last 3 Encounters:  09/07/20 224 lb 3.2 oz (101.7 kg)  08/10/20 220 lb (99.8 kg)  08/01/20 224 lb 12.8 oz (102 kg)    Physical Exam Constitutional:      General: He is not in acute distress.    Appearance: He is not diaphoretic.  Eyes:     Conjunctiva/sclera: Conjunctivae normal.     Pupils: Pupils are equal, round, and reactive to light.  Cardiovascular:     Rate and Rhythm: Normal rate and regular rhythm.     Heart sounds: Normal heart sounds.  Pulmonary:     Effort: Pulmonary effort is normal.     Breath sounds: Normal breath sounds.  Skin:    General: Skin is warm and dry.  Neurological:     Mental Status: He is alert.     Assessment/Plan: Please see individual problem list.  Problem List Items Addressed This Visit     Allergic conjunctivitis (Chronic)    This is a chronic issue.  We will trial Pataday eyedrops to see if that is beneficial.      Relevant Medications  olopatadine (PATADAY) 0.1 % ophthalmic solution   Obesity (BMI 30.0-34.9) (Chronic)    Discussed adding in exercise 2 days a week.  Discussed this could be as simple as walking briskly.  Discussed taking his lunch 2 days a week.  Discussed cutting down to 1 soda per day.  Discussed switching to a lighter beer.  We will follow-up in 2 months.      Alcohol abuse    He has had a significant reduction.  Discussed cutting down to light beer with a goal of cutting alcohol out altogether.      Vasculogenic erectile dysfunction   Relevant Medications   sildenafil (VIAGRA) 100 MG tablet   Return in about 2 months (around 11/07/2020) for Weight follow-up.  This visit occurred during the SARS-CoV-2 public health emergency.  Safety protocols were in place, including  screening questions prior to the visit, additional usage of staff PPE, and extensive cleaning of exam room while observing appropriate contact time as indicated for disinfecting solutions.    Marikay Alar, MD [facility]

## 2020-09-07 NOTE — Patient Instructions (Signed)
Nice to see you. Please try to add in exercise 2 days a week with 20 to 30 minutes of moderate intensity exercise to start with. Please take your lunch to work 2 days a week. Please cut down to 1 soda per day. Please switch to light beer with a goal of cutting out alcohol altogether in the future.

## 2020-09-11 ENCOUNTER — Other Ambulatory Visit: Payer: Self-pay

## 2020-09-27 ENCOUNTER — Other Ambulatory Visit: Payer: Self-pay | Admitting: Family Medicine

## 2020-09-27 ENCOUNTER — Other Ambulatory Visit: Payer: Self-pay

## 2020-09-27 DIAGNOSIS — E785 Hyperlipidemia, unspecified: Secondary | ICD-10-CM

## 2020-09-27 MED FILL — Umeclidinium-Vilanterol Aero Powd BA 62.5-25 MCG/ACT: RESPIRATORY_TRACT | 30 days supply | Qty: 60 | Fill #5 | Status: AC

## 2020-09-28 ENCOUNTER — Other Ambulatory Visit: Payer: Self-pay | Admitting: Family Medicine

## 2020-09-28 ENCOUNTER — Other Ambulatory Visit: Payer: Self-pay

## 2020-09-28 DIAGNOSIS — E785 Hyperlipidemia, unspecified: Secondary | ICD-10-CM

## 2020-09-28 MED FILL — Rosuvastatin Calcium Tab 20 MG: ORAL | 90 days supply | Qty: 90 | Fill #0 | Status: AC

## 2020-10-10 ENCOUNTER — Other Ambulatory Visit: Payer: Self-pay

## 2020-10-26 ENCOUNTER — Other Ambulatory Visit: Payer: Self-pay | Admitting: Internal Medicine

## 2020-10-26 ENCOUNTER — Other Ambulatory Visit: Payer: Self-pay | Admitting: Gastroenterology

## 2020-10-26 ENCOUNTER — Other Ambulatory Visit: Payer: Self-pay

## 2020-10-26 ENCOUNTER — Other Ambulatory Visit: Payer: Self-pay | Admitting: Family Medicine

## 2020-10-26 MED ORDER — UMECLIDINIUM-VILANTEROL 62.5-25 MCG/ACT IN AEPB
INHALATION_SPRAY | RESPIRATORY_TRACT | 1 refills | Status: DC
  Filled 2020-10-26: qty 60, 30d supply, fill #0
  Filled 2020-11-26: qty 60, 30d supply, fill #1

## 2020-10-26 MED FILL — Omeprazole Cap Delayed Release 20 MG: ORAL | 90 days supply | Qty: 90 | Fill #0 | Status: AC

## 2020-11-09 ENCOUNTER — Ambulatory Visit: Payer: BC Managed Care – PPO | Admitting: Family Medicine

## 2020-11-13 ENCOUNTER — Ambulatory Visit: Payer: BC Managed Care – PPO | Admitting: Family Medicine

## 2020-11-19 ENCOUNTER — Other Ambulatory Visit: Payer: Self-pay

## 2020-11-19 ENCOUNTER — Other Ambulatory Visit: Payer: Self-pay | Admitting: Family Medicine

## 2020-11-19 DIAGNOSIS — N529 Male erectile dysfunction, unspecified: Secondary | ICD-10-CM

## 2020-11-19 MED ORDER — SILDENAFIL CITRATE 100 MG PO TABS
ORAL_TABLET | ORAL | 0 refills | Status: DC
  Filled 2020-11-19: qty 10, 10d supply, fill #0

## 2020-11-26 ENCOUNTER — Other Ambulatory Visit: Payer: Self-pay

## 2020-12-13 ENCOUNTER — Encounter: Payer: Self-pay | Admitting: Internal Medicine

## 2020-12-13 ENCOUNTER — Other Ambulatory Visit: Payer: Self-pay

## 2020-12-13 ENCOUNTER — Ambulatory Visit: Payer: BC Managed Care – PPO | Admitting: Internal Medicine

## 2020-12-13 VITALS — BP 132/78 | HR 79 | Temp 97.5°F | Ht 69.0 in | Wt 220.0 lb

## 2020-12-13 DIAGNOSIS — J449 Chronic obstructive pulmonary disease, unspecified: Secondary | ICD-10-CM | POA: Diagnosis not present

## 2020-12-13 NOTE — Progress Notes (Signed)
[facility] Consultation      Date: 12/13/2020,   MRN# [id number] Jimmy Howell 1970/08/23     Admission                  Current  Jimmy Howell is a 50 y.o. old male seen in consultation for COPD and lung nodules at the request of Dr. 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, h/o tobacco abuse and COPD   PFT reviewed 04/2015 Ratio 61% FEV1 54% FVC 69% RV 324% TLC 154% Moderate COPD    CHIEF COMPLAINT:   Follow-up COPD Previous history of MAI pulmonary infection   HISTORY OF PRESENT ILLNESS   Patient shortness of breath and dyspnea exertion has stabilized with inhaler therapy  Stable over the last year Continues to take inhalers as prescribed  Quit smoking 5 years ago   No exacerbation at this time No evidence of heart failure at this time No evidence or signs of infection at this time No respiratory distress No fevers, chills, nausea, vomiting, diarrhea No evidence of lower extremity edema No evidence hemoptysis  Albuterol as needed COPD under control     Patient has gained a significant amount of weight over the last year and a half Original weight was 170 now weighs approximately 225 pounds Which is increased weight gain of 55 pounds    Current Outpatient Medications:    albuterol (VENTOLIN HFA) 108 (90 Base) MCG/ACT inhaler, INHALE 2 PUFFS INTO THE LUNGS EVERY 4 HOURS AS NEEDED FOR WHEEZING OR SHORTNESS OF BREATH., Disp: 18 g, Rfl: 3   olopatadine (PATADAY) 0.1 % ophthalmic solution, Place 1 drop into both eyes 2 (two) times daily., Disp: 5 mL, Rfl: 12   omeprazole (PRILOSEC) 20 MG capsule, TAKE 1 CAPSULE BY MOUTH DAILY, Disp: 90 capsule, Rfl: 0   rosuvastatin (CRESTOR) 20 MG tablet, TAKE 1 TABLET BY MOUTH ONCE DAILY, Disp: 90 tablet, Rfl: 0   sildenafil (VIAGRA) 100 MG tablet, TAKE 1/2-1 TABLET (50-100 MG TOTAL) BY MOUTH DAILY AS NEEDED FOR  ERECTILE DYSFUNCTION., Disp: 10 tablet, Rfl: 0   umeclidinium-vilanterol (ANORO ELLIPTA) 62.5-25 MCG/ACT AEPB, INHALE 1 PUFF BY MOUTH DAILY., Disp: 60 each, Rfl: 1  Current Facility-Administered Medications:    ipratropium-albuterol (DUONEB) 0.5-2.5 (3) MG/3ML nebulizer solution 3 mL, 3 mL, Nebulization, Q6H, Arnett, Lyn Records, FNP, 3 mL at 01/01/18 1024     REVIEW OF SYSTEMS   Review of Systems  Constitutional:  Negative for chills, fever, malaise/fatigue and weight loss.  HENT:  Negative for congestion.   Respiratory:  Positive for shortness of breath. Negative for cough, hemoptysis, sputum production and wheezing.   Cardiovascular:  Negative for chest pain, palpitations, orthopnea and leg swelling.  Gastrointestinal:  Negative for heartburn and nausea.  Neurological:  Negative for tingling.  Psychiatric/Behavioral:  The patient is not nervous/anxious.   All other systems reviewed and are negative.  .vs.vs    PHYSICAL EXAM  Physical Exam Constitutional:      General: He is not in acute distress.    Appearance: He is not diaphoretic.  Cardiovascular:     Rate and Rhythm: Normal rate and regular rhythm.     Heart sounds: Normal heart sounds. No murmur heard. Pulmonary:     Effort: Pulmonary effort is normal. No respiratory distress.     Breath sounds: Normal breath sounds. No stridor. No wheezing or rales.  Skin:    General:  Skin is warm.  Neurological:     Mental Status: He is alert and oriented to person, place, and time.    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 All questions answered  Follow-up in 1 year  Total time spent 21 minutes   Jimmy Howell, M.D.  [facility]  Medical Director [facility] Medical Director [facility] Cardio-Pulmonary Department

## 2020-12-13 NOTE — Patient Instructions (Signed)
ANORO as  prescribed Albuterol as needed

## 2020-12-21 ENCOUNTER — Other Ambulatory Visit: Payer: Self-pay

## 2020-12-21 ENCOUNTER — Encounter: Payer: Self-pay | Admitting: Family Medicine

## 2020-12-21 ENCOUNTER — Ambulatory Visit: Payer: BC Managed Care – PPO | Admitting: Family Medicine

## 2020-12-21 DIAGNOSIS — F1010 Alcohol abuse, uncomplicated: Secondary | ICD-10-CM

## 2020-12-21 DIAGNOSIS — E669 Obesity, unspecified: Secondary | ICD-10-CM | POA: Diagnosis not present

## 2020-12-21 NOTE — Assessment & Plan Note (Signed)
Continues to further reduce his alcohol intake.  Discussed having him cut down on the number of alcoholic beverages he is drinking on Fridays.

## 2020-12-21 NOTE — Assessment & Plan Note (Signed)
Patient's weight has trended down some.  I congratulated him on his diet and exercise changes.  I encouraged him to continue with those and I suspect his weight will continue to trend down.  He has noticed that he has lost inches and I discussed that the weight typically follows the change in inches.  He will follow-up in 3 months.

## 2020-12-21 NOTE — Patient Instructions (Signed)
Nice to see you. Please continue with diet and exercise. Please try to cut down further on your alcohol intake and soda intake.

## 2020-12-21 NOTE — Progress Notes (Signed)
Marikay Alar, MD Phone: [phone]  Jimmy Howell is a 50 y.o. male who presents today for f/u.  Obesity: Patient has made significant changes.  He is walking 3 days a week for 30 to 45 minutes.  He is taking his lunch that consists of a chicken wrap with minimal mayo and some Pringles.  He does not have any snacks.  He has fruit for his morning meal.  He generally eats a baked meat and vegetables for dinner.  He has cut his soda down to 1/day.  He has switched over to lighter beer.  He has 6 beers on Fridays.  He notes they are also eating out less.  Social History   Tobacco Use  Smoking Status Former   Packs/day: 2.00   Years: 30.00   Pack years: 60.00   Types: Cigarettes   Quit date: 07/07/2015   Years since quitting: 5.4  Smokeless Tobacco Never    Current Outpatient Medications on File Prior to Visit  Medication Sig Dispense Refill   albuterol (VENTOLIN HFA) 108 (90 Base) MCG/ACT inhaler INHALE 2 PUFFS INTO THE LUNGS EVERY 4 HOURS AS NEEDED FOR WHEEZING OR SHORTNESS OF BREATH. 18 g 3   olopatadine (PATADAY) 0.1 % ophthalmic solution Place 1 drop into both eyes 2 (two) times daily. 5 mL 12   omeprazole (PRILOSEC) 20 MG capsule TAKE 1 CAPSULE BY MOUTH DAILY 90 capsule 0   rosuvastatin (CRESTOR) 20 MG tablet TAKE 1 TABLET BY MOUTH ONCE DAILY 90 tablet 0   sildenafil (VIAGRA) 100 MG tablet TAKE 1/2-1 TABLET (50-100 MG TOTAL) BY MOUTH DAILY AS NEEDED FOR ERECTILE DYSFUNCTION. 10 tablet 0   umeclidinium-vilanterol (ANORO ELLIPTA) 62.5-25 MCG/ACT AEPB INHALE 1 PUFF BY MOUTH DAILY. 60 each 1   Current Facility-Administered Medications on File Prior to Visit  Medication Dose Route Frequency Provider Last Rate Last Admin   ipratropium-albuterol (DUONEB) 0.5-2.5 (3) MG/3ML nebulizer solution 3 mL  3 mL Nebulization Q6H Allegra Grana, FNP   3 mL at 01/01/18 1024     ROS see history of present illness  Objective  Physical Exam Vitals:   12/21/20 1409  BP: 120/70  Pulse:  68  Temp: 97.6 F (36.4 C)  SpO2: 97%    BP Readings from Last 3 Encounters:  12/21/20 120/70  12/13/20 132/78  09/07/20 118/80   Wt Readings from Last 3 Encounters:  12/21/20 220 lb 6.4 oz (100 kg)  12/13/20 220 lb (99.8 kg)  09/07/20 224 lb 3.2 oz (101.7 kg)    Physical Exam Constitutional:      General: He is not in acute distress.    Appearance: He is not diaphoretic.  Cardiovascular:     Rate and Rhythm: Normal rate and regular rhythm.     Heart sounds: Normal heart sounds.  Pulmonary:     Effort: Pulmonary effort is normal.     Breath sounds: Normal breath sounds.  Skin:    General: Skin is warm and dry.  Neurological:     Mental Status: He is alert.     Assessment/Plan: Please see individual problem list.  Problem List Items Addressed This Visit     Obesity (BMI 30.0-34.9) (Chronic)    Patient's weight has trended down some.  I congratulated him on his diet and exercise changes.  I encouraged him to continue with those and I suspect his weight will continue to trend down.  He has noticed that he has lost inches and I discussed that the weight typically  follows the change in inches.  He will follow-up in 3 months.      Alcohol abuse    Continues to further reduce his alcohol intake.  Discussed having him cut down on the number of alcoholic beverages he is drinking on Fridays.        Return in about 3 months (around 03/21/2021) for Weight follow-up.  This visit occurred during the SARS-CoV-2 public health emergency.  Safety protocols were in place, including screening questions prior to the visit, additional usage of staff PPE, and extensive cleaning of exam room while observing appropriate contact time as indicated for disinfecting solutions.    Marikay Alar, MD [facility]

## 2020-12-25 ENCOUNTER — Other Ambulatory Visit: Payer: Self-pay | Admitting: Internal Medicine

## 2020-12-25 ENCOUNTER — Other Ambulatory Visit: Payer: Self-pay

## 2020-12-25 ENCOUNTER — Other Ambulatory Visit: Payer: Self-pay | Admitting: Family Medicine

## 2020-12-25 DIAGNOSIS — E785 Hyperlipidemia, unspecified: Secondary | ICD-10-CM

## 2020-12-25 MED ORDER — ALBUTEROL SULFATE HFA 108 (90 BASE) MCG/ACT IN AERS
2.0000 | INHALATION_SPRAY | RESPIRATORY_TRACT | 3 refills | Status: DC | PRN
  Filled 2020-12-25: qty 18, 25d supply, fill #0
  Filled 2021-04-18: qty 18, 25d supply, fill #1
  Filled 2021-05-23: qty 18, 25d supply, fill #2
  Filled 2021-06-21: qty 18, 25d supply, fill #3

## 2020-12-25 MED ORDER — ROSUVASTATIN CALCIUM 20 MG PO TABS
ORAL_TABLET | Freq: Every day | ORAL | 0 refills | Status: DC
  Filled 2020-12-25: qty 90, 90d supply, fill #0

## 2020-12-25 MED ORDER — ANORO ELLIPTA 62.5-25 MCG/ACT IN AEPB
INHALATION_SPRAY | RESPIRATORY_TRACT | 1 refills | Status: DC
  Filled 2020-12-25: qty 60, 30d supply, fill #0
  Filled 2021-01-21: qty 60, 30d supply, fill #1

## 2020-12-26 ENCOUNTER — Other Ambulatory Visit: Payer: Self-pay

## 2021-01-21 ENCOUNTER — Other Ambulatory Visit: Payer: Self-pay

## 2021-01-21 ENCOUNTER — Other Ambulatory Visit: Payer: Self-pay | Admitting: Family Medicine

## 2021-01-21 DIAGNOSIS — N529 Male erectile dysfunction, unspecified: Secondary | ICD-10-CM

## 2021-01-22 ENCOUNTER — Other Ambulatory Visit: Payer: Self-pay

## 2021-01-22 MED ORDER — OMEPRAZOLE 20 MG PO CPDR
DELAYED_RELEASE_CAPSULE | Freq: Every day | ORAL | 3 refills | Status: DC
  Filled 2021-01-22: qty 30, 30d supply, fill #0
  Filled 2021-02-14: qty 90, 90d supply, fill #0
  Filled 2021-04-18: qty 90, 90d supply, fill #1
  Filled 2021-06-21: qty 90, 90d supply, fill #2
  Filled 2021-10-04: qty 90, 90d supply, fill #3

## 2021-01-22 MED ORDER — SILDENAFIL CITRATE 100 MG PO TABS
ORAL_TABLET | ORAL | 0 refills | Status: DC
  Filled 2021-01-22: qty 4, 30d supply, fill #0
  Filled 2021-02-14: qty 4, 30d supply, fill #1

## 2021-02-14 ENCOUNTER — Other Ambulatory Visit: Payer: Self-pay | Admitting: Internal Medicine

## 2021-02-14 ENCOUNTER — Other Ambulatory Visit: Payer: Self-pay

## 2021-02-14 MED ORDER — ANORO ELLIPTA 62.5-25 MCG/ACT IN AEPB
INHALATION_SPRAY | RESPIRATORY_TRACT | 1 refills | Status: DC
  Filled 2021-02-14 – 2021-04-18 (×2): qty 60, 30d supply, fill #0
  Filled 2021-05-23: qty 60, 30d supply, fill #1

## 2021-02-18 ENCOUNTER — Telehealth: Payer: Self-pay | Admitting: Internal Medicine

## 2021-02-18 MED ORDER — ANORO ELLIPTA 62.5-25 MCG/ACT IN AEPB: 1.0000 | INHALATION_SPRAY | Freq: Every day | RESPIRATORY_TRACT | 0 refills | Status: DC

## 2021-02-18 NOTE — Telephone Encounter (Signed)
Spoke to patient's spouse, Cindy(DPR). [name] is requesting a sample of anoro.  Sample has been placed up front for pickup. Nothing further needed.

## 2021-03-11 ENCOUNTER — Other Ambulatory Visit: Payer: Self-pay | Admitting: Family Medicine

## 2021-03-11 ENCOUNTER — Other Ambulatory Visit: Payer: Self-pay

## 2021-03-11 DIAGNOSIS — N529 Male erectile dysfunction, unspecified: Secondary | ICD-10-CM

## 2021-03-11 MED ORDER — SILDENAFIL CITRATE 100 MG PO TABS
ORAL_TABLET | ORAL | 0 refills | Status: DC
  Filled 2021-03-11: qty 10, 10d supply, fill #0

## 2021-03-12 ENCOUNTER — Other Ambulatory Visit: Payer: Self-pay

## 2021-03-20 ENCOUNTER — Telehealth: Payer: Self-pay | Admitting: Internal Medicine

## 2021-03-20 MED ORDER — ANORO ELLIPTA 62.5-25 MCG/ACT IN AEPB: 1.0000 | INHALATION_SPRAY | Freq: Every day | RESPIRATORY_TRACT | 0 refills | Status: DC

## 2021-03-20 NOTE — Telephone Encounter (Signed)
Spoke to patient's spouse, Cindy(DPR). ?She stated that patient recently switched jobs and he does not qualify for insurance for 90 days. ?Two samples of Anoro has been placed up front for pick. ?[name] is questioning if there is a cheaper alternative to Anoro that she can pay out of pocket for until patient is insured.  ? ? ?PA team, please advise. Thanks  ?

## 2021-03-21 ENCOUNTER — Other Ambulatory Visit (HOSPITAL_COMMUNITY): Payer: Self-pay

## 2021-03-21 NOTE — Telephone Encounter (Signed)
Spoke to patient's spouse, Cindy(DPR) is aware of below message and voiced her understanding. ?Nothing further needed. ?

## 2021-03-21 NOTE — Telephone Encounter (Signed)
Cash prices may vary for [employer]. Pt would need to call their pharmacy and ask the cash price. Additionally pt can check price for medication through [email or web address].

## 2021-03-22 ENCOUNTER — Ambulatory Visit: Payer: BC Managed Care – PPO | Admitting: Family Medicine

## 2021-04-01 ENCOUNTER — Telehealth: Payer: Self-pay | Admitting: Internal Medicine

## 2021-04-01 MED ORDER — ANORO ELLIPTA 62.5-25 MCG/ACT IN AEPB: 1.0000 | INHALATION_SPRAY | Freq: Every day | RESPIRATORY_TRACT | 0 refills | Status: DC

## 2021-04-01 NOTE — Telephone Encounter (Signed)
Patient's spouse, Cindy(DPR) stopped by for Anoro samples. ? Three samples given.  ?Nothing further needed.  ? ?

## 2021-04-18 ENCOUNTER — Other Ambulatory Visit: Payer: Self-pay

## 2021-04-18 ENCOUNTER — Other Ambulatory Visit: Payer: Self-pay | Admitting: Internal Medicine

## 2021-04-18 DIAGNOSIS — N529 Male erectile dysfunction, unspecified: Secondary | ICD-10-CM

## 2021-04-19 ENCOUNTER — Other Ambulatory Visit: Payer: Self-pay

## 2021-04-19 MED ORDER — SILDENAFIL CITRATE 100 MG PO TABS
ORAL_TABLET | ORAL | 0 refills | Status: DC
  Filled 2021-04-19: qty 10, 30d supply, fill #0

## 2021-04-26 ENCOUNTER — Other Ambulatory Visit: Payer: Self-pay

## 2021-04-26 ENCOUNTER — Telehealth: Payer: Self-pay | Admitting: *Deleted

## 2021-04-26 ENCOUNTER — Other Ambulatory Visit: Payer: Self-pay | Admitting: Family Medicine

## 2021-04-26 DIAGNOSIS — E785 Hyperlipidemia, unspecified: Secondary | ICD-10-CM

## 2021-04-26 MED FILL — Rosuvastatin Calcium Tab 20 MG: ORAL | 90 days supply | Qty: 90 | Fill #0 | Status: AC

## 2021-04-26 NOTE — Telephone Encounter (Signed)
LMTC to schedule Yearly Lung CA CT Scan. 

## 2021-05-08 ENCOUNTER — Other Ambulatory Visit: Payer: Self-pay

## 2021-05-23 ENCOUNTER — Other Ambulatory Visit: Payer: Self-pay

## 2021-05-23 ENCOUNTER — Other Ambulatory Visit: Payer: Self-pay | Admitting: Family Medicine

## 2021-05-23 DIAGNOSIS — N529 Male erectile dysfunction, unspecified: Secondary | ICD-10-CM

## 2021-05-23 MED ORDER — SILDENAFIL CITRATE 100 MG PO TABS
ORAL_TABLET | ORAL | 0 refills | Status: DC
  Filled 2021-05-23: qty 10, 30d supply, fill #0

## 2021-05-31 ENCOUNTER — Encounter: Payer: Self-pay | Admitting: Family Medicine

## 2021-05-31 ENCOUNTER — Telehealth (INDEPENDENT_AMBULATORY_CARE_PROVIDER_SITE_OTHER): Payer: Self-pay | Admitting: Family Medicine

## 2021-05-31 VITALS — Ht 69.0 in | Wt 220.0 lb

## 2021-05-31 DIAGNOSIS — K219 Gastro-esophageal reflux disease without esophagitis: Secondary | ICD-10-CM

## 2021-05-31 DIAGNOSIS — K760 Fatty (change of) liver, not elsewhere classified: Secondary | ICD-10-CM

## 2021-05-31 DIAGNOSIS — E669 Obesity, unspecified: Secondary | ICD-10-CM

## 2021-05-31 DIAGNOSIS — J449 Chronic obstructive pulmonary disease, unspecified: Secondary | ICD-10-CM

## 2021-05-31 DIAGNOSIS — E785 Hyperlipidemia, unspecified: Secondary | ICD-10-CM

## 2021-05-31 DIAGNOSIS — Z125 Encounter for screening for malignant neoplasm of prostate: Secondary | ICD-10-CM

## 2021-05-31 NOTE — Assessment & Plan Note (Signed)
Asymptomatic.  He will continue Anoro 1 puff daily.

## 2021-05-31 NOTE — Assessment & Plan Note (Signed)
I encouraged healthy diet and exercise.  Discussed adequate fruit and vegetable intake.  Discussed limiting fried and fatty foods as well as sugary drinks.

## 2021-05-31 NOTE — Assessment & Plan Note (Signed)
Chronic issue.  Asymptomatic.  He will continue omeprazole 20 mg daily.

## 2021-05-31 NOTE — Assessment & Plan Note (Signed)
Check labs.  Continue Crestor 20 mg daily. 

## 2021-05-31 NOTE — Progress Notes (Signed)
Virtual Visit via video Note  This visit type was conducted due to national recommendations for restrictions regarding the COVID-19 pandemic (e.g. social distancing).  This format is felt to be most appropriate for this patient at this time.  All issues noted in this document were discussed and addressed.  No physical exam was performed (except for noted visual exam findings with Video Visits).   I connected with Jimmy Howell today at  4:30 PM EDT by a video enabled telemedicine application and verified that I am speaking with the correct person using two identifiers. Location patient: home Location provider: home office Persons participating in the virtual visit: patient, provider  I discussed the limitations, risks, security and privacy concerns of performing an evaluation and management service by telephone and the availability of in person appointments. I also discussed with the patient that there may be a patient responsible charge related to this service. The patient expressed understanding and agreed to proceed.  Reason for visit: f/u.  HPI: HYPERLIPIDEMIA Symptoms Chest pain on exertion:  no   Leg claudication:   no Medications: Compliance- taking crestor Right upper quadrant pain- no  Muscle aches- no Lipid Panel     Component Value Date/Time   CHOL 123 02/23/2020 1255   TRIG 162.0 (H) 02/23/2020 1255   HDL 48.10 02/23/2020 1255   CHOLHDL 3 02/23/2020 1255   VLDL 32.4 02/23/2020 1255   LDLCALC 43 02/23/2020 1255   LDLDIRECT 56.0 02/17/2018 0901   GERD:   Reflux symptoms: no   Abd pain: no   Blood in stool: no  Dysphagia: no   EGD: August 2022 with gastritis  Medication: omeprazole  COPD: Medication compliance- taking anoro  Rescue inhaler use- no Dyspnea- no  Wheezing- no  Cough- no    Obesity: Patient notes he gets some activity around the house and at work though no specific exercise.  Notes his diet is prewash the same as it was previously.  He is taking his  lunch instead of eating out.     ROS: See pertinent positives and negatives per HPI.  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 Patient like assistance and wants to use Wellbutrin. We'll start on Wellbutrin 150 mg daily for 3 days and then increase to 150 mg twice a day.    Past Surgical History:  Procedure Laterality Date   ESOPHAGOGASTRODUODENOSCOPY (EGD) WITH PROPOFOL N/A 08/10/2020   Procedure: ESOPHAGOGASTRODUODENOSCOPY (EGD) WITH PROPOFOL;  Surgeon: Wyline Mood, MD;  Location: [facility] ENDOSCOPY;  Service: Gastroenterology;  Laterality: N/A;   FINGER SURGERY     HERNIA REPAIR     Inguinal Hernia Repair   VIDEO ASSISTED THORACOSCOPY (VATS)/THOROCOTOMY Right 08/20/2015   Procedure: PREOP BRONCH, THORACOTOMY, RIGHT UPPER LOBECTOMY;  Surgeon: Hulda Marin, MD;  Location: [facility] ORS;  Service: General;  Laterality: Right;    Family History  Problem Relation Age of Onset   Bladder Cancer Father    Cancer Paternal Aunt        stomach   Arthritis Other        Parent   Hyperlipidemia Other  Parent   Heart disease Other        Parent   Hypertension Other        Parent   Kidney disease Other        Parent    SOCIAL HX: Former smoker   Current Outpatient Medications:    albuterol (VENTOLIN HFA) 108 (90 Base) MCG/ACT inhaler, INHALE 2 PUFFS INTO THE LUNGS EVERY 4 HOURS AS NEEDED FOR WHEEZING OR SHORTNESS OF BREATH., Disp: 18 g, Rfl: 3   olopatadine (PATADAY) 0.1 % ophthalmic solution, Place 1 drop into both eyes 2 (two) times daily., Disp: 5 mL, Rfl: 12   omeprazole (PRILOSEC) 20 MG capsule, TAKE 1 CAPSULE BY MOUTH DAILY, Disp:  90 capsule, Rfl: 3   rosuvastatin (CRESTOR) 20 MG tablet, TAKE 1 TABLET BY MOUTH ONCE DAILY, Disp: 90 tablet, Rfl: 0   sildenafil (VIAGRA) 100 MG tablet, TAKE 1/2-1 TABLET (50-100 MG TOTAL) BY MOUTH DAILY AS NEEDED FOR ERECTILE DYSFUNCTION., Disp: 10 tablet, Rfl: 0   umeclidinium-vilanterol (ANORO ELLIPTA) 62.5-25 MCG/ACT AEPB, INHALE 1 PUFF BY MOUTH DAILY., Disp: 60 each, Rfl: 1  Current Facility-Administered Medications:    ipratropium-albuterol (DUONEB) 0.5-2.5 (3) MG/3ML nebulizer solution 3 mL, 3 mL, Nebulization, Q6H, Arnett, Lyn Records, FNP, 3 mL at 01/01/18 1024  EXAM:  VITALS per patient if applicable:  GENERAL: alert, oriented, appears well and in no acute distress  HEENT: atraumatic, conjunttiva clear, no obvious abnormalities on inspection of external nose and ears  NECK: normal movements of the head and neck  LUNGS: on inspection no signs of respiratory distress, breathing rate appears normal, no obvious gross SOB, gasping or wheezing  CV: no obvious cyanosis  MS: moves all visible extremities without noticeable abnormality  PSYCH/NEURO: pleasant and cooperative, no obvious depression or anxiety, speech and thought processing grossly intact  ASSESSMENT AND PLAN:  Discussed the following assessment and plan:  Problem List Items Addressed This Visit     Acid reflux (Chronic)    Chronic issue.  Asymptomatic.  He will continue omeprazole 20 mg daily.       COPD (chronic obstructive pulmonary disease) (HCC) (Chronic)    Asymptomatic.  He will continue Anoro 1 puff daily.       Hyperlipidemia (Chronic)    Check labs.  Continue Crestor 20 mg daily.       Relevant Orders   Comprehensive metabolic panel   Lipid panel   Obesity (BMI 30.0-34.9) (Chronic)    I encouraged healthy diet and exercise.  Discussed adequate fruit and vegetable intake.  Discussed limiting fried and fatty foods as well as sugary drinks.       Relevant Orders   Hemoglobin A1c   Fatty  liver - Primary   Relevant Orders   Comprehensive metabolic panel   Other Visit Diagnoses     Prostate cancer screening       Relevant Orders   PSA       Return in about 1 week (around 06/07/2021) for labs, 6 months PCP.   I discussed the assessment and treatment plan with the patient. The patient was provided an opportunity to ask questions and all were answered. The patient agreed with the plan and demonstrated an understanding of the instructions.   The patient was advised to call back or seek an in-person evaluation if the symptoms worsen or if the condition fails to improve as anticipated.  Marikay Alar, MD

## 2021-06-21 ENCOUNTER — Other Ambulatory Visit: Payer: Self-pay

## 2021-06-21 ENCOUNTER — Other Ambulatory Visit: Payer: Self-pay | Admitting: Family Medicine

## 2021-06-21 ENCOUNTER — Other Ambulatory Visit: Payer: Self-pay | Admitting: Internal Medicine

## 2021-06-21 DIAGNOSIS — N529 Male erectile dysfunction, unspecified: Secondary | ICD-10-CM

## 2021-06-21 DIAGNOSIS — E785 Hyperlipidemia, unspecified: Secondary | ICD-10-CM

## 2021-06-21 MED FILL — Umeclidinium-Vilanterol Aero Powd BA 62.5-25 MCG/ACT: RESPIRATORY_TRACT | 30 days supply | Qty: 60 | Fill #0 | Status: CN

## 2021-06-21 MED FILL — Sildenafil Citrate Tab 100 MG: ORAL | 30 days supply | Qty: 10 | Fill #0 | Status: AC

## 2021-06-21 MED FILL — Rosuvastatin Calcium Tab 20 MG: ORAL | 90 days supply | Qty: 90 | Fill #0 | Status: AC

## 2021-06-21 MED FILL — Umeclidinium-Vilanterol Aero Powd BA 62.5-25 MCG/ACT: RESPIRATORY_TRACT | 30 days supply | Qty: 60 | Fill #0 | Status: AC

## 2021-07-22 ENCOUNTER — Telehealth: Payer: Self-pay | Admitting: Internal Medicine

## 2021-07-22 ENCOUNTER — Other Ambulatory Visit: Payer: Self-pay

## 2021-07-22 ENCOUNTER — Other Ambulatory Visit: Payer: Self-pay | Admitting: Family Medicine

## 2021-07-22 DIAGNOSIS — N529 Male erectile dysfunction, unspecified: Secondary | ICD-10-CM

## 2021-07-22 MED ORDER — SILDENAFIL CITRATE 100 MG PO TABS
ORAL_TABLET | ORAL | 0 refills | Status: DC
  Filled 2021-07-22: qty 10, 30d supply, fill #0

## 2021-07-22 MED ORDER — ANORO ELLIPTA 62.5-25 MCG/ACT IN AEPB: 1.0000 | INHALATION_SPRAY | Freq: Every day | RESPIRATORY_TRACT | 0 refills | Status: DC

## 2021-07-22 NOTE — Telephone Encounter (Signed)
One sample of Anoro has been placed up front for pickup.  Patient's spouse, Cindy(DPR) is aware and voiced her understanding.  Nothing further needed.

## 2021-08-05 ENCOUNTER — Other Ambulatory Visit: Payer: Self-pay

## 2021-08-05 MED FILL — Umeclidinium-Vilanterol Aero Powd BA 62.5-25 MCG/ACT: RESPIRATORY_TRACT | 30 days supply | Qty: 60 | Fill #1 | Status: CN

## 2021-08-06 ENCOUNTER — Other Ambulatory Visit: Payer: Self-pay

## 2021-08-06 MED FILL — Umeclidinium-Vilanterol Aero Powd BA 62.5-25 MCG/ACT: RESPIRATORY_TRACT | 30 days supply | Qty: 60 | Fill #1 | Status: AC

## 2021-08-14 ENCOUNTER — Other Ambulatory Visit: Payer: Self-pay

## 2021-08-14 ENCOUNTER — Other Ambulatory Visit: Payer: Self-pay | Admitting: Internal Medicine

## 2021-08-14 MED ORDER — ALBUTEROL SULFATE HFA 108 (90 BASE) MCG/ACT IN AERS
2.0000 | INHALATION_SPRAY | RESPIRATORY_TRACT | 3 refills | Status: DC | PRN
  Filled 2021-08-14: qty 18, 30d supply, fill #0
  Filled 2021-08-30: qty 18, 16d supply, fill #1
  Filled 2021-10-04: qty 18, 16d supply, fill #2
  Filled 2021-10-28: qty 18, 16d supply, fill #3

## 2021-08-30 ENCOUNTER — Other Ambulatory Visit: Payer: Self-pay

## 2021-08-30 ENCOUNTER — Other Ambulatory Visit: Payer: Self-pay | Admitting: Internal Medicine

## 2021-08-30 MED ORDER — ANORO ELLIPTA 62.5-25 MCG/ACT IN AEPB
INHALATION_SPRAY | RESPIRATORY_TRACT | 1 refills | Status: DC
  Filled 2021-08-30: qty 60, 30d supply, fill #0
  Filled 2021-10-04: qty 60, 30d supply, fill #1

## 2021-09-04 ENCOUNTER — Other Ambulatory Visit: Payer: Self-pay | Admitting: Family Medicine

## 2021-09-04 ENCOUNTER — Other Ambulatory Visit: Payer: Self-pay

## 2021-09-04 DIAGNOSIS — N529 Male erectile dysfunction, unspecified: Secondary | ICD-10-CM

## 2021-09-04 MED ORDER — SILDENAFIL CITRATE 100 MG PO TABS
ORAL_TABLET | ORAL | 0 refills | Status: DC
  Filled 2021-09-04: qty 10, 30d supply, fill #0

## 2021-10-04 ENCOUNTER — Other Ambulatory Visit: Payer: Self-pay

## 2021-10-04 ENCOUNTER — Other Ambulatory Visit: Payer: Self-pay | Admitting: Family Medicine

## 2021-10-04 DIAGNOSIS — N529 Male erectile dysfunction, unspecified: Secondary | ICD-10-CM

## 2021-10-04 DIAGNOSIS — E785 Hyperlipidemia, unspecified: Secondary | ICD-10-CM

## 2021-10-04 MED ORDER — ROSUVASTATIN CALCIUM 20 MG PO TABS
ORAL_TABLET | Freq: Every day | ORAL | 0 refills | Status: DC
  Filled 2021-10-04: qty 90, 90d supply, fill #0

## 2021-10-04 MED ORDER — SILDENAFIL CITRATE 100 MG PO TABS
ORAL_TABLET | ORAL | 0 refills | Status: DC
  Filled 2021-10-04: qty 10, 30d supply, fill #0

## 2021-10-28 ENCOUNTER — Other Ambulatory Visit: Payer: Self-pay | Admitting: Pulmonary Disease

## 2021-10-28 ENCOUNTER — Other Ambulatory Visit: Payer: Self-pay

## 2021-10-28 MED ORDER — ANORO ELLIPTA 62.5-25 MCG/ACT IN AEPB
INHALATION_SPRAY | RESPIRATORY_TRACT | 1 refills | Status: DC
Start: 2021-10-28 — End: 2022-01-03
  Filled 2021-10-28: qty 60, 30d supply, fill #0
  Filled 2021-11-29: qty 60, 30d supply, fill #1

## 2021-11-08 ENCOUNTER — Other Ambulatory Visit: Payer: Self-pay

## 2021-11-08 ENCOUNTER — Ambulatory Visit
Admission: EM | Admit: 2021-11-08 | Discharge: 2021-11-08 | Disposition: A | Discharge status: Home / Self Care | Payer: BC Managed Care – PPO | Attending: Emergency Medicine | Admitting: Emergency Medicine

## 2021-11-08 DIAGNOSIS — M25511 Pain in right shoulder: Secondary | ICD-10-CM | POA: Diagnosis not present

## 2021-11-08 MED ORDER — CYCLOBENZAPRINE HCL 10 MG PO TABS
10.0000 mg | ORAL_TABLET | Freq: Two times a day (BID) | ORAL | 0 refills | Status: DC | PRN
  Filled 2021-11-08: qty 20, 10d supply, fill #0

## 2021-11-08 MED ORDER — IBUPROFEN 800 MG PO TABS
800.0000 mg | ORAL_TABLET | Freq: Three times a day (TID) | ORAL | 0 refills | Status: DC | PRN
  Filled 2021-11-08: qty 21, 7d supply, fill #0

## 2021-11-08 NOTE — ED Triage Notes (Addendum)
Patient to Urgent Care with complaints of intermittent right shoulder pain x2 weeks. Denies any known injury. 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 Describes pain as sharp.  Denies any previous injury.  Has been taking tylenol/ ibuprofen.

## 2021-11-08 NOTE — ED Provider Notes (Signed)
Jimmy Howell    CSN: [id number] Arrival date & time: 11/08/21  1237      History   Chief Complaint Chief Complaint  Patient presents with   Shoulder Pain    HPI Jimmy Howell is a 51 y.o. male.  Patient presents with 2 week history of intermittent right shoulder pain.  No known aggravating factors; occurs spontaneously; improves with rest.  Currently no pain.  No trauma but he works in [employer].  The pain radiates to the right side of his neck.  No numbness, weakness, wounds, redness, bruising, or other symptoms.  Treatment at home with Tylenol and ibuprofen.  His medical history includes cervical spine stenosis, DDD, low back pain, COPD, obesity.  The history is provided by the patient, the spouse and medical records.    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 Patient like assistance and wants to use Wellbutrin. We'll start on Wellbutrin 150 mg daily for 3 days and then increase to 150 mg twice a day.    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   Low back pain 03/22/2015   Lung nodules 02/19/2015   COPD (chronic obstructive pulmonary disease) (HCC) 02/19/2015   BPH (benign prostatic hyperplasia) 02/19/2015   Liver lesion 02/19/2015   Degeneration of intervertebral disc of cervical region 11/21/2013   Cervical spinal stenosis 11/21/2013   Back ache 08/31/2013    Past Surgical History:  Procedure Laterality Date   ESOPHAGOGASTRODUODENOSCOPY (EGD) WITH PROPOFOL N/A 08/10/2020   Procedure: ESOPHAGOGASTRODUODENOSCOPY (EGD) WITH PROPOFOL;  Surgeon: Wyline Mood, MD;  Location: [facility] ENDOSCOPY;  Service: Gastroenterology;  Laterality: N/A;   FINGER SURGERY     HERNIA REPAIR     Inguinal Hernia Repair   VIDEO ASSISTED THORACOSCOPY (VATS)/THOROCOTOMY Right 08/20/2015   Procedure: PREOP BRONCH, THORACOTOMY, RIGHT UPPER LOBECTOMY;  Surgeon: Hulda Marin, MD;  Location: [facility] ORS;  Service: General;  Laterality: Right;       Home Medications    Prior to Admission medications   Medication Sig Start Date End Date Taking? Authorizing Provider  cyclobenzaprine (FLEXERIL) 10 MG tablet Take 1 tablet (10 mg total) by mouth 2 (two) times daily as needed for muscle spasms. 11/08/21  Yes Mickie Bail, NP  ibuprofen (ADVIL) 800 MG tablet Take 1 tablet (800 mg total) by mouth every 8 (eight) hours as needed. 11/08/21  Yes Mickie Bail, NP  albuterol (VENTOLIN HFA) 108 (90 Base) MCG/ACT inhaler INHALE 2 PUFFS INTO THE LUNGS EVERY 4 HOURS AS NEEDED FOR WHEEZING OR SHORTNESS OF BREATH. 08/14/21  08/14/22  Erin Fulling, MD  olopatadine (PATADAY) 0.1 % ophthalmic solution Place 1 drop into both eyes 2 (two) times daily. 09/07/20   Glori Luis, MD  omeprazole (PRILOSEC) 20 MG capsule TAKE 1 CAPSULE BY MOUTH DAILY 01/22/21 02/13/22  Glori Luis, MD  rosuvastatin (CRESTOR) 20 MG tablet TAKE 1 TABLET BY MOUTH ONCE DAILY 10/04/21 10/04/22  Glori Luis, MD  sildenafil (VIAGRA) 100 MG tablet TAKE 1/2-1 TABLET (50-100 MG TOTAL)  BY MOUTH DAILY AS NEEDED FOR ERECTILE DYSFUNCTION. 10/04/21 10/04/22  Glori Luis, MD  umeclidinium-vilanterol (ANORO ELLIPTA) 62.5-25 MCG/ACT AEPB Inhale 1 puff into the lungs daily. 07/22/21   Erin Fulling, MD  umeclidinium-vilanterol (ANORO ELLIPTA) 62.5-25 MCG/ACT AEPB INHALE 1 PUFF BY MOUTH DAILY. 10/28/21   Erin Fulling, MD    Family History Family History  Problem Relation Age of Onset   Bladder Cancer Father    Cancer Paternal Aunt        stomach   Arthritis Other        Parent   Hyperlipidemia Other        Parent   Heart disease Other        Parent   Hypertension Other        Parent   Kidney disease Other        Parent    Social History Social History   Tobacco Use   Smoking status: Former    Packs/day: 2.00    Years: 30.00    Total pack years: 60.00    Types: Cigarettes    Quit date: 07/07/2015    Years since quitting: 6.3   Smokeless tobacco: Never  Vaping Use   Vaping Use: Never used  Substance Use Topics   Alcohol use: Yes    Alcohol/week: 0.0 standard drinks of alcohol    Comment: 1 Case of Beer / Weekly   Drug use: No     Allergies   Patient has no known allergies.   Review of Systems Review of Systems  Constitutional:  Negative for chills and fever.  Respiratory:  Negative for cough and shortness of breath.   Cardiovascular:  Negative for chest pain and palpitations.  Musculoskeletal:  Positive for arthralgias and neck pain. Negative for joint swelling.  Skin:  Negative for color change, rash and wound.  Neurological:  Negative for weakness and numbness.  All other systems reviewed and are negative.    Physical Exam Triage Vital Signs ED Triage Vitals  Enc Vitals Group     BP      Pulse      Resp      Temp      Temp src      SpO2      Weight      Height      Head Circumference      Peak Flow      Pain Score      Pain Loc      Pain Edu?      Excl. in GC?    No data found.  Updated Vital Signs BP (!) 145/60   Pulse 76    Temp 97.6 F (36.4 C)   Resp 18   Ht 5\' 9"  (1.753 m)   Wt 220 lb (99.8 kg)   SpO2 97%   BMI 32.49 kg/m   Visual Acuity Right Eye Distance:   Left Eye Distance:   Bilateral Distance:    Right Eye Near:   Left Eye Near:  Bilateral Near:     Physical Exam Vitals and nursing note reviewed.  Constitutional:      General: He is not in acute distress.    Appearance: He is well-developed. He is not ill-appearing.  HENT:     Mouth/Throat:     Mouth: Mucous membranes are moist.  Cardiovascular:     Rate and Rhythm: Normal rate and regular rhythm.     Heart sounds: Normal heart sounds.  Pulmonary:     Effort: Pulmonary effort is normal. No respiratory distress.     Breath sounds: Normal breath sounds.  Musculoskeletal:        General: No swelling, tenderness, deformity or signs of injury.     Cervical back: Neck supple.     Comments: Right shoulder has decreased ROM due to discomfort. RUE: 2+ pulses, strength 5/5, sensation intact.   Skin:    General: Skin is warm and dry.     Capillary Refill: Capillary refill takes less than 2 seconds.     Findings: No bruising, erythema, lesion or rash.  Neurological:     General: No focal deficit present.     Mental Status: He is alert and oriented to person, place, and time.     Sensory: No sensory deficit.     Motor: No weakness.  Psychiatric:        Mood and Affect: Mood normal.        Behavior: Behavior normal.      UC Treatments / Results  Labs (all labs ordered are listed, but only abnormal results are displayed) Labs Reviewed - No data to display  EKG   Radiology No results found.  Procedures Procedures (including critical care time)  Medications Ordered in UC Medications - No data to display  Initial Impression / Assessment and Plan / UC Course  I have reviewed the triage vital signs and the nursing notes.  Pertinent labs & imaging results that were available during my care of the patient were reviewed by  me and considered in my medical decision making (see chart for details).   Right shoulder pain.  Symptoms x 2 weeks.  Treating with ibuprofen and Flexeril.  Precautions for drowsiness with Flexeril discussed.  Instructed patient to follow up with orthopedist; contact information for on-call ortho provided.  Education provided on shoulder pain.  He agrees to plan of care.    Final Clinical Impressions(s) / UC Diagnoses   Final diagnoses:  Acute pain of right shoulder     Discharge Instructions      Take the ibuprofen as needed for discomfort.  Take the muscle relaxer as needed for muscle spasm; Do not drive, operate machinery, or drink alcohol with this medication as it can cause drowsiness.   Follow up with an orthopedist or your PCP if your symptoms are not improving.         ED Prescriptions     Medication Sig Dispense Auth. Provider   ibuprofen (ADVIL) 800 MG tablet Take 1 tablet (800 mg total) by mouth every 8 (eight) hours as needed. 21 tablet Sharion Balloon, NP   cyclobenzaprine (FLEXERIL) 10 MG tablet Take 1 tablet (10 mg total) by mouth 2 (two) times daily as needed for muscle spasms. 20 tablet Sharion Balloon, NP      I have reviewed the PDMP during this encounter.   Sharion Balloon, NP 11/08/21 1342

## 2021-11-08 NOTE — Discharge Instructions (Addendum)
Take the ibuprofen as needed for discomfort.  Take the muscle relaxer as needed for muscle spasm; Do not drive, operate machinery, or drink alcohol with this medication as it can cause drowsiness.   Follow up with an orthopedist or your PCP if your symptoms are not improving.

## 2021-11-09 ENCOUNTER — Other Ambulatory Visit: Payer: Self-pay | Admitting: Family Medicine

## 2021-11-09 DIAGNOSIS — N529 Male erectile dysfunction, unspecified: Secondary | ICD-10-CM

## 2021-11-11 ENCOUNTER — Other Ambulatory Visit: Payer: Self-pay

## 2021-11-11 MED ORDER — SILDENAFIL CITRATE 100 MG PO TABS
ORAL_TABLET | ORAL | 0 refills | Status: DC
  Filled 2021-11-11: qty 10, 30d supply, fill #0

## 2021-11-29 ENCOUNTER — Other Ambulatory Visit: Payer: Self-pay | Admitting: Family Medicine

## 2021-11-29 ENCOUNTER — Other Ambulatory Visit: Payer: Self-pay

## 2021-11-29 MED ORDER — OMEPRAZOLE 20 MG PO CPDR
20.0000 mg | DELAYED_RELEASE_CAPSULE | Freq: Every day | ORAL | 11 refills | Status: DC
  Filled 2021-11-29 – 2021-12-15 (×3): qty 30, 30d supply, fill #0
  Filled 2022-01-20: qty 30, 30d supply, fill #1
  Filled 2022-03-05: qty 30, 30d supply, fill #2
  Filled 2022-04-27: qty 30, 30d supply, fill #3
  Filled 2022-05-27: qty 30, 30d supply, fill #4
  Filled 2022-06-27: qty 30, 30d supply, fill #5
  Filled 2022-07-24: qty 30, 30d supply, fill #6
  Filled 2022-08-24: qty 30, 30d supply, fill #7
  Filled 2022-10-03: qty 30, 30d supply, fill #8
  Filled 2022-10-24 – 2022-11-03 (×2): qty 30, 30d supply, fill #9

## 2021-11-30 ENCOUNTER — Other Ambulatory Visit (HOSPITAL_COMMUNITY): Payer: Self-pay

## 2021-12-01 ENCOUNTER — Other Ambulatory Visit: Payer: Self-pay

## 2021-12-06 ENCOUNTER — Ambulatory Visit: Payer: Self-pay | Admitting: Internal Medicine

## 2021-12-08 ENCOUNTER — Other Ambulatory Visit: Payer: Self-pay

## 2021-12-08 ENCOUNTER — Other Ambulatory Visit: Payer: Self-pay | Admitting: Family Medicine

## 2021-12-08 DIAGNOSIS — E785 Hyperlipidemia, unspecified: Secondary | ICD-10-CM

## 2021-12-08 DIAGNOSIS — N529 Male erectile dysfunction, unspecified: Secondary | ICD-10-CM

## 2021-12-09 ENCOUNTER — Other Ambulatory Visit: Payer: Self-pay

## 2021-12-10 ENCOUNTER — Encounter: Payer: Self-pay | Admitting: Family Medicine

## 2021-12-15 ENCOUNTER — Other Ambulatory Visit: Payer: Self-pay | Admitting: Family Medicine

## 2021-12-15 ENCOUNTER — Other Ambulatory Visit: Payer: Self-pay

## 2021-12-15 DIAGNOSIS — N529 Male erectile dysfunction, unspecified: Secondary | ICD-10-CM

## 2021-12-16 ENCOUNTER — Other Ambulatory Visit: Payer: Self-pay

## 2021-12-16 ENCOUNTER — Other Ambulatory Visit: Payer: Self-pay | Admitting: Family Medicine

## 2021-12-16 DIAGNOSIS — N529 Male erectile dysfunction, unspecified: Secondary | ICD-10-CM

## 2021-12-17 ENCOUNTER — Other Ambulatory Visit: Payer: Self-pay

## 2021-12-18 ENCOUNTER — Other Ambulatory Visit: Payer: Self-pay

## 2021-12-19 ENCOUNTER — Encounter: Payer: Self-pay | Admitting: Family Medicine

## 2021-12-19 ENCOUNTER — Other Ambulatory Visit: Payer: Self-pay

## 2021-12-19 MED FILL — Sildenafil Citrate Tab 100 MG: ORAL | 30 days supply | Qty: 10 | Fill #0 | Status: AC

## 2021-12-20 ENCOUNTER — Other Ambulatory Visit: Payer: Self-pay

## 2021-12-23 ENCOUNTER — Telehealth: Payer: Self-pay | Admitting: Nurse Practitioner

## 2021-12-23 ENCOUNTER — Encounter: Payer: Self-pay | Admitting: Nurse Practitioner

## 2021-12-23 VITALS — Ht 69.0 in | Wt 220.0 lb

## 2022-01-02 ENCOUNTER — Other Ambulatory Visit: Payer: Self-pay | Admitting: Internal Medicine

## 2022-01-02 ENCOUNTER — Other Ambulatory Visit: Payer: Self-pay

## 2022-01-03 ENCOUNTER — Other Ambulatory Visit: Payer: Self-pay | Admitting: Family Medicine

## 2022-01-03 ENCOUNTER — Telehealth: Payer: Self-pay | Admitting: Internal Medicine

## 2022-01-03 ENCOUNTER — Other Ambulatory Visit: Payer: Self-pay

## 2022-01-03 DIAGNOSIS — E785 Hyperlipidemia, unspecified: Secondary | ICD-10-CM

## 2022-01-03 MED FILL — Umeclidinium-Vilanterol Aero Powd BA 62.5-25 MCG/ACT: RESPIRATORY_TRACT | 30 days supply | Qty: 60 | Fill #0 | Status: AC

## 2022-01-03 MED FILL — Rosuvastatin Calcium Tab 20 MG: ORAL | 90 days supply | Qty: 90 | Fill #0 | Status: AC

## 2022-01-03 NOTE — Telephone Encounter (Signed)
I spoke with Cindy(DNP), and told her the patient's PCP has sent in the script for him.  She will contact the pharmacy.  Nothing further needed.

## 2022-01-15 NOTE — Progress Notes (Signed)
Visit canceled

## 2022-01-20 ENCOUNTER — Other Ambulatory Visit: Payer: Self-pay

## 2022-01-20 ENCOUNTER — Ambulatory Visit: Payer: Self-pay

## 2022-01-20 ENCOUNTER — Other Ambulatory Visit: Payer: Self-pay | Admitting: Family Medicine

## 2022-01-20 ENCOUNTER — Other Ambulatory Visit: Payer: Self-pay | Admitting: Internal Medicine

## 2022-01-20 DIAGNOSIS — N529 Male erectile dysfunction, unspecified: Secondary | ICD-10-CM

## 2022-01-20 MED ORDER — SILDENAFIL CITRATE 100 MG PO TABS
ORAL_TABLET | ORAL | 0 refills | Status: DC
  Filled 2022-01-20: qty 10, 30d supply, fill #0

## 2022-01-20 MED ORDER — ANORO ELLIPTA 62.5-25 MCG/ACT IN AEPB: 1.0000 | INHALATION_SPRAY | Freq: Every day | RESPIRATORY_TRACT | 0 refills | Status: DC

## 2022-01-27 ENCOUNTER — Ambulatory Visit (INDEPENDENT_AMBULATORY_CARE_PROVIDER_SITE_OTHER): Payer: BC Managed Care – PPO | Admitting: Family Medicine

## 2022-01-27 ENCOUNTER — Encounter: Payer: Self-pay | Admitting: Family Medicine

## 2022-01-27 VITALS — BP 130/80 | HR 92 | Temp 97.8°F | Ht 69.0 in | Wt 224.4 lb

## 2022-01-27 DIAGNOSIS — Z125 Encounter for screening for malignant neoplasm of prostate: Secondary | ICD-10-CM

## 2022-01-27 DIAGNOSIS — E669 Obesity, unspecified: Secondary | ICD-10-CM

## 2022-01-27 DIAGNOSIS — Z0000 Encounter for general adult medical examination without abnormal findings: Secondary | ICD-10-CM

## 2022-01-27 DIAGNOSIS — Z114 Encounter for screening for human immunodeficiency virus [HIV]: Secondary | ICD-10-CM

## 2022-01-27 DIAGNOSIS — Z87891 Personal history of nicotine dependence: Secondary | ICD-10-CM

## 2022-01-27 DIAGNOSIS — E785 Hyperlipidemia, unspecified: Secondary | ICD-10-CM

## 2022-01-27 NOTE — Patient Instructions (Signed)
Nice to see you. We are going to get lab work and contact you with the results. The lung cancer screening people should contact you as well.  If you do not hear from them in the next several weeks please let us know. Please do not cut out alcohol suddenly.  Please cut down gradually over the next couple of weeks before cutting it out.

## 2022-01-27 NOTE — Assessment & Plan Note (Signed)
Physical exam completed.  I encouraged increasing the intensity of his exercise.  Discussed continue to work on cutting back on sweets intake.  Congratulated him on the changes he is already made.  PSA will be done for prostate cancer screening.  I will refer for lung cancer screening.  He declines flu vaccine and further COVID vaccinations.  He will check with his insurance regarding Shingrix coverage.  Discussed reducing alcohol intake.  Patient notes he has a plan to cut out alcohol completely in February.  Discussed given the volume of his drinking he needs to taper off of alcohol over the next several weeks prior to completely cutting it out given the risk of alcohol withdrawal and death from alcohol withdrawal.  Lab work as outlined.

## 2022-01-27 NOTE — Progress Notes (Signed)
Tommi Rumps, MD Phone: [phone]  Jimmy Howell is a 52 y.o. male who presents today for CPE.  Diet: eating healthier, has mostly cut soda out, less fried foods, is working on cutting down on sweets as well Exercise: walks 30-45 minutes 3x/week Cologuard: negative 05/03/20 Prostate cancer screening: due Family history-  Prostate cancer: no  Colon cancer: no Vaccines-   Flu: declines  Tetanus: 2017 per patient report  Shingles: due  COVID19: x2 HIV screening: due Hep C Screening: UTD Tobacco use: former, smoked 2.5 PPD for 30 years Alcohol use: notes 15 5.9% beers per week Illicit Drug use: no Dentist: yes Ophthalmology: yes   Active Ambulatory Problems    Diagnosis Date Noted   Lung nodules 02/19/2015   COPD (chronic obstructive pulmonary disease) ([place]) 02/19/2015   BPH (benign prostatic hyperplasia) 02/19/2015   Liver lesion 02/19/2015   Low back pain 03/22/2015   Acid reflux 07/09/2015   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   Degeneration of intervertebral disc of cervical region 11/21/2013   Cervical spinal stenosis 11/21/2013   Back ache 08/31/2013   Atherosclerosis of native coronary artery of native heart without angina pectoris 08/03/2015   Anxiety and depression 10/23/2015   Alcohol abuse 10/23/2015   Decreased sex drive 54/56/2563   Obesity (BMI 30.0-34.9) 06/17/2017   Fatty liver 06/17/2017   Hyperlipidemia 04/19/2018   History of 2019 novel coronavirus disease (COVID-19) 08/20/2018   Routine general medical examination at a health care facility 02/23/2020   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   Allergic conjunctivitis 09/07/2020   Resolved Ambulatory Problems    Diagnosis Date Noted   Tobacco abuse 02/19/2015   Tennis elbow 06/22/2015   Trapezius strain 06/22/2015   Asthma without status asthmaticus 07/09/2015   Lung mass 08/20/2015   Nodule of right lung    Pleural effusion    Pneumothorax, traumatic    Centrilobular  emphysema (HCC)    Cigarette smoker    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[place])    Mycobacterial disease, 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   Abdominal pain, right upper quadrant 10/23/2015   Rash and nonspecific skin eruption 01/10/2016   Past Medical History:  Diagnosis Date   Asthma    Chickenpox    GERD (gastroesophageal reflux disease)    Restless leg syndrome    Shortness of breath dyspnea     Family History  Problem Relation Age of Onset   Bladder Cancer Father    Cancer Paternal Aunt        stomach   Arthritis Other        Parent   Hyperlipidemia Other        Parent   Heart disease Other        Parent   Hypertension Other        Parent   Kidney disease Other        Parent    Social History   Socioeconomic History   Marital status: Married    Spouse name: Not on file   Number of children: Not on file   Years of education: Not on file   Highest education level: Not on file  Occupational History   Not on file  Tobacco Use   Smoking status: Former    Packs/day: 2.00    Years: 30.00    Total pack years: 60.00    Types: Cigarettes    Quit date: 07/07/2015    Years since quitting: 6.5   Smokeless tobacco: Never  Vaping Use   Vaping Use: Never  used  Substance and Sexual Activity   Alcohol use: Yes    Alcohol/week: 0.0 standard drinks of alcohol    Comment: 1 Case of Beer / Weekly   Drug use: No   Sexual activity: Not on file  Other Topics Concern   Not on file  Social History Narrative   Not on file   Social Determinants of Health   Financial Resource Strain: Not on file  Food Insecurity: Not on file  Transportation Needs: Not on file  Physical Activity: Not on file  Stress: Not on file  Social Connections: Not on file  Intimate Partner Violence: Not on file    ROS  General:  Negative for nexplained weight loss, fever Skin: Negative for new or changing mole, sore that won't heal HEENT: Negative for trouble hearing, trouble seeing, ringing in ears, mouth sores,  hoarseness, change in voice, dysphagia. CV:  Negative for chest pain, dyspnea, edema, palpitations Resp: Negative for cough, dyspnea, hemoptysis GI: Negative for nausea, vomiting, diarrhea, constipation, abdominal pain, melena, hematochezia. GU: Negative for dysuria, incontinence, urinary hesitance, hematuria, vaginal or penile discharge, polyuria, sexual difficulty, lumps in testicle or breasts MSK: Negative for muscle cramps or aches, joint pain or swelling Neuro: Negative for headaches, weakness, numbness, dizziness, passing out/fainting Psych: Negative for depression, anxiety, memory problems  Objective  Physical Exam Vitals:   01/27/22 1331 01/27/22 1351  BP: 130/80 130/80  Pulse: 92   Temp: 97.8 F (36.6 C)   SpO2: 95%     BP Readings from Last 3 Encounters:  01/27/22 130/80  11/08/21 (!) 145/60  12/21/20 120/70   Wt Readings from Last 3 Encounters:  01/27/22 224 lb 6.4 oz (101.8 kg)  12/23/21 220 lb (99.8 kg)  11/08/21 220 lb (99.8 kg)    Physical Exam Constitutional:      General: He is not in acute distress.    Appearance: He is not diaphoretic.  HENT:     Head: Normocephalic and atraumatic.  Cardiovascular:     Rate and Rhythm: Normal rate and regular rhythm.     Heart sounds: Normal heart sounds.  Pulmonary:     Effort: Pulmonary effort is normal.     Breath sounds: Normal breath sounds.  Abdominal:     General: Bowel sounds are normal. There is no distension.     Palpations: Abdomen is soft.     Tenderness: There is no abdominal tenderness.  Musculoskeletal:     Right lower leg: No edema.     Left lower leg: No edema.  Lymphadenopathy:     Cervical: No cervical adenopathy.  Skin:    General: Skin is warm and dry.  Neurological:     Mental Status: He is alert.  Psychiatric:        Mood and Affect: Mood normal.      Assessment/Plan:   Routine general medical examination at a health care facility Assessment & Plan: Physical exam completed.   I encouraged increasing the intensity of his exercise.  Discussed continue to work on cutting back on sweets intake.  Congratulated him on the changes he is already made.  PSA will be done for prostate cancer screening.  I will refer for lung cancer screening.  He declines flu vaccine and further COVID vaccinations.  He will check with his insurance regarding Shingrix coverage.  Discussed reducing alcohol intake.  Patient notes he has a plan to cut out alcohol completely in February.  Discussed given the volume of his drinking he needs  to taper off of alcohol over the next several weeks prior to completely cutting it out given the risk of alcohol withdrawal and death from alcohol withdrawal.  Lab work as outlined.   Hyperlipidemia, unspecified hyperlipidemia type -     Comprehensive metabolic panel -     Lipid panel  Obesity (BMI 30.0-34.9) -     Hemoglobin A1c  Prostate cancer screening -     PSA  Encounter for screening for HIV -     HIV Antibody (routine testing w rflx)  Former smoker -     Ambulatory Referral for Lung Cancer Scre    Return in about 3 months (around 04/28/2022) for Weight.   Marikay Alar, MD [facility]

## 2022-01-28 LAB — LIPID PANEL
Cholesterol: 109 mg/dL (ref 0–200)
HDL: 38.2 mg/dL — ABNORMAL LOW (ref >39.00)
LDL Cholesterol: 48 mg/dL (ref 0–99)
NonHDL: 70.99
Total CHOL/HDL Ratio: 3
Triglycerides: 113 mg/dL (ref 0.0–149.0)
VLDL: 22.6 mg/dL (ref 0.0–40.0)

## 2022-01-28 LAB — HEMOGLOBIN A1C: Hgb A1c MFr Bld: 5.9 % (ref 4.6–6.5)

## 2022-01-28 LAB — COMPREHENSIVE METABOLIC PANEL
ALT: 36 U/L (ref 0–53)
AST: 20 U/L (ref 0–37)
Albumin: 4.6 g/dL (ref 3.5–5.2)
Alkaline Phosphatase: 51 U/L (ref 39–117)
BUN: 13 mg/dL (ref 6–23)
CO2: 26 mEq/L (ref 19–32)
Calcium: 9.5 mg/dL (ref 8.4–10.5)
Chloride: 103 mEq/L (ref 96–112)
Creatinine, Ser: 0.7 mg/dL (ref 0.40–1.50)
GFR: 106.32 mL/min (ref >60.00)
Glucose, Bld: 95 mg/dL (ref 70–99)
Potassium: 3.9 mEq/L (ref 3.5–5.1)
Sodium: 140 mEq/L (ref 135–145)
Total Bilirubin: 0.5 mg/dL (ref 0.2–1.2)
Total Protein: 7 g/dL (ref 6.0–8.3)

## 2022-01-28 LAB — PSA: PSA: 0.79 ng/mL (ref 0.10–4.00)

## 2022-01-28 LAB — HIV ANTIBODY (ROUTINE TESTING W REFLEX): HIV 1&2 Ab, 4th Generation: NONREACTIVE

## 2022-01-31 ENCOUNTER — Other Ambulatory Visit: Payer: Self-pay | Admitting: Internal Medicine

## 2022-01-31 ENCOUNTER — Other Ambulatory Visit: Payer: Self-pay

## 2022-01-31 MED ORDER — ANORO ELLIPTA 62.5-25 MCG/ACT IN AEPB
1.0000 | INHALATION_SPRAY | Freq: Every day | RESPIRATORY_TRACT | 1 refills | Status: DC
  Filled 2022-01-31: qty 60, 30d supply, fill #0
  Filled 2022-02-16: qty 60, 30d supply, fill #1

## 2022-02-03 ENCOUNTER — Encounter: Payer: Self-pay | Admitting: Family Medicine

## 2022-02-04 ENCOUNTER — Telehealth: Payer: Self-pay | Admitting: Family Medicine

## 2022-02-04 NOTE — Telephone Encounter (Signed)
Patient was called and scheduled to be seen tomorrow for his back.  Dominga Mcduffie,cma

## 2022-02-04 NOTE — Telephone Encounter (Signed)
Pt called in to make an appt with Dr. Caryl Bis for back pain, however Dr. Caryl Bis its booked until 02/14. As per pt that's too far out, and he would like to know if provider can put an order for xrays for now??? Or any appt sooner?

## 2022-02-05 ENCOUNTER — Ambulatory Visit: Payer: BC Managed Care – PPO | Admitting: Family Medicine

## 2022-02-05 ENCOUNTER — Ambulatory Visit
Admission: EM | Admit: 2022-02-05 | Discharge: 2022-02-05 | Disposition: A | Discharge status: Home / Self Care | Payer: BC Managed Care – PPO | Attending: Emergency Medicine | Admitting: Emergency Medicine

## 2022-02-05 ENCOUNTER — Other Ambulatory Visit: Payer: Self-pay

## 2022-02-05 DIAGNOSIS — M5442 Lumbago with sciatica, left side: Secondary | ICD-10-CM | POA: Diagnosis not present

## 2022-02-05 MED ORDER — PREDNISONE 10 MG PO TABS
ORAL_TABLET | ORAL | 0 refills | Status: AC
  Filled 2022-02-05: qty 21, 6d supply, fill #0

## 2022-02-05 NOTE — ED Triage Notes (Signed)
Patient to Urgent Care with complaints of lower back pain that started one week ago. Pain radiates down into left leg to foot.  Denies any known injury. Reports repetitive motions of bending at work. Difficulty standing for extended periods of time.   Has been taking tylenol, ibuprofen 800mg , lidocaine, muscle relaxer.

## 2022-02-05 NOTE — Discharge Instructions (Addendum)
Take the prednisone as directed.    Follow up with your primary care provider or an orthopedist or a neurosurgeon.

## 2022-02-05 NOTE — ED Provider Notes (Signed)
Jimmy Howell    CSN: [id number] Arrival date & time: 02/05/22  1502      History   Chief Complaint Chief Complaint  Patient presents with   Back Pain    HPI Jimmy Howell is a 52 y.o. male.   Patient presents with left lower back pain x 1 week.  The pain radiates down his left leg.  No falls or injury.  He reports intermittent numbness when he stands for long periods.  The pain is worse with standing and ambulation; improves with rest.  No weakness, saddle anesthesia, loss of bowel/bladder control, abdominal pain, dysuria, hematuria, or other symptoms.  Treatment attempted with ibuprofen, Tylenol, topical lidocaine, Flexeril.   His medical history includes DDD, cervical spinal stenosis, low back pain, alcohol abuse, fatty liver, COPD, lung nodules, hyperlipidemia, anxiety, depression, restless leg syndrome.   The history is provided by the patient and medical records.    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 Patient like assistance and wants to use Wellbutrin. We'll start on Wellbutrin 150 mg daily for 3 days and then increase to 150 mg twice a day.    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   Low back pain 03/22/2015   Lung nodules 02/19/2015   COPD (chronic obstructive pulmonary disease) ([place]) 02/19/2015   BPH (benign prostatic hyperplasia) 02/19/2015   Liver lesion 02/19/2015   Degeneration of intervertebral disc of cervical region 11/21/2013   Cervical spinal stenosis 11/21/2013   Back ache 08/31/2013    Past Surgical History:  Procedure Laterality Date   ESOPHAGOGASTRODUODENOSCOPY (EGD) WITH PROPOFOL N/A 08/10/2020   Procedure: ESOPHAGOGASTRODUODENOSCOPY (EGD) WITH PROPOFOL;  Surgeon: Jonathon Bellows, MD;  Location: [facility] ENDOSCOPY;  Service: Gastroenterology;  Laterality: N/A;   FINGER SURGERY     HERNIA REPAIR     Inguinal Hernia Repair   VIDEO ASSISTED THORACOSCOPY (VATS)/THOROCOTOMY Right 08/20/2015   Procedure: PREOP BRONCH, THORACOTOMY, RIGHT UPPER LOBECTOMY;  Surgeon: Nestor Lewandowsky, MD;  Location: [facility] ORS;  Service: General;  Laterality: Right;       Home Medications    Prior to Admission medications   Medication Sig Start Date End Date Taking? Authorizing Provider  predniSONE (DELTASONE) 10 MG tablet Take 6 tablets (60 mg total) by mouth daily for 1 day, THEN 5 tablets (50 mg total) daily for 1 day, THEN 4 tablets (40 mg total) daily for 1 day, THEN 3 tablets (30 mg total) daily for 1 day, THEN 2 tablets (20 mg total) daily for 1 day, THEN 1 tablet (10 mg  total) daily for 1 day. 02/05/22 02/11/22 Yes Mickie Bail, NP  albuterol (VENTOLIN HFA) 108 (90 Base) MCG/ACT inhaler INHALE 2 PUFFS INTO THE LUNGS EVERY 4 HOURS AS NEEDED FOR WHEEZING OR SHORTNESS OF BREATH. 08/14/21 08/14/22  Erin Fulling, MD  ibuprofen (ADVIL) 800 MG tablet Take 1 tablet (800 mg total) by mouth every 8 (eight) hours as needed. 11/08/21   Mickie Bail, NP  olopatadine (PATADAY) 0.1 % ophthalmic solution Place 1 drop into both eyes 2 (two)  times daily. 09/07/20   Glori Luis, MD  omeprazole (PRILOSEC) 20 MG capsule Take 1 capsule (20 mg total) by mouth daily. 11/29/21 11/29/22  Glori Luis, MD  rosuvastatin (CRESTOR) 20 MG tablet TAKE 1 TABLET BY MOUTH ONCE DAILY 01/03/22 01/03/23  Glori Luis, MD  sildenafil (VIAGRA) 100 MG tablet TAKE 1/2-1 TABLET (50-100 MG TOTAL) BY MOUTH DAILY AS NEEDED FOR ERECTILE DYSFUNCTION. 01/20/22 01/20/23  Glori Luis, MD  umeclidinium-vilanterol (ANORO ELLIPTA) 62.5-25 MCG/ACT AEPB Inhale 1 puff into the lungs daily. 01/31/22   Erin Fulling, MD    Family History Family History  Problem Relation Age of Onset   Bladder Cancer Father    Cancer Paternal Aunt        stomach   Arthritis Other        Parent   Hyperlipidemia Other        Parent   Heart disease Other        Parent   Hypertension Other        Parent   Kidney disease Other        Parent    Social History Social History   Tobacco Use   Smoking status: Former    Packs/day: 2.00    Years: 30.00    Total pack years: 60.00    Types: Cigarettes    Quit date: 07/07/2015    Years since quitting: 6.5   Smokeless tobacco: Never  Vaping Use   Vaping Use: Never used  Substance Use Topics   Alcohol use: Yes    Alcohol/week: 0.0 standard drinks of alcohol    Comment: 1 Case of Beer / Weekly   Drug use: No     Allergies   Patient has no known allergies.   Review of Systems Review of Systems  Constitutional:  Negative for chills and fever.  Gastrointestinal:  Negative for abdominal pain, diarrhea, nausea and vomiting.  Genitourinary:  Negative for dysuria and hematuria.  Musculoskeletal:  Positive for back pain. Negative for arthralgias, gait problem and joint swelling.  Skin:  Negative for color change, rash and wound.  Neurological:  Positive for numbness. Negative for weakness.  All other systems reviewed and are negative.    Physical Exam Triage Vital Signs ED Triage Vitals  Enc Vitals Group      BP      Pulse      Resp      Temp      Temp src      SpO2      Weight      Height      Head Circumference      Peak Flow      Pain Score      Pain Loc      Pain Edu?      Excl. in GC?    No data found.  Updated Vital Signs BP 134/83   Pulse 79   Temp 97.7 F (36.5 C)   Resp 18  SpO2 95%   Visual Acuity Right Eye Distance:   Left Eye Distance:   Bilateral Distance:    Right Eye Near:   Left Eye Near:    Bilateral Near:     Physical Exam Vitals and nursing note reviewed.  Constitutional:      General: He is not in acute distress.    Appearance: He is well-developed. He is not ill-appearing.  HENT:     Mouth/Throat:     Mouth: Mucous membranes are moist.  Cardiovascular:     Rate and Rhythm: Normal rate and regular rhythm.     Heart sounds: Normal heart sounds.  Pulmonary:     Effort: Pulmonary effort is normal. No respiratory distress.     Breath sounds: Normal breath sounds.  Abdominal:     General: Bowel sounds are normal.     Palpations: Abdomen is soft.     Tenderness: There is no abdominal tenderness. There is no right CVA tenderness, left CVA tenderness, guarding or rebound.  Musculoskeletal:        General: No swelling, tenderness, deformity or signs of injury. Normal range of motion.     Cervical back: Neck supple.  Skin:    General: Skin is warm and dry.     Capillary Refill: Capillary refill takes less than 2 seconds.     Findings: No bruising, erythema, lesion or rash.  Neurological:     General: No focal deficit present.     Mental Status: He is alert and oriented to person, place, and time.     Sensory: No sensory deficit.     Motor: No weakness.     Gait: Gait normal.  Psychiatric:        Mood and Affect: Mood normal.        Behavior: Behavior normal.      UC Treatments / Results  Labs (all labs ordered are listed, but only abnormal results are displayed) Labs Reviewed - No data to display  EKG   Radiology No results  found.  Procedures Procedures (including critical care time)  Medications Ordered in UC Medications - No data to display  Initial Impression / Assessment and Plan / UC Course  I have reviewed the triage vital signs and the nursing notes.  Pertinent labs & imaging results that were available during my care of the patient were reviewed by me and considered in my medical decision making (see chart for details).    Left lower back pain with left side sciatica.  Patient has been taking ibuprofen and Flexeril that he had left over from previous shoulder issue.  Treating today with prednisone taper.  Education provided on sciatica.  Instructed patient to follow up with his PCP or an orthopedist or a neurosurgeon.  Contact information for on-call ortho and neurosurgeon provided to patient.  Work note provided per patient request.  He agrees to plan of care.    Final Clinical Impressions(s) / UC Diagnoses   Final diagnoses:  Acute left-sided low back pain with left-sided sciatica     Discharge Instructions      Take the prednisone as directed.    Follow up with your primary care provider or an orthopedist or a neurosurgeon.        ED Prescriptions     Medication Sig Dispense Auth. Provider   predniSONE (DELTASONE) 10 MG tablet Take 6 tablets (60 mg total) by mouth daily for 1 day, THEN 5 tablets (50 mg total) daily for 1 day, THEN  4 tablets (40 mg total) daily for 1 day, THEN 3 tablets (30 mg total) daily for 1 day, THEN 2 tablets (20 mg total) daily for 1 day, THEN 1 tablet (10 mg total) daily for 1 day. 21 tablet Sharion Balloon, NP      I have reviewed the PDMP during this encounter.   Sharion Balloon, NP 02/05/22 [phone]

## 2022-02-12 NOTE — Progress Notes (Unsigned)
Referring Physician:  Sharion Balloon, NP [address] [place],  [place] [address]  Primary Physician:  Leone Haven, MD  History of Present Illness: 02/13/2022 [name] has a history of CAD, COPD, fatty liver, BPH, hyperlipidemia, alcohol abuse.   Seen at [facility] on 02/05/22 for left sided LBP with left leg pain x 1 week.   2 week history of constant LBP with constant left lateral leg pain to his foot. No right leg pain. LBP < left leg pain. He has numbness in his leg with standing. Pain is worse when he gets up from seated position. Pain is also worse with walking. No weakness in legs. No known injury. No previous back issues.   He was given prednisone taper from UC- this did not help. Not much relief with motrin.   Bowel/Bladder Dysfunction: none  He does not smoke.   Conservative measures:  Physical therapy: no  Multimodal medical therapy including regular antiinflammatories: motrin, tylenol, topical lidocaine, flexeril, prednisone Injections: No epidural steroid injections  Past Surgery: no spinal surgery  Jimmy Howell has no symptoms of cervical myelopathy.  The symptoms are causing a significant impact on the patient's life.   Review of Systems:  A 10 point review of systems is negative, except for the pertinent positives and negatives detailed in the HPI.  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 Patient like assistance and wants to use Wellbutrin. We'll start on Wellbutrin 150 mg daily for 3 days and then increase to 150 mg twice a day.    Past Surgical History: Past Surgical  History:  Procedure Laterality Date   ESOPHAGOGASTRODUODENOSCOPY (EGD) WITH PROPOFOL N/A 08/10/2020   Procedure: ESOPHAGOGASTRODUODENOSCOPY (EGD) WITH PROPOFOL;  Surgeon: Jonathon Bellows, MD;  Location: [facility] ENDOSCOPY;  Service: Gastroenterology;  Laterality: N/A;   FINGER SURGERY     HERNIA REPAIR     Inguinal Hernia Repair   VIDEO ASSISTED THORACOSCOPY (VATS)/THOROCOTOMY Right 08/20/2015   Procedure: PREOP BRONCH, THORACOTOMY, RIGHT UPPER LOBECTOMY;  Surgeon: Nestor Lewandowsky, MD;  Location: [facility] ORS;  Service: General;  Laterality: Right;    Allergies: Allergies as of 02/13/2022   (No Known Allergies)    Medications: Outpatient Encounter Medications as of 02/13/2022  Medication Sig   albuterol (VENTOLIN HFA) 108 (90 Base) MCG/ACT inhaler INHALE 2 PUFFS INTO THE LUNGS EVERY 4 HOURS AS NEEDED FOR WHEEZING OR SHORTNESS OF BREATH.   ibuprofen (ADVIL) 800 MG tablet Take 1 tablet (800 mg total) by mouth every 8 (eight) hours as needed.   olopatadine (PATADAY) 0.1 % ophthalmic solution Place 1 drop into both eyes 2 (two) times daily.   omeprazole (PRILOSEC) 20 MG capsule Take 1 capsule (20 mg total) by mouth daily.   rosuvastatin (CRESTOR) 20 MG tablet TAKE 1 TABLET BY MOUTH ONCE DAILY   sildenafil (VIAGRA) 100 MG tablet TAKE 1/2-1 TABLET (50-100 MG TOTAL) BY MOUTH DAILY AS NEEDED FOR ERECTILE DYSFUNCTION.   umeclidinium-vilanterol (ANORO ELLIPTA) 62.5-25 MCG/ACT AEPB Inhale 1 puff into the lungs daily.   Facility-Administered Encounter Medications as of 02/13/2022  Medication  ipratropium-albuterol (DUONEB) 0.5-2.5 (3) MG/3ML nebulizer solution 3 mL    Social History: Social History   Tobacco Use   Smoking status: Former    Packs/day: 2.00    Years: 30.00    Total pack years: 60.00    Types: Cigarettes    Quit date: 07/07/2015    Years since quitting: 6.6   Smokeless tobacco: Never  Vaping Use   Vaping Use: Never used  Substance Use Topics   Alcohol use: Yes    Alcohol/week: 0.0 standard  drinks of alcohol    Comment: 1 Case of Beer / Weekly   Drug use: No    Family Medical History: Family History  Problem Relation Age of Onset   Bladder Cancer Father    Cancer Paternal Aunt        stomach   Arthritis Other        Parent   Hyperlipidemia Other        Parent   Heart disease Other        Parent   Hypertension Other        Parent   Kidney disease Other        Parent    Physical Examination: Vitals:   02/13/22 0918  BP: 130/76    General: Patient is well developed, well nourished, calm, collected, and in no apparent distress. Attention to examination is appropriate.  Respiratory: Patient is breathing without any difficulty.   NEUROLOGICAL:     Awake, alert, oriented to person, place, and time.  Speech is clear and fluent. Fund of knowledge is appropriate.   Cranial Nerves: Pupils equal round and reactive to light.  Facial tone is symmetric.    Limited ROM of lumbar spine with pain Mild left sided posterior lumbar tenderness.   No abnormal lesions on exposed skin.   Strength: Side Biceps Triceps Deltoid Interossei Grip Wrist Ext. Wrist Flex.  R 5 5 5 5 5 5 5   L 5 5 5 5 5 5 5    Side Iliopsoas Quads Hamstring PF DF EHL  R 5 5 5 5 5 5   L 5 5 5 5 5 5    Reflexes are 2+ and symmetric at the biceps, triceps, brachioradialis, patella and achilles.   Hoffman's is absent.  Clonus is not present.   Bilateral upper and lower extremity sensation is intact to light touch.     Normal gait with slight limp favoring left leg.   Medical Decision Making  Imaging: none  Assessment and Plan: [name] is a pleasant 52 y.o. male has a 2 week history of constant LBP with constant left lateral leg pain to his foot. No right leg pain. LBP < left leg pain.   No lumbar imaging done. Symptoms appear to be lumbar mediated.   Treatment options discussed with patient and following plan made:   - Lumbar xrays ordered. Will message him with results.  - Order for  physical therapy for lumbar spine sent to [facility]. Patient to call to schedule appointment.  - Prescription for mobic. Reviewed dosing and side effects. Take with food. Stop motrin.  - Prescription for robaxin to take prn muscle spasms. Reviewed dosing and side effects. Discussed this can cause drowsiness.  - If no improvement with above, consider lumbar MRI.  - Follow up with me in 6 weeks and prn.   I spent a total of 35 minutes in face-to-face and non-face-to-face activities related to this patient's care today including review of outside records, review of  imaging, review of symptoms, physical exam, discussion of differential diagnosis, discussion of treatment options, and documentation.   Thank you for involving me in the care of this patient.   Geronimo Boot PA-C Dept. of Neurosurgery

## 2022-02-13 ENCOUNTER — Other Ambulatory Visit: Payer: Self-pay

## 2022-02-13 ENCOUNTER — Encounter: Payer: Self-pay | Admitting: Orthopedic Surgery

## 2022-02-13 ENCOUNTER — Ambulatory Visit: Payer: BC Managed Care – PPO | Admitting: Orthopedic Surgery

## 2022-02-13 ENCOUNTER — Ambulatory Visit
Admission: RE | Admit: 2022-02-13 | Discharge: 2022-02-13 | Disposition: A | Discharge status: Home / Self Care | Payer: BC Managed Care – PPO | Attending: Orthopedic Surgery | Admitting: Orthopedic Surgery

## 2022-02-13 ENCOUNTER — Ambulatory Visit
Admission: RE | Admit: 2022-02-13 | Discharge: 2022-02-13 | Disposition: A | Discharge status: Home / Self Care | Payer: BC Managed Care – PPO | Source: Ambulatory Visit | Attending: Orthopedic Surgery | Admitting: Orthopedic Surgery

## 2022-02-13 VITALS — BP 130/76 | Ht 69.0 in | Wt 216.0 lb

## 2022-02-13 DIAGNOSIS — M5442 Lumbago with sciatica, left side: Secondary | ICD-10-CM | POA: Diagnosis present

## 2022-02-13 DIAGNOSIS — M5416 Radiculopathy, lumbar region: Secondary | ICD-10-CM | POA: Insufficient documentation

## 2022-02-13 MED ORDER — MELOXICAM 15 MG PO TABS
15.0000 mg | ORAL_TABLET | Freq: Every day | ORAL | 2 refills | Status: DC
  Filled 2022-02-13: qty 30, 30d supply, fill #0
  Filled 2022-03-03: qty 30, 30d supply, fill #1
  Filled 2022-04-08: qty 30, 30d supply, fill #2

## 2022-02-13 MED ORDER — METHOCARBAMOL 500 MG PO TABS
500.0000 mg | ORAL_TABLET | Freq: Three times a day (TID) | ORAL | 0 refills | Status: DC | PRN
  Filled 2022-02-13: qty 30, 10d supply, fill #0

## 2022-02-13 NOTE — Patient Instructions (Addendum)
It was so nice to see you today. Thank you so much for coming in.    I ordered xrays of your lower back. You can go across the street to get these at [facility] (building with the white pillars). You do not need any appointment. I will message you with results.   I sent physical therapy orders to [facility] in [place]. You can call them at [phone] if you don't hear from them to schedule your visit.   I sent a prescription for meloxicam to help with pain and inflammation. Take as directed with food. Stop the motrin.  I sent a prescription for methocarbamol to use as needed for muscle spasms. Take as needed, it may make you sleepy.   I will see you back in 6 weeks. Please do not hesitate to call if you have any questions or concerns. You can also message me in [place].   Geronimo Boot PA-C [phone]

## 2022-02-14 ENCOUNTER — Other Ambulatory Visit: Payer: Self-pay

## 2022-02-16 ENCOUNTER — Other Ambulatory Visit: Payer: Self-pay | Admitting: Family Medicine

## 2022-02-16 ENCOUNTER — Other Ambulatory Visit: Payer: Self-pay | Admitting: Internal Medicine

## 2022-02-16 DIAGNOSIS — N529 Male erectile dysfunction, unspecified: Secondary | ICD-10-CM

## 2022-02-17 ENCOUNTER — Encounter: Payer: Self-pay | Admitting: Orthopedic Surgery

## 2022-02-17 ENCOUNTER — Other Ambulatory Visit: Payer: Self-pay

## 2022-02-17 MED ORDER — ALBUTEROL SULFATE HFA 108 (90 BASE) MCG/ACT IN AERS
2.0000 | INHALATION_SPRAY | RESPIRATORY_TRACT | 0 refills | Status: DC | PRN
  Filled 2022-02-17: qty 6.7, 25d supply, fill #0

## 2022-02-17 NOTE — Telephone Encounter (Signed)
Lumbar xrays dated 02/13/22:  FINDINGS: Non-rib-bearing lumbar vertebrae. Mild levoconvex lumbar rotary scoliosis. Moderate to marked disc space narrowing with mild anterior and moderate posterior spur formation at the L5-S1 level. Mild anterior spur formation at the L3-4 and L4-5 levels. Mild facet degenerative changes at the L4-5 and L5-S1 levels. No pars defects seen. Atheromatous arterial calcifications.   Grade 1 retrolisthesis at the L5-S1 level measuring 3 mm in the neutral standing position, 1 mm with flexion and 3 mm with extension.   There is also 5 mm of retrolisthesis at the L4-5 level with extension, 3 mm in the neutral position and not seen with flexion.   IMPRESSION: 1. Multilevel degenerative changes, as described above. 2. Grade 1 retrolisthesis at the L5-S1 level measuring 3 mm in the neutral position, 1 mm with flexion and 3 mm with extension. 3. 5 mm of retrolisthesis at the L4-5 level with extension, 3 mm in the neutral position and none with flexion.     Electronically Signed   By: Claudie Revering M.D.   On: 02/14/2022 22:44   I have personally reviewed the images and agree with the above interpretation.  Hard to tell if he has movement on flex/ext at L4-L5 and L5-S1.   Message sent to patient. No change to plan made at his visit.

## 2022-02-18 ENCOUNTER — Other Ambulatory Visit: Payer: Self-pay

## 2022-02-18 MED ORDER — SILDENAFIL CITRATE 100 MG PO TABS
50.0000 mg | ORAL_TABLET | Freq: Every day | ORAL | 0 refills | Status: DC | PRN
  Filled 2022-02-18: qty 10, 10d supply, fill #0

## 2022-02-19 ENCOUNTER — Encounter: Payer: Self-pay | Admitting: Internal Medicine

## 2022-02-19 ENCOUNTER — Ambulatory Visit: Payer: BC Managed Care – PPO | Admitting: Internal Medicine

## 2022-02-19 VITALS — BP 140/88 | HR 63 | Temp 97.7°F | Ht 69.0 in | Wt 216.6 lb

## 2022-02-19 DIAGNOSIS — Z87891 Personal history of nicotine dependence: Secondary | ICD-10-CM

## 2022-02-19 DIAGNOSIS — J449 Chronic obstructive pulmonary disease, unspecified: Secondary | ICD-10-CM | POA: Diagnosis not present

## 2022-02-19 NOTE — Patient Instructions (Signed)
Continue inhalers as prescribed  Recommend Lung Cancer Screening program  Avoid secondhand smoke Avoid SICK contacts Recommend  Masking  when appropriate Recommend Keep up-to-date with vaccinations

## 2022-02-19 NOTE — Progress Notes (Signed)
[facility] Consultation      Date: 02/19/2022,   MRN# [id number] Jimmy Howell [birth year]     AdmissionWeight: 216 lb 9.6 oz (98.2 kg)                 CurrentWeight: 216 lb 9.6 oz (98.2 kg) Jimmy Howell is a 52 y.o. old male seen in consultation for COPD and lung nodules at the request of Dr. 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, h/o tobacco abuse and COPD   PFT reviewed 04/2015 Ratio 61% FEV1 54% FVC 69% RV 324% TLC 154% Moderate COPD    CHIEF COMPLAINT:   Follow-up COPD Previous history of MAI pulmonary infection Previous longstanding smoking history    HISTORY OF PRESENT ILLNESS   Chronic shortness of breath and dyspnea on exertion stable Quit smoking 5 years ago Continues to take Anoro inhaler Helps his breathing really well  No exacerbation at this time No evidence of heart failure at this time No evidence or signs of infection at this time No respiratory distress No fevers, chills, nausea, vomiting, diarrhea No evidence of lower extremity edema No evidence hemoptysis  Albuterol as needed COPD under control   Enrolled in the lung cancer screening program Previous CT scan March 2022-no acute findings   Original weight was 170 now weighs approximately 216 pounds     Current Outpatient Medications:    albuterol (VENTOLIN HFA) 108 (90 Base) MCG/ACT inhaler, INHALE 2 PUFFS INTO THE LUNGS EVERY 4 HOURS AS NEEDED FOR WHEEZING OR SHORTNESS OF BREATH., Disp: 6.7 g, Rfl: 0   meloxicam (MOBIC) 15 MG tablet, Take 1 tablet (15 mg total) by mouth daily. Take with food., Disp: 30 tablet, Rfl: 2   methocarbamol (ROBAXIN) 500 MG tablet, Take 1 tablet (500 mg total) by mouth every 8 (eight) hours as needed for muscle spasms., Disp: 30 tablet, Rfl: 0   olopatadine (PATADAY) 0.1 % ophthalmic solution, Place 1 drop into both eyes 2 (two) times daily.,  Disp: 5 mL, Rfl: 12   omeprazole (PRILOSEC) 20 MG capsule, Take 1 capsule (20 mg total) by mouth daily., Disp: 30 capsule, Rfl: 11   rosuvastatin (CRESTOR) 20 MG tablet, TAKE 1 TABLET BY MOUTH ONCE DAILY, Disp: 90 tablet, Rfl: 0   sildenafil (VIAGRA) 100 MG tablet, Take 0.5-1 tablets (50-100 mg total) by mouth daily as needed for erectile dysfucntion., Disp: 10 tablet, Rfl: 0   umeclidinium-vilanterol (ANORO ELLIPTA) 62.5-25 MCG/ACT AEPB, Inhale 1 puff into the lungs daily., Disp: 60 each, Rfl: 1  Current Facility-Administered Medications:    ipratropium-albuterol (DUONEB) 0.5-2.5 (3) MG/3ML nebulizer solution 3 mL, 3 mL, Nebulization, Q6H, Arnett, Yvetta Coder, FNP, 3 mL at 01/01/18 1024     REVIEW OF SYSTEMS   Review of Systems  Constitutional:  Negative for chills, fever, malaise/fatigue and weight loss.  HENT:  Negative for congestion.   Respiratory:  Positive for shortness of breath. Negative for cough, hemoptysis, sputum production and wheezing.   Cardiovascular:  Negative for chest pain, palpitations, orthopnea and leg swelling.  Gastrointestinal:  Negative for heartburn and nausea.  Neurological:  Negative for tingling.  Psychiatric/Behavioral:  The patient is not nervous/anxious.   All other systems reviewed and are negative.   .vs.vs    PHYSICAL EXAM  Physical Exam Constitutional:      General: He is not in acute distress.    Appearance: He is not diaphoretic.  Cardiovascular:  Rate and Rhythm: Normal rate and regular rhythm.     Heart sounds: Normal heart sounds. No murmur heard. Pulmonary:     Effort: Pulmonary effort is normal. No respiratory distress.     Breath sounds: Normal breath sounds. No stridor. No wheezing or rales.  Skin:    General: Skin is warm.  Neurological:     Mental Status: He is alert and oriented to person, place, and time.     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 along with 9 months of medical therapy   COPD assessment Stable No indications for prednisone or antibiotics at this time Avoid secondhand smoke Avoid SICK contacts Recommend  Masking  when appropriate Recommend Keep up-to-date with vaccinations Continue inhalers as prescribed   Obesity -recommend significant weight loss -recommend changing diet  Deconditioned state -Recommend increased daily activity and exercise  Lung cancer screening program Follow up annually  MEDICATION ADJUSTMENTS/LABS AND TESTS ORDERED: ANORO as  prescribed Albuterol as needed Lung cancer screening program CURRENT MEDICATIONS REVIEWED AT LENGTH WITH PATIENT TODAY   Patient satisfied with Plan of action and management. All questions answered  Follow-up 1 year  Total time spent 22 mins   Uzoma Vivona Patricia Pesa, M.D.  [facility]  Medical Director [place] Director [facility] Cardio-Pulmonary Department

## 2022-02-24 ENCOUNTER — Other Ambulatory Visit: Payer: Self-pay

## 2022-02-26 ENCOUNTER — Ambulatory Visit: Payer: BC Managed Care – PPO | Attending: Orthopedic Surgery

## 2022-02-26 DIAGNOSIS — M5416 Radiculopathy, lumbar region: Secondary | ICD-10-CM | POA: Insufficient documentation

## 2022-02-26 DIAGNOSIS — M5442 Lumbago with sciatica, left side: Secondary | ICD-10-CM | POA: Diagnosis present

## 2022-02-26 DIAGNOSIS — M5459 Other low back pain: Secondary | ICD-10-CM | POA: Diagnosis present

## 2022-02-26 NOTE — Therapy (Signed)
OUTPATIENT PHYSICAL THERAPY THORACOLUMBAR EVALUATION   Patient Name: Jimmy Howell MRN: [id number] DOB:1970-03-21, 52 y.o., male Today's Date: 02/27/2022  END OF SESSION:  PT End of Session - 02/26/22 1812     Visit Number 1    Number of Visits 17    Date for PT Re-Evaluation 04/23/22    Authorization Type 2x/wk x 8 weeks    PT Start Time 1715    PT Stop Time 1800    PT Time Calculation (min) 45 min    Behavior During Therapy WFL for tasks assessed/performed             Past Medical History:  Diagnosis Date   Asthma    Chickenpox    COPD (chronic obstructive pulmonary disease) (HCC)    GERD (gastroesophageal reflux disease)    Mycobacterial disease, 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 Patient like assistance and wants to use Wellbutrin. We'll start on Wellbutrin 150 mg daily for 3 days and then increase to 150 mg twice a day.   Past Surgical History:  Procedure Laterality Date   ESOPHAGOGASTRODUODENOSCOPY (EGD) WITH PROPOFOL N/A 08/10/2020   Procedure: ESOPHAGOGASTRODUODENOSCOPY (EGD) WITH PROPOFOL;  Surgeon: Jimmy Bellows, MD;  Location: [facility] ENDOSCOPY;  Service: Gastroenterology;  Laterality: N/A;   FINGER SURGERY     HERNIA REPAIR     Inguinal Hernia Repair   VIDEO ASSISTED THORACOSCOPY (VATS)/THOROCOTOMY Right 08/20/2015   Procedure: PREOP BRONCH, THORACOTOMY, RIGHT UPPER LOBECTOMY;  Surgeon: Jimmy Lewandowsky, MD;  Location: [facility] ORS;  Service: General;  Laterality: Right;   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   History of 2019 novel coronavirus disease (COVID-19) 08/20/2018   Hyperlipidemia 04/19/2018   Obesity (BMI  30.0-34.9) 06/17/2017   Fatty liver 06/17/2017   Decreased sex drive [id number]   Anxiety and depression 10/23/2015   Alcohol abuse 10/23/2015   Atherosclerosis of native coronary artery of native heart without angina pectoris 08/03/2015   Acid reflux 07/09/2015   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   Low back pain 03/22/2015   Lung nodules 02/19/2015   COPD (chronic obstructive pulmonary disease) ([place]) 02/19/2015   BPH (benign prostatic hyperplasia) 02/19/2015   Liver lesion 02/19/2015   Degeneration of intervertebral disc of cervical region 11/21/2013   Cervical spinal stenosis 11/21/2013   Back ache 08/31/2013    PCP: Jimmy Rumps, MD  REFERRING PROVIDER: Geronimo Boot, PA-C  REFERRING DIAG: Low back pain with L LE sciatica  Rationale for Evaluation and Treatment: Rehabilitation  THERAPY DIAG:  Acute bilateral low back pain with left-sided sciatica  Other low back pain  Radiculopathy, lumbar region  ONSET DATE: 3 weeks ago  SUBJECTIVE:  SUBJECTIVE STATEMENT: Pt has been having L lower back and L LE pain.  He noticed onset about 3 weeks ago.  No specific MOI.  He describes onset of LBP first and then leg sx developed a few days later.  He saw a spine specialist and had x-rays and has been taking muscle relaxers, meloxicam.  He feels neither are helping yet.  Standing and prolonged walking are aggravating.  Standing tolerance is ~10 minutes.  Sleeping on L side is aggravating  He tries to change positions for relief.  Has tried a salonpas- no significant relief noted.  Social: Married; his wife Jimmy Howell is here today.  He has 6 grandkids (ages 63-16) and spends time with them.  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  PERTINENT HISTORY:  Pt reports not having h/o LBP prior to this  episode  PAIN:  Are you having pain? Yes, low back/L LE to posterior knee 3-4/10  PRECAUTIONS: None  WEIGHT BEARING RESTRICTIONS: No  FALLS:  Has patient fallen in last 6 months? No  LIVING ENVIRONMENT: Lives with: lives with their spouse Lives in: House/apartment  PLOF: Independent  PATIENT GOALS: to get rid of pain and be able to stand and perform his normal daily activities without being limited by pain  NEXT MD VISIT: End of March  OBJECTIVE:   DIAGNOSTIC FINDINGS:  Lumbar x-rays  IMPRESSION: (per chart review 02/14/22 results:) 1. Multilevel degenerative changes, as described above. 2. Grade 1 retrolisthesis at the L5-S1 level measuring 3 mm in the neutral position, 1 mm with flexion and 3 mm with extension. 3. 5 mm of retrolisthesis at the L4-5 level with extension, 3 mm in the neutral position and none with flexion.  PATIENT SURVEYS:  FOTO 44/63  SCREENING FOR RED FLAGS: Bowel or bladder incontinence: No Cauda equina syndrome: No   COGNITION: Overall cognitive status: Within functional limits for tasks assessed     SENSATION: Light touch: Impaired  L 4 dermatome  POSTURE:  reduced lumbar lordosis  PALPATION: Pt reports (+) reproduction of pain with PAM: CPA L3-L5.  No reproduction of LE sx with PAM; (+) mm tightness along L>R lumbar paraspinal mm  LUMBAR ROM:   AROM eval  Flexion Hands reach top of feet  Extension Reproduced sx,  major restricted  Right lateral flexion   Left lateral flexion   Right rotation Reproduces sx  Left rotation Did not reproduce sx   (Blank rows = not tested)  LOWER EXTREMITY ROM:     Passive  Right eval Left eval  Hip flexion 105 105  Hip extension 0 0  Hip abduction    Hip adduction    Hip internal rotation 15 15  Hip external rotation 30 30   (Blank rows = not tested)  LOWER EXTREMITY MMT/Myotomes:    MMT Right eval Left eval  Hip flexion 5 5  Knee ext 5 5  Ankle DF 5 5  Gr toe Ext 5 5  Knee flex 5  5  Ankle PF 5 5   (Blank rows = not tested)  LUMBAR SPECIAL TESTS:  Straight leg raise test: Negative, Slump test: Negative, and Quadrant test: Positive with R rot and ext (L sx) Normal b/l patellar/achilles reflexes  FUNCTIONAL TESTS:  Pt transfers supine to sit with trunk flexion, breath holding, decreased abdominal strength observed  GAIT: Pt amb without any AD, decreased trunk rotation noted and reduced stride length noted  TODAY'S TREATMENT:  DATE: 02/26/22  Supine hooklying trunk rotation gentle ROM x10 ea SKTC x5 SKTC with strap x5 Seated lumbar flexion x5  PATIENT EDUCATION:  Education details: HEP, PT POC/goals, peripheralization vs centralization pain concepts, discussed purpose of TDN intervention Person educated: Patient and Spouse Education method: Explanation, Demonstration, and Handouts Education comprehension: verbalized understanding, returned demonstration, and needs further education  HOME EXERCISE PROGRAM: [place] 5 second holds x 10, 2x/day Seated flexion, 5 second holds x 10, 2x/day Instructed to monitor for sx centralize vs peripheralize Cold pack for pain throughout day as needed (Issued from [place])  ASSESSMENT:  CLINICAL IMPRESSION: Patient is a 52 y.o. M who was seen today for physical therapy evaluation and treatment for acute low back pain with L sciatica.     OBJECTIVE IMPAIRMENTS: decreased activity tolerance, decreased ROM, decreased strength, hypomobility, impaired sensation, improper body mechanics, and pain.   ACTIVITY LIMITATIONS: carrying, lifting, standing, and locomotion level  PARTICIPATION LIMITATIONS: meal prep, cleaning, laundry, community activity, occupation, and yard work  PERSONAL FACTORS: Age and Profession are also affecting patient's functional outcome.   REHAB POTENTIAL: Good  CLINICAL DECISION  MAKING: Stable/uncomplicated  EVALUATION COMPLEXITY: Low   GOALS: Goals reviewed with patient? Yes  SHORT TERM GOALS: Target date: 03/07/22  Pt will be able to demonstrate ability to perform LE/lumbar spine AROM exercises for HEP Baseline: initiated today Goal status: INITIAL  2.  Pt will be able to tolerate standing x >15 min without needing sitting break while performing electrical work Baseline: 10 Goal status: INITIAL    LONG TERM GOALS: Target date: 04/23/22   Improve FOTO to 63 indicating improved ability to perform his daily activities at PLOF Baseline: 44 Goal status: INITIAL  2.  Pt will be able to perform abdominal muscle bracing technique during functional squat/lift/push/pull activities to promote optimal lumbar spine/body mechanics during his daily activities Baseline: unable Goal status: INITIAL  3. Additional goals will be established if pt continues PT at this location    PLAN:  PT FREQUENCY: 1-2x/week  PT DURATION: 8 weeks  PLANNED INTERVENTIONS: Therapeutic exercises, Therapeutic activity, Neuromuscular re-education, Balance training, Gait training, Patient/Family education, Self Care, and Joint mobilization.  PLAN FOR NEXT SESSION: initiated HEP today; pt is going to check on his insurance before continuing bc concerned if this clinic is in or out of network with his insurance.  Plan to continue with manual therapy, including therapeutic dry needling technique, body mechanics retraining, LE and spine ROM/strengthening at next session if he continues PT at this location.    Merdis Delay, PT, DPT, OCS  [id number]   Pincus Badder, PT 02/27/2022, 5:47 PM

## 2022-03-03 ENCOUNTER — Other Ambulatory Visit: Payer: Self-pay | Admitting: Orthopedic Surgery

## 2022-03-03 ENCOUNTER — Ambulatory Visit: Payer: BC Managed Care – PPO

## 2022-03-03 ENCOUNTER — Other Ambulatory Visit: Payer: Self-pay

## 2022-03-03 DIAGNOSIS — M5442 Lumbago with sciatica, left side: Secondary | ICD-10-CM

## 2022-03-03 DIAGNOSIS — M5416 Radiculopathy, lumbar region: Secondary | ICD-10-CM

## 2022-03-04 ENCOUNTER — Other Ambulatory Visit: Payer: Self-pay

## 2022-03-04 MED FILL — Methocarbamol Tab 500 MG: ORAL | 10 days supply | Qty: 30 | Fill #0 | Status: AC

## 2022-03-10 ENCOUNTER — Other Ambulatory Visit: Payer: Self-pay

## 2022-03-24 ENCOUNTER — Other Ambulatory Visit: Payer: Self-pay | Admitting: Family Medicine

## 2022-03-24 ENCOUNTER — Other Ambulatory Visit: Payer: Self-pay

## 2022-03-24 DIAGNOSIS — N529 Male erectile dysfunction, unspecified: Secondary | ICD-10-CM

## 2022-03-25 ENCOUNTER — Other Ambulatory Visit: Payer: Self-pay

## 2022-03-25 ENCOUNTER — Other Ambulatory Visit: Payer: Self-pay | Admitting: Internal Medicine

## 2022-03-25 MED ORDER — ANORO ELLIPTA 62.5-25 MCG/ACT IN AEPB
1.0000 | INHALATION_SPRAY | Freq: Every day | RESPIRATORY_TRACT | 1 refills | Status: DC
  Filled 2022-03-25: qty 60, 30d supply, fill #0
  Filled 2022-04-27: qty 60, 30d supply, fill #1

## 2022-03-25 MED ORDER — SILDENAFIL CITRATE 100 MG PO TABS
50.0000 mg | ORAL_TABLET | Freq: Every day | ORAL | 0 refills | Status: DC | PRN
  Filled 2022-03-25 (×2): qty 10, 10d supply, fill #0

## 2022-03-25 NOTE — Progress Notes (Unsigned)
Referring Physician:  Leone Haven, MD [address] [place],  [place] [address]  Primary Physician:  Leone Haven, MD  History of Present Illness: [name] has a history of CAD, COPD, fatty liver, BPH, hyperlipidemia, alcohol abuse.   Last seen by me on 02/13/22 for left sided back and left leg pain. He has known retrolisthesis L4-L5 and L5-S1 with diffuse lumbar spondylosis and moderate/severe DDD L5-S1.   He was sent to PT- he did one visit at [facility] and then has been going to [facility]. He was also given mobic and robaxin. He is here for follow up.   He has not seen any improvement with PT. He continues with constant LBP with constant left lateral leg pain to his foot. No right leg pain. LBP < left leg pain. Pain is worse with walking and standing. No weakness in legs.   Not much relief with mobic or robaxin.   Bowel/Bladder Dysfunction: none  He does not smoke.   Conservative measures:  Physical therapy: he did 6 visits from 02/26/22 until now. [facility].  Multimodal medical therapy including regular antiinflammatories: motrin, tylenol, topical lidocaine, flexeril, prednisone Injections: No epidural steroid injections  Past Surgery: no spinal surgery  Jimmy Howell has no symptoms of cervical myelopathy.  The symptoms are causing a significant impact on the patient's life.   Review of Systems:  A 10 point review of systems is negative, except for the pertinent positives and negatives detailed in the HPI.  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 Patient like  assistance and wants to use Wellbutrin. We'll start on Wellbutrin 150 mg daily for 3 days and then increase to 150 mg twice a day.    Past Surgical History: Past Surgical History:  Procedure Laterality Date   ESOPHAGOGASTRODUODENOSCOPY (EGD) WITH PROPOFOL N/A 08/10/2020   Procedure: ESOPHAGOGASTRODUODENOSCOPY (EGD) WITH PROPOFOL;  Surgeon: Jonathon Bellows, MD;  Location: [facility] ENDOSCOPY;  Service: Gastroenterology;  Laterality: N/A;   FINGER SURGERY     HERNIA REPAIR     Inguinal Hernia Repair   VIDEO ASSISTED THORACOSCOPY (VATS)/THOROCOTOMY Right 08/20/2015   Procedure: PREOP BRONCH, THORACOTOMY, RIGHT UPPER LOBECTOMY;  Surgeon: Nestor Lewandowsky, MD;  Location: [facility] ORS;  Service: General;  Laterality: Right;    Allergies: Allergies as of 03/27/2022   (No Known Allergies)    Medications: Outpatient Encounter Medications as of 03/27/2022  Medication Sig   albuterol (VENTOLIN HFA) 108 (90 Base) MCG/ACT inhaler INHALE 2 PUFFS INTO THE LUNGS EVERY 4 HOURS AS NEEDED FOR WHEEZING OR SHORTNESS OF BREATH.   meloxicam (MOBIC) 15 MG tablet Take 1 tablet (15 mg total) by mouth daily. Take with food.   methocarbamol (ROBAXIN) 500 MG tablet Take 1 tablet (500 mg total) by mouth every 8 (eight) hours as needed for muscle spasms.   olopatadine (PATADAY) 0.1 % ophthalmic solution Place 1 drop into both eyes 2 (two) times daily.   omeprazole (PRILOSEC) 20 MG capsule Take 1 capsule (20 mg total) by mouth daily.   rosuvastatin (CRESTOR) 20 MG tablet  TAKE 1 TABLET BY MOUTH ONCE DAILY   sildenafil (VIAGRA) 100 MG tablet Take 0.5-1 tablets (50-100 mg total) by mouth daily as needed for erectile dysfucntion.   umeclidinium-vilanterol (ANORO ELLIPTA) 62.5-25 MCG/ACT AEPB Inhale 1 puff into the lungs daily.   Facility-Administered Encounter Medications as of 03/27/2022  Medication   ipratropium-albuterol (DUONEB) 0.5-2.5 (3) MG/3ML nebulizer solution 3 mL    Social History: Social History   Tobacco Use   Smoking  status: Former    Packs/day: 2.00    Years: 30.00    Additional pack years: 0.00    Total pack years: 60.00    Types: Cigarettes    Quit date: 07/07/2015    Years since quitting: 6.7   Smokeless tobacco: Never  Vaping Use   Vaping Use: Never used  Substance Use Topics   Alcohol use: Yes    Alcohol/week: 0.0 standard drinks of alcohol    Comment: 1 Case of Beer / Weekly   Drug use: No    Family Medical History: Family History  Problem Relation Age of Onset   Bladder Cancer Father    Cancer Paternal Aunt        stomach   Arthritis Other        Parent   Hyperlipidemia Other        Parent   Heart disease Other        Parent   Hypertension Other        Parent   Kidney disease Other        Parent    Physical Examination: There were no vitals filed for this visit.    Awake, alert, oriented to person, place, and time.  Speech is clear and fluent. Fund of knowledge is appropriate.   Cranial Nerves: Pupils equal round and reactive to light.  Facial tone is symmetric.    No abnormal lesions on exposed skin.   Strength:  Side Iliopsoas Quads Hamstring PF DF EHL  R 5 5 5 5 5 5   L 5 5 5 5 5 5    Reflexes are 2+ and symmetric at the patella and achilles.    Clonus is not present.   Bilateral lower extremity sensation is intact to light touch.     Normal gait.  Medical Decision Making  Imaging: none  Assessment and Plan: Jimmy Howell is a pleasant 52 y.o. male continues with constant LBP with constant left lateral leg pain to his foot. No right leg pain. LBP < left leg pain. Pain is worse with walking and standing. No weakness in legs.   No improvement with lumbar PT.   He has known retrolisthesis L4-L5 and L5-S1 with diffuse lumbar spondylosis and moderate/severe DDD L5-S1.  Treatment options discussed with patient and following plan made:   - MRI of lumbar spine to further evaluate lumbar radiculopathy. No improvement with PT, medications, or time.  - Continue prn  mobic. Reviewed dosing and side effects. Take with food.  - Continue prn robaxin to take prn muscle spasms. Reviewed dosing and side effects. Discussed this can cause drowsiness.  - Depending on MRI results, will consider lumbar injections (he would want to go to [facility] in [place]).   I spent a total of 15 minutes in face-to-face and non-face-to-face activities related to this patient's care today including review of outside records, review of imaging, review of symptoms, physical exam, discussion of differential diagnosis, discussion of treatment options, and documentation.   Geronimo Boot PA-C Dept. of Neurosurgery

## 2022-03-27 ENCOUNTER — Ambulatory Visit: Payer: BC Managed Care – PPO | Admitting: Orthopedic Surgery

## 2022-03-27 ENCOUNTER — Encounter: Payer: Self-pay | Admitting: Orthopedic Surgery

## 2022-03-27 VITALS — BP 138/80 | Ht 69.0 in | Wt 216.0 lb

## 2022-03-27 DIAGNOSIS — M5136 Other intervertebral disc degeneration, lumbar region: Secondary | ICD-10-CM | POA: Diagnosis not present

## 2022-03-27 DIAGNOSIS — M4726 Other spondylosis with radiculopathy, lumbar region: Secondary | ICD-10-CM

## 2022-03-27 DIAGNOSIS — M47816 Spondylosis without myelopathy or radiculopathy, lumbar region: Secondary | ICD-10-CM

## 2022-03-27 DIAGNOSIS — M5416 Radiculopathy, lumbar region: Secondary | ICD-10-CM

## 2022-03-27 DIAGNOSIS — M4310 Spondylolisthesis, site unspecified: Secondary | ICD-10-CM

## 2022-03-27 NOTE — Patient Instructions (Signed)
It was so nice to see you today. Thank you so much for coming in.    I am sorry that you are not feeling better.   I want to get an MRI of your lower back to look into things further. We will get this approved through your insurance and [facility] will call you to schedule the appointment.   Once I have the MRI results, we will call you to schedule a phone visit with me to review them. Depending on the results, we may consider injections.   Please do not hesitate to call if you have any questions or concerns. You can also message me in [place].   Geronimo Boot PA-C [phone]

## 2022-04-08 ENCOUNTER — Other Ambulatory Visit: Payer: Self-pay | Admitting: Orthopedic Surgery

## 2022-04-08 DIAGNOSIS — M5416 Radiculopathy, lumbar region: Secondary | ICD-10-CM

## 2022-04-08 DIAGNOSIS — M5442 Lumbago with sciatica, left side: Secondary | ICD-10-CM

## 2022-04-08 DIAGNOSIS — M47816 Spondylosis without myelopathy or radiculopathy, lumbar region: Secondary | ICD-10-CM

## 2022-04-08 DIAGNOSIS — M4310 Spondylolisthesis, site unspecified: Secondary | ICD-10-CM

## 2022-04-08 DIAGNOSIS — M5136 Other intervertebral disc degeneration, lumbar region: Secondary | ICD-10-CM

## 2022-04-09 ENCOUNTER — Other Ambulatory Visit: Payer: Self-pay

## 2022-04-09 MED ORDER — METHOCARBAMOL 500 MG PO TABS
500.0000 mg | ORAL_TABLET | Freq: Three times a day (TID) | ORAL | 0 refills | Status: DC | PRN
  Filled 2022-04-09: qty 30, 10d supply, fill #0

## 2022-04-16 ENCOUNTER — Ambulatory Visit
Admission: RE | Admit: 2022-04-16 | Discharge: 2022-04-16 | Disposition: A | Discharge status: Home / Self Care | Payer: BC Managed Care – PPO | Source: Ambulatory Visit | Attending: Orthopedic Surgery | Admitting: Orthopedic Surgery

## 2022-04-16 ENCOUNTER — Encounter: Payer: Self-pay | Admitting: *Deleted

## 2022-04-16 ENCOUNTER — Other Ambulatory Visit: Payer: Self-pay

## 2022-04-16 ENCOUNTER — Other Ambulatory Visit: Payer: Self-pay | Admitting: Family Medicine

## 2022-04-16 DIAGNOSIS — M4310 Spondylolisthesis, site unspecified: Secondary | ICD-10-CM

## 2022-04-16 DIAGNOSIS — M5136 Other intervertebral disc degeneration, lumbar region: Secondary | ICD-10-CM

## 2022-04-16 DIAGNOSIS — E785 Hyperlipidemia, unspecified: Secondary | ICD-10-CM

## 2022-04-16 DIAGNOSIS — M5416 Radiculopathy, lumbar region: Secondary | ICD-10-CM

## 2022-04-16 DIAGNOSIS — M47816 Spondylosis without myelopathy or radiculopathy, lumbar region: Secondary | ICD-10-CM

## 2022-04-16 MED ORDER — ROSUVASTATIN CALCIUM 20 MG PO TABS
ORAL_TABLET | Freq: Every day | ORAL | 0 refills | Status: DC
  Filled 2022-04-16: qty 90, 90d supply, fill #0

## 2022-04-17 ENCOUNTER — Other Ambulatory Visit: Payer: Self-pay

## 2022-04-17 ENCOUNTER — Telehealth: Payer: Self-pay | Admitting: *Deleted

## 2022-04-17 MED ORDER — DIAZEPAM 5 MG PO TABS
ORAL_TABLET | ORAL | 0 refills | Status: DC
Start: 2022-04-17 — End: 2022-10-24
  Filled 2022-04-17: qty 2, 1d supply, fill #0

## 2022-04-17 NOTE — Telephone Encounter (Signed)
Left message for pt to call back to schedule yearly lung cancer screening CT.

## 2022-04-18 ENCOUNTER — Other Ambulatory Visit: Payer: Self-pay

## 2022-04-18 DIAGNOSIS — Z87891 Personal history of nicotine dependence: Secondary | ICD-10-CM

## 2022-04-21 ENCOUNTER — Ambulatory Visit
Admission: RE | Admit: 2022-04-21 | Discharge: 2022-04-21 | Disposition: A | Discharge status: Home / Self Care | Payer: BC Managed Care – PPO | Source: Ambulatory Visit | Attending: Orthopedic Surgery | Admitting: Orthopedic Surgery

## 2022-04-21 DIAGNOSIS — M5136 Other intervertebral disc degeneration, lumbar region: Secondary | ICD-10-CM | POA: Diagnosis present

## 2022-04-21 DIAGNOSIS — M4310 Spondylolisthesis, site unspecified: Secondary | ICD-10-CM | POA: Insufficient documentation

## 2022-04-21 DIAGNOSIS — M5416 Radiculopathy, lumbar region: Secondary | ICD-10-CM | POA: Diagnosis present

## 2022-04-21 DIAGNOSIS — M47816 Spondylosis without myelopathy or radiculopathy, lumbar region: Secondary | ICD-10-CM | POA: Insufficient documentation

## 2022-04-23 ENCOUNTER — Other Ambulatory Visit: Payer: Self-pay | Admitting: Family Medicine

## 2022-04-23 DIAGNOSIS — N529 Male erectile dysfunction, unspecified: Secondary | ICD-10-CM

## 2022-04-25 ENCOUNTER — Other Ambulatory Visit: Payer: Self-pay | Admitting: Family Medicine

## 2022-04-25 ENCOUNTER — Other Ambulatory Visit: Payer: Self-pay

## 2022-04-25 DIAGNOSIS — N529 Male erectile dysfunction, unspecified: Secondary | ICD-10-CM

## 2022-04-25 NOTE — Progress Notes (Unsigned)
Telephone Visit- Progress Note: Referring Physician:  Glori Luis, MD [address] [place],  [place] [address]  Primary Physician:  Glori Luis, MD  This visit was performed via telephone.  Patient location: home Provider location: office  I spent a total of 10 minutes non-face-to-face activities for this visit on the date of this encounter including review of current clinical condition and response to treatment.    Patient has given verbal consent to this telephone visits and we reviewed the limitations of a telephone visit. Patient wishes to proceed.    Chief Complaint:  review lumbar MRI  History of Present Illness: Jimmy Howell is a 52 y.o. male has a history of CAD, COPD, fatty liver, BPH, hyperlipidemia, alcohol abuse.    Last seen by me on 03/27/22 for left sided back and left leg pain. He has known retrolisthesis L4-L5 and L5-S1 with diffuse lumbar spondylosis and moderate/severe DDD L5-S1.   MRI was ordered and phone visit scheduled to review it.    He continues with constant LBP with constant left lateral leg pain to his foot. No right leg pain. LBP < left leg pain. Pain is worse with walking and standing. No weakness in legs.   He is on mobic and robaxin. No refills needed.    Conservative measures:  Physical therapy: he did 6 visits from 02/26/22 until now. [facility] and [facility].  Multimodal medical therapy including regular antiinflammatories: motrin, tylenol, topical lidocaine, flexeril, prednisone Injections: No epidural steroid injections   Past Surgery: no spinal surgery.   Exam: No exam done as this was a telephone encounter.     Imaging: Lumbar MRI dated 04/21/22:  FINDINGS: Segmentation:  Standard.   Alignment:  Trace degenerative retrolisthesis at L5-S1.   Vertebrae: There is no evidence of acute fracture, discitis, or aggressive osseous lesion.   Conus medullaris and cauda equina: Conus extends to the L1  level. Conus and cauda equina appear normal.   Paraspinal and other soft tissues: Negative.   Disc levels:   T11-T12: No significant spinal canal or neural foraminal narrowing.   T12-L1: No significant spinal canal or neural foraminal narrowing.   L1-L2: No significant spinal canal or neural foraminal narrowing.   L2-L3: No significant spinal canal or neural foraminal narrowing.   L3-L4: Mild endplate spurring. No significant spinal canal or neural foraminal stenosis.   L4-L5: Mild endplate spurring encroaches the left exiting L4 nerve root (sagittal T2 image 12). No impingement.   L5-S1: Trace degenerative retrolisthesis. Asymmetric left disc height loss, disc bulging, and endplate spurring with mild bilateral lateral facet arthropathy. There is severe left-sided neural foraminal stenosis and probable exiting L5 nerve root impingement. There is encroachment of the descending left S1 nerve root. No significant right neural foraminal narrowing.   IMPRESSION: Disc bulging, endplate spurring, and facet arthropathy at L5-S1 results in severe left-sided neural foraminal stenosis and probable exiting L5 nerve root impingement. There is also encroachment of the descending left S1 nerve root.   Mild endplate spurring at L4-L5 encroaches the exiting left L4 nerve root, without definite impingement.     Electronically Signed   By: Caprice Renshaw M.D.   On: 04/24/2022 09:09  I have personally reviewed the images and agree with the above interpretation.  Assessment and Plan: [name] is a pleasant 52 y.o. male with constant LBP with constant left lateral leg pain to his foot. No right leg pain. LBP < left leg pain. Pain is worse  with walking and standing. No weakness in legs.   Known retrolisthesis L5-S1 with left sided disc and bilateral facet hypertrophy with severe left foraminal stenosis and probable impingement of left L5 and S1 nerve root.   Treatment options discussed with  patient and following plan made:   - No relief with previous PT. Will hold off on revisiting. - Continue on mobic. Reviewed dosing and side effects. Take with food.  - Continue on robaxin to take prn muscle spasms. Reviewed dosing and side effects. Discussed this can cause drowsiness.  - He would like to proceed with injections. He will see who is in network with his insurance and let me know.  - Follow up with me in 6-8 weeks and prn.   Drake Leach PA-C Neurosurgery

## 2022-04-26 ENCOUNTER — Other Ambulatory Visit: Payer: Self-pay | Admitting: Internal Medicine

## 2022-04-27 NOTE — Telephone Encounter (Signed)
  Last ordered: 1 month ago (03/25/2022) by Glori Luis, MD    LOV: 01/27/22

## 2022-04-28 ENCOUNTER — Ambulatory Visit: Payer: BC Managed Care – PPO | Admitting: Family Medicine

## 2022-04-28 ENCOUNTER — Other Ambulatory Visit: Payer: Self-pay

## 2022-04-28 MED ORDER — ALBUTEROL SULFATE HFA 108 (90 BASE) MCG/ACT IN AERS
2.0000 | INHALATION_SPRAY | RESPIRATORY_TRACT | 11 refills | Status: AC | PRN
  Filled 2022-04-28: qty 6.7, 16d supply, fill #0
  Filled 2022-11-23: qty 6.7, 16d supply, fill #1

## 2022-04-28 MED FILL — Sildenafil Citrate Tab 100 MG: ORAL | 10 days supply | Qty: 10 | Fill #0 | Status: AC

## 2022-04-29 ENCOUNTER — Ambulatory Visit (INDEPENDENT_AMBULATORY_CARE_PROVIDER_SITE_OTHER): Payer: BC Managed Care – PPO | Admitting: Orthopedic Surgery

## 2022-04-29 ENCOUNTER — Encounter: Payer: Self-pay | Admitting: Orthopedic Surgery

## 2022-04-29 ENCOUNTER — Other Ambulatory Visit: Payer: Self-pay

## 2022-04-29 DIAGNOSIS — M5416 Radiculopathy, lumbar region: Secondary | ICD-10-CM

## 2022-04-29 DIAGNOSIS — M5126 Other intervertebral disc displacement, lumbar region: Secondary | ICD-10-CM

## 2022-04-29 DIAGNOSIS — M5136 Other intervertebral disc degeneration, lumbar region: Secondary | ICD-10-CM

## 2022-04-29 DIAGNOSIS — M4310 Spondylolisthesis, site unspecified: Secondary | ICD-10-CM | POA: Diagnosis not present

## 2022-04-29 DIAGNOSIS — M51369 Other intervertebral disc degeneration, lumbar region without mention of lumbar back pain or lower extremity pain: Secondary | ICD-10-CM

## 2022-04-29 DIAGNOSIS — M4726 Other spondylosis with radiculopathy, lumbar region: Secondary | ICD-10-CM | POA: Diagnosis not present

## 2022-04-29 DIAGNOSIS — M47816 Spondylosis without myelopathy or radiculopathy, lumbar region: Secondary | ICD-10-CM

## 2022-05-02 ENCOUNTER — Ambulatory Visit
Admission: RE | Admit: 2022-05-02 | Discharge: 2022-05-02 | Disposition: A | Discharge status: Home / Self Care | Payer: BC Managed Care – PPO | Source: Ambulatory Visit | Attending: Family Medicine | Admitting: Family Medicine

## 2022-05-02 DIAGNOSIS — Z87891 Personal history of nicotine dependence: Secondary | ICD-10-CM | POA: Insufficient documentation

## 2022-05-07 ENCOUNTER — Telehealth: Payer: Self-pay | Admitting: Acute Care

## 2022-05-07 DIAGNOSIS — Z87891 Personal history of nicotine dependence: Secondary | ICD-10-CM

## 2022-05-07 DIAGNOSIS — R911 Solitary pulmonary nodule: Secondary | ICD-10-CM

## 2022-05-07 NOTE — Telephone Encounter (Signed)
Results/plans faxed to PCP. Order placed for follow up Chest CT.

## 2022-05-07 NOTE — Telephone Encounter (Signed)
I have called the patient with the results of his low-dose screening CT.  I explained that his scan was read as a lung RADS 2 however on closer review of the results there was 1 nodule in the left lower lobe that had grown from 8.7 mm to 9 mm within a 18-month period of time.  Upon closer review it appears that there may be some motion artifact on the scan. As there appears to be some growth we will we checked the scan in 6 months to determine if the motion artifact is what caused the change in size of the nodule. The patient is in agreement with this plan and understands we will call him to get his follow-up low-dose screening CT scheduled closer to the 61-month window. This scan will be due in November 2024. Denise please fax results to PCP and let them know plan is for 8-month follow-up to reevaluate a nodule that has grown 3 mm in the last 12 months. Thanks so much.

## 2022-05-27 ENCOUNTER — Other Ambulatory Visit: Payer: Self-pay | Admitting: Internal Medicine

## 2022-05-27 ENCOUNTER — Other Ambulatory Visit: Payer: Self-pay | Admitting: Family Medicine

## 2022-05-27 ENCOUNTER — Other Ambulatory Visit: Payer: Self-pay

## 2022-05-27 DIAGNOSIS — N529 Male erectile dysfunction, unspecified: Secondary | ICD-10-CM

## 2022-05-27 MED ORDER — ANORO ELLIPTA 62.5-25 MCG/ACT IN AEPB
1.0000 | INHALATION_SPRAY | Freq: Every day | RESPIRATORY_TRACT | 1 refills | Status: DC
  Filled 2022-05-27: qty 60, 30d supply, fill #0
  Filled 2022-06-27: qty 60, 30d supply, fill #1

## 2022-05-27 MED ORDER — SILDENAFIL CITRATE 100 MG PO TABS
50.0000 mg | ORAL_TABLET | Freq: Every day | ORAL | 0 refills | Status: DC | PRN
Start: 2022-05-27 — End: 2022-06-27
  Filled 2022-05-27: qty 10, 10d supply, fill #0

## 2022-05-29 ENCOUNTER — Encounter: Payer: Self-pay | Admitting: *Deleted

## 2022-06-06 ENCOUNTER — Encounter: Payer: Self-pay | Admitting: Family Medicine

## 2022-06-06 ENCOUNTER — Ambulatory Visit: Payer: BC Managed Care – PPO | Admitting: Family Medicine

## 2022-06-06 VITALS — BP 124/80 | HR 76 | Temp 98.3°F | Ht 69.0 in | Wt 218.6 lb

## 2022-06-06 DIAGNOSIS — M545 Low back pain, unspecified: Secondary | ICD-10-CM

## 2022-06-06 DIAGNOSIS — E669 Obesity, unspecified: Secondary | ICD-10-CM

## 2022-06-06 DIAGNOSIS — R918 Other nonspecific abnormal finding of lung field: Secondary | ICD-10-CM | POA: Diagnosis not present

## 2022-06-06 DIAGNOSIS — Z6830 Body mass index (BMI) 30.0-30.9, adult: Secondary | ICD-10-CM

## 2022-06-06 NOTE — Assessment & Plan Note (Signed)
Patient will continue to follow with physical medicine and rehab.

## 2022-06-06 NOTE — Assessment & Plan Note (Signed)
Patient will continue to follow with pulmonology.

## 2022-06-06 NOTE — Progress Notes (Signed)
Marikay Alar, MD Phone: [phone]  Jimmy Howell is a 52 y.o. male who presents today for f/u.  Obesity: Patient has he is dropped about 10 pounds on his home scale.  He is not eating out as much.  He has reduced his soda intake from 4-5 regular sodas per day to 2 diet sodas per day.  He has not been able to exercise much given his low back pain issues.  Lumbar radiculopathy: Patient reports he had an injection in his back and that has helped quite a bit.  He sees them for follow-up next week.  Lung nodule: Patient reports he had a scan about a month ago.  They wanted to bring him back in 6 months to recheck.  Social History   Tobacco Use  Smoking Status Former   Packs/day: 2.00   Years: 30.00   Additional pack years: 0.00   Total pack years: 60.00   Types: Cigarettes   Quit date: 07/07/2015   Years since quitting: 6.9  Smokeless Tobacco Never    Current Outpatient Medications on File Prior to Visit  Medication Sig Dispense Refill   albuterol (VENTOLIN HFA) 108 (90 Base) MCG/ACT inhaler Inhale 2 puffs into the lungs every 4 (four) hours as needed for wheezing or shortness of breath. 6.7 g 11   diazepam (VALIUM) 5 MG tablet Take 1 tablet by mouth 30 minutes prior to MRI scan. May repeat once right before MRI scan if needed. You will need a driver. 2 tablet 0   meloxicam (MOBIC) 15 MG tablet Take 1 tablet (15 mg total) by mouth daily. Take with food. 30 tablet 2   methocarbamol (ROBAXIN) 500 MG tablet Take 1 tablet (500 mg total) by mouth every 8 (eight) hours as needed for muscle spasms. 30 tablet 0   olopatadine (PATADAY) 0.1 % ophthalmic solution Place 1 drop into both eyes 2 (two) times daily. 5 mL 12   omeprazole (PRILOSEC) 20 MG capsule Take 1 capsule (20 mg total) by mouth daily. 30 capsule 11   rosuvastatin (CRESTOR) 20 MG tablet TAKE 1 TABLET BY MOUTH ONCE DAILY 90 tablet 0   sildenafil (VIAGRA) 100 MG tablet Take 0.5-1 tablets (50-100 mg total) by mouth daily as  needed for erectile dysfucntion. 10 tablet 0   umeclidinium-vilanterol (ANORO ELLIPTA) 62.5-25 MCG/ACT AEPB Inhale 1 puff into the lungs daily. 60 each 1   Current Facility-Administered Medications on File Prior to Visit  Medication Dose Route Frequency Provider Last Rate Last Admin   ipratropium-albuterol (DUONEB) 0.5-2.5 (3) MG/3ML nebulizer solution 3 mL  3 mL Nebulization Q6H Allegra Grana, FNP   3 mL at 01/01/18 1024     ROS see history of present illness  Objective  Physical Exam Vitals:   06/06/22 1449  BP: 124/80  Pulse: 76  Temp: 98.3 F (36.8 C)  SpO2: 96%    BP Readings from Last 3 Encounters:  06/06/22 124/80  03/27/22 138/80  02/19/22 (!) 140/88   Wt Readings from Last 3 Encounters:  06/06/22 218 lb 9.6 oz (99.2 kg)  03/27/22 216 lb (98 kg)  02/19/22 216 lb 9.6 oz (98.2 kg)    Physical Exam Constitutional:      General: He is not in acute distress.    Appearance: He is not diaphoretic.  Cardiovascular:     Rate and Rhythm: Normal rate and regular rhythm.     Heart sounds: Normal heart sounds.  Pulmonary:     Effort: Pulmonary effort is normal.  Breath sounds: Normal breath sounds.  Skin:    General: Skin is warm and dry.  Neurological:     Mental Status: He is alert.      Assessment/Plan: Please see individual problem list.  Obesity (BMI 30.0-34.9) Assessment & Plan: Chronic issue.  Patient's weight has trended down at home.  Encouraged continued dietary changes.  Discussed adding in exercise as he is able to with walking to limit stress on his back.   Acute bilateral low back pain without sciatica Assessment & Plan: Patient will continue to follow with physical medicine and rehab.   Lung nodules Assessment & Plan: Patient will continue to follow with pulmonology.     Return in about 3 months (around 09/06/2022) for weight follow-up.   Marikay Alar, MD [facility]

## 2022-06-06 NOTE — Assessment & Plan Note (Signed)
Chronic issue.  Patient's weight has trended down at home.  Encouraged continued dietary changes.  Discussed adding in exercise as he is able to with walking to limit stress on his back.

## 2022-06-17 ENCOUNTER — Ambulatory Visit: Payer: BC Managed Care – PPO | Admitting: Orthopedic Surgery

## 2022-06-27 ENCOUNTER — Other Ambulatory Visit: Payer: Self-pay | Admitting: Family Medicine

## 2022-06-27 DIAGNOSIS — N529 Male erectile dysfunction, unspecified: Secondary | ICD-10-CM

## 2022-06-27 DIAGNOSIS — E785 Hyperlipidemia, unspecified: Secondary | ICD-10-CM

## 2022-06-30 ENCOUNTER — Other Ambulatory Visit: Payer: Self-pay

## 2022-06-30 MED ORDER — SILDENAFIL CITRATE 100 MG PO TABS
50.0000 mg | ORAL_TABLET | Freq: Every day | ORAL | 0 refills | Status: DC | PRN
Start: 2022-06-30 — End: 2022-07-28
  Filled 2022-06-30: qty 10, 10d supply, fill #0

## 2022-06-30 MED ORDER — ROSUVASTATIN CALCIUM 20 MG PO TABS
20.0000 mg | ORAL_TABLET | Freq: Every day | ORAL | 0 refills | Status: DC
Start: 2022-06-30 — End: 2022-09-22
  Filled 2022-06-30: qty 90, 90d supply, fill #0

## 2022-07-24 ENCOUNTER — Other Ambulatory Visit: Payer: Self-pay | Admitting: Family Medicine

## 2022-07-24 ENCOUNTER — Other Ambulatory Visit: Payer: Self-pay | Admitting: Internal Medicine

## 2022-07-24 ENCOUNTER — Other Ambulatory Visit: Payer: Self-pay

## 2022-07-24 DIAGNOSIS — N529 Male erectile dysfunction, unspecified: Secondary | ICD-10-CM

## 2022-07-25 ENCOUNTER — Other Ambulatory Visit: Payer: Self-pay | Admitting: Family Medicine

## 2022-07-25 ENCOUNTER — Other Ambulatory Visit: Payer: Self-pay

## 2022-07-25 DIAGNOSIS — N529 Male erectile dysfunction, unspecified: Secondary | ICD-10-CM

## 2022-07-25 MED ORDER — ANORO ELLIPTA 62.5-25 MCG/ACT IN AEPB
1.0000 | INHALATION_SPRAY | Freq: Every day | RESPIRATORY_TRACT | 1 refills | Status: DC
  Filled 2022-07-25: qty 60, 30d supply, fill #0
  Filled 2022-08-24: qty 60, 30d supply, fill #1

## 2022-07-28 ENCOUNTER — Other Ambulatory Visit: Payer: Self-pay

## 2022-07-28 MED FILL — Sildenafil Citrate Tab 100 MG: ORAL | 10 days supply | Qty: 10 | Fill #0 | Status: AC

## 2022-08-21 ENCOUNTER — Encounter (INDEPENDENT_AMBULATORY_CARE_PROVIDER_SITE_OTHER): Payer: Self-pay

## 2022-08-21 ENCOUNTER — Encounter: Payer: Self-pay | Admitting: Family Medicine

## 2022-08-24 ENCOUNTER — Other Ambulatory Visit: Payer: Self-pay | Admitting: Family Medicine

## 2022-08-24 DIAGNOSIS — N529 Male erectile dysfunction, unspecified: Secondary | ICD-10-CM

## 2022-08-25 ENCOUNTER — Other Ambulatory Visit: Payer: Self-pay

## 2022-08-27 ENCOUNTER — Other Ambulatory Visit: Payer: Self-pay

## 2022-08-27 MED ORDER — SILDENAFIL CITRATE 100 MG PO TABS
50.0000 mg | ORAL_TABLET | Freq: Every day | ORAL | 0 refills | Status: DC | PRN
Start: 2022-08-27 — End: 2022-09-22
  Filled 2022-08-27: qty 10, 10d supply, fill #0

## 2022-09-10 ENCOUNTER — Ambulatory Visit: Payer: BC Managed Care – PPO | Admitting: Family Medicine

## 2022-09-22 ENCOUNTER — Other Ambulatory Visit: Payer: Self-pay | Admitting: Internal Medicine

## 2022-09-22 ENCOUNTER — Other Ambulatory Visit: Payer: Self-pay

## 2022-09-22 ENCOUNTER — Other Ambulatory Visit: Payer: Self-pay | Admitting: Family Medicine

## 2022-09-22 DIAGNOSIS — N529 Male erectile dysfunction, unspecified: Secondary | ICD-10-CM

## 2022-09-22 DIAGNOSIS — E785 Hyperlipidemia, unspecified: Secondary | ICD-10-CM

## 2022-09-22 MED ORDER — ANORO ELLIPTA 62.5-25 MCG/ACT IN AEPB
1.0000 | INHALATION_SPRAY | Freq: Every day | RESPIRATORY_TRACT | 1 refills | Status: DC
  Filled 2022-09-22: qty 60, 30d supply, fill #0
  Filled 2022-10-24: qty 60, 30d supply, fill #1

## 2022-09-23 ENCOUNTER — Other Ambulatory Visit: Payer: Self-pay

## 2022-09-23 MED ORDER — ROSUVASTATIN CALCIUM 20 MG PO TABS
20.0000 mg | ORAL_TABLET | Freq: Every day | ORAL | 0 refills | Status: DC
Start: 2022-09-23 — End: 2023-01-12
  Filled 2022-09-23: qty 90, 90d supply, fill #0
  Filled 2022-10-03: qty 30, 30d supply, fill #0
  Filled 2022-10-24 – 2022-11-03 (×2): qty 30, 30d supply, fill #1
  Filled 2022-12-10: qty 30, 30d supply, fill #2

## 2022-09-23 MED ORDER — SILDENAFIL CITRATE 100 MG PO TABS
50.0000 mg | ORAL_TABLET | Freq: Every day | ORAL | 0 refills | Status: DC | PRN
Start: 2022-09-23 — End: 2022-10-24
  Filled 2022-09-23: qty 6, 30d supply, fill #0
  Filled 2022-10-06: qty 10, 10d supply, fill #0

## 2022-10-01 ENCOUNTER — Other Ambulatory Visit: Payer: Self-pay

## 2022-10-03 ENCOUNTER — Other Ambulatory Visit: Payer: Self-pay

## 2022-10-03 ENCOUNTER — Other Ambulatory Visit: Payer: Self-pay | Admitting: Orthopedic Surgery

## 2022-10-03 ENCOUNTER — Other Ambulatory Visit: Payer: Self-pay | Admitting: Neurosurgery

## 2022-10-03 ENCOUNTER — Encounter: Payer: Self-pay | Admitting: Family Medicine

## 2022-10-03 DIAGNOSIS — M5416 Radiculopathy, lumbar region: Secondary | ICD-10-CM

## 2022-10-03 DIAGNOSIS — M5442 Lumbago with sciatica, left side: Secondary | ICD-10-CM

## 2022-10-03 NOTE — Telephone Encounter (Signed)
My chart message sent to patient to schedule appointment.

## 2022-10-06 ENCOUNTER — Other Ambulatory Visit: Payer: Self-pay

## 2022-10-24 ENCOUNTER — Other Ambulatory Visit: Payer: Self-pay

## 2022-10-24 ENCOUNTER — Other Ambulatory Visit: Payer: Self-pay | Admitting: Family Medicine

## 2022-10-24 ENCOUNTER — Ambulatory Visit: Payer: BC Managed Care – PPO | Admitting: Physician Assistant

## 2022-10-24 ENCOUNTER — Encounter: Payer: Self-pay | Admitting: Physician Assistant

## 2022-10-24 VITALS — BP 132/76 | HR 81 | Ht 69.0 in | Wt 215.0 lb

## 2022-10-24 DIAGNOSIS — I10 Essential (primary) hypertension: Secondary | ICD-10-CM | POA: Diagnosis not present

## 2022-10-24 DIAGNOSIS — D171 Benign lipomatous neoplasm of skin and subcutaneous tissue of trunk: Secondary | ICD-10-CM | POA: Insufficient documentation

## 2022-10-24 DIAGNOSIS — F1010 Alcohol abuse, uncomplicated: Secondary | ICD-10-CM

## 2022-10-24 DIAGNOSIS — M6208 Separation of muscle (nontraumatic), other site: Secondary | ICD-10-CM | POA: Insufficient documentation

## 2022-10-24 DIAGNOSIS — N529 Male erectile dysfunction, unspecified: Secondary | ICD-10-CM

## 2022-10-24 DIAGNOSIS — L819 Disorder of pigmentation, unspecified: Secondary | ICD-10-CM

## 2022-10-24 DIAGNOSIS — E66811 Obesity, class 1: Secondary | ICD-10-CM

## 2022-10-24 DIAGNOSIS — R7303 Prediabetes: Secondary | ICD-10-CM | POA: Insufficient documentation

## 2022-10-24 DIAGNOSIS — M545 Low back pain, unspecified: Secondary | ICD-10-CM

## 2022-10-24 DIAGNOSIS — G8929 Other chronic pain: Secondary | ICD-10-CM

## 2022-10-24 DIAGNOSIS — K429 Umbilical hernia without obstruction or gangrene: Secondary | ICD-10-CM | POA: Insufficient documentation

## 2022-10-24 DIAGNOSIS — N6322 Unspecified lump in the left breast, upper inner quadrant: Secondary | ICD-10-CM

## 2022-10-24 DIAGNOSIS — L723 Sebaceous cyst: Secondary | ICD-10-CM

## 2022-10-24 MED ORDER — METHOCARBAMOL 500 MG PO TABS
500.0000 mg | ORAL_TABLET | Freq: Three times a day (TID) | ORAL | 3 refills | Status: AC | PRN
Start: 2022-10-24 — End: ?
  Filled 2022-10-24: qty 30, 10d supply, fill #0
  Filled 2023-10-19: qty 30, 10d supply, fill #1

## 2022-10-24 MED ORDER — MELOXICAM 15 MG PO TABS
15.0000 mg | ORAL_TABLET | Freq: Every day | ORAL | 2 refills | Status: AC | PRN
Start: 2022-10-24 — End: 2023-11-18
  Filled 2022-10-24: qty 30, 30d supply, fill #0
  Filled 2023-10-19: qty 30, 30d supply, fill #1

## 2022-10-24 MED FILL — Sildenafil Citrate Tab 100 MG: ORAL | 30 days supply | Qty: 10 | Fill #0 | Status: CN

## 2022-10-24 NOTE — Progress Notes (Signed)
Date:  10/24/2022   Name:  Jimmy Howell   DOB:  [birth year]   MRN:  [id number]   Chief Complaint: Establish Care  HPI Jimmy Howell is a very pleasant 52 year old male with a history of COPD, fatty liver, degenerative disc disease, BPH, pre-DM, class I obesity, HLD, and borderline HTN who presents new to the clinic today to establish care, joined by his wife Jimmy Howell.  Labs from January 2024 reviewed by me, normal.  Switching primary care to this clinic due to proximity to home.  COPD managed by pulmonology with once yearly visits.  Recent lung CT April 2024 with nodules, follow-up chest CT planned in about a week.  Borderline HTN over the years, has never taken medication for this problem.  Does not monitor at home.  Chronic lumbar back pain with occasional flareups, requesting refill on meloxicam and Robaxin for as needed use only.  Various lumps of skin he would like evaluated.  Has never seen a dermatologist.  Jimmy Howell voices some concern for Jimmy Howell's central abdominal weight gain.  He drinks mainly water and diet sodas.  Drinks fairly heavily on the weekends, usually 6-8 beers on Fridays and Saturdays.  He is not particularly active.  Medication list has been reviewed and updated.  Current Meds  Medication Sig   albuterol (VENTOLIN HFA) 108 (90 Base) MCG/ACT inhaler Inhale 2 puffs into the lungs every 4 (four) hours as needed for wheezing or shortness of breath.   olopatadine (PATADAY) 0.1 % ophthalmic solution Place 1 drop into both eyes 2 (two) times daily.   omeprazole (PRILOSEC) 20 MG capsule Take 1 capsule (20 mg total) by mouth daily.   rosuvastatin (CRESTOR) 20 MG tablet Take 1 tablet (20 mg total) by mouth daily.   sildenafil (VIAGRA) 100 MG tablet Take 0.5-1 tablets (50-100 mg total) by mouth daily as needed for erectile dysfucntion.   umeclidinium-vilanterol (ANORO ELLIPTA) 62.5-25 MCG/ACT AEPB Inhale 1 puff into the lungs daily.   [DISCONTINUED] diazepam (VALIUM) 5 MG tablet Take 1  tablet by mouth 30 minutes prior to MRI scan. May repeat once right before MRI scan if needed. You will need a driver.   Current Facility-Administered Medications for the 10/24/22 encounter (Office Visit) with Remo Lipps, PA  Medication   ipratropium-albuterol (DUONEB) 0.5-2.5 (3) MG/3ML nebulizer solution 3 mL     Review of Systems  Constitutional:  Negative for fatigue and fever.  Respiratory:  Negative for chest tightness and shortness of breath.   Cardiovascular:  Negative for chest pain and palpitations.  Gastrointestinal:  Negative for abdominal pain.    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   History of 2019 novel coronavirus disease (COVID-19) 08/20/2018   Hyperlipidemia 04/19/2018   Obesity (BMI 30.0-34.9) 06/17/2017   Fatty liver 06/17/2017   Decreased sex drive 76/28/3151   Anxiety and depression 10/23/2015   Alcohol abuse 10/23/2015   Atherosclerosis of native coronary artery of native heart without angina pectoris 08/03/2015   Acid reflux 07/09/2015   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   Low back pain 03/22/2015   Lung nodules 02/19/2015   COPD (chronic obstructive pulmonary disease) (HCC) 02/19/2015   BPH (benign prostatic hyperplasia) 02/19/2015   Liver lesion 02/19/2015   Degeneration of intervertebral disc of cervical region 11/21/2013   Cervical spinal stenosis 11/21/2013  Back ache 08/31/2013    No Known Allergies  Immunization History  Administered Date(s) Administered   Hep A / Hep B 10/06/2017, 11/09/2017, 02/23/2020   Influenza,inj,Quad PF,6+ Mos 10/27/2017, 11/08/2018, 12/08/2019   Moderna Sars-Covid-2 Vaccination 09/02/2019, 10/02/2019    Past Surgical History:  Procedure Laterality Date   ESOPHAGOGASTRODUODENOSCOPY (EGD) WITH  PROPOFOL N/A 08/10/2020   Procedure: ESOPHAGOGASTRODUODENOSCOPY (EGD) WITH PROPOFOL;  Surgeon: Wyline Mood, MD;  Location: [facility] ENDOSCOPY;  Service: Gastroenterology;  Laterality: N/A;   FINGER SURGERY     HERNIA REPAIR     Inguinal Hernia Repair   VIDEO ASSISTED THORACOSCOPY (VATS)/THOROCOTOMY Right 08/20/2015   Procedure: PREOP BRONCH, THORACOTOMY, RIGHT UPPER LOBECTOMY;  Surgeon: Hulda Marin, MD;  Location: [facility] ORS;  Service: General;  Laterality: Right;    Social History   Tobacco Use   Smoking status: Former    Current packs/day: 0.00    Average packs/day: 2.0 packs/day for 30.0 years (60.0 ttl pk-yrs)    Types: Cigarettes    Start date: Howell/01/1985    Quit date: 08/23/2015    Years since quitting: Howell.1   Smokeless tobacco: Never  Vaping Use   Vaping status: Never Used  Substance Use Topics   Alcohol use: Yes    Alcohol/week: 12.0 standard drinks of alcohol    Types: 12 Cans of beer per week    Comment: 1 Case of Beer / Weekly   Drug use: Never    Family History  Problem Relation Age of Onset   Hypertension Mother    Arthritis Mother    COPD Mother    Stroke Mother    Hypertension Father    Bladder Cancer Father    Asthma Father    Cancer Paternal Aunt        stomach   Arthritis Other        Parent   Hyperlipidemia Other        Parent   Heart disease Other        Parent   Hypertension Other        Parent   Kidney disease Other        Parent        06/06/2022    2:50 PM 01/27/2022    1:32 PM 10/23/2015    3:28 PM  Jimmy Howell : Generalized Anxiety Score  Nervous, Anxious, on Edge 0 0 3  Control/stop worrying 0 0 3  Worry too much - different things 0 0 2  Trouble relaxing 0 0 3  Restless 0 0 1  Easily annoyed or irritable 0 0 3  Afraid - awful might happen 0 0 0  Total Jimmy Howell Score 0 0 15  Anxiety Difficulty Not difficult at all Not difficult at all        06/06/2022    2:50 PM 01/27/2022    1:31 PM 05/31/2021    4:26 PM  Depression screen PHQ 2/9   Decreased Interest 0 0 0  Down, Depressed, Hopeless 0 0 0  PHQ - 2 Score 0 0 0  Altered sleeping 0    Tired, decreased energy 0    Change in appetite 0    Feeling bad or failure about yourself  0    Trouble concentrating 0    Moving slowly or fidgety/restless 0    Suicidal thoughts 0    PHQ-9 Score 0    Difficult doing work/chores Not difficult at all      BP Readings from Last 3 Encounters:  10/24/22  132/76  06/06/22 124/80  03/27/22 138/80    Wt Readings from Last 3 Encounters:  10/24/22 215 lb (97.5 kg)  06/06/22 218 lb 9.6 oz (99.2 kg)  03/27/22 216 lb (98 kg)    BP 132/76 (BP Location: Left Arm, Patient Position: Sitting, Cuff Size: Large)   Pulse 81   Ht 5\' 9"  (1.753 m)   Wt 215 lb (97.5 kg)   SpO2 95%   BMI 31.75 kg/m   Physical Exam Vitals and nursing note reviewed.  Constitutional:      Appearance: Normal appearance.  Cardiovascular:     Rate and Rhythm: Normal rate and regular rhythm.     Heart sounds: No murmur heard.    No friction rub. No gallop.  Pulmonary:     Effort: Pulmonary effort is normal.     Breath sounds: Decreased breath sounds present. No wheezing, rhonchi or rales.  Abdominal:     General: Abdomen is protuberant. There is no distension.     Palpations: Abdomen is soft.     Tenderness: There is no abdominal tenderness.     Hernia: A hernia is present. Hernia is present in the umbilical area.  Musculoskeletal:        General: Normal range of motion.  Skin:    General: Skin is warm and dry.     Comments: -- Soft mobile 4 cm mass of the mid back likely lipoma.   --1.5 cm semifirm cystic mass of the left inferior neck.   --2 cm semifirm mobile mass just superior to the left nipple with localized skin atrophy/scarring presumably from previous attempts at draining. --4 mm pale-blue papule of the back speckled with 4 black spots  Neurological:     Mental Status: He is alert and oriented to person, place, and time.     Gait: Gait is  intact.  Psychiatric:        Mood and Affect: Mood and affect normal.     Recent Labs     Component Value Date/Time   NA 140 01/27/2022 1401   K 3.9 01/27/2022 1401   CL 103 01/27/2022 1401   CO2 26 01/27/2022 1401   GLUCOSE 95 01/27/2022 1401   BUN 13 01/27/2022 1401   CREATININE 0.70 01/27/2022 1401   CALCIUM 9.5 01/27/2022 1401   PROT Howell.0 01/27/2022 1401   ALBUMIN 4.6 01/27/2022 1401   AST 20 01/27/2022 1401   ALT 36 01/27/2022 1401   ALKPHOS 51 01/27/2022 1401   BILITOT 0.5 01/27/2022 1401   GFRNONAA >60 08/20/2015 1539   GFRAA >60 08/20/2015 1539    Lab Results  Component Value Date   WBC 16.2 (H) 08/20/2015   HGB 16.0 08/20/2015   HCT 46.Howell 08/20/2015   MCV 90.5 08/20/2015   PLT 197 08/20/2015   Lab Results  Component Value Date   HGBA1C 5.9 01/27/2022   Lab Results  Component Value Date   CHOL 109 01/27/2022   HDL 38.20 (L) 01/27/2022   LDLCALC 48 01/27/2022   LDLDIRECT 56.0 02/17/2018   TRIG 113.0 01/27/2022   CHOLHDL 3 01/27/2022   No results found for: "TSH"   Assessment and Plan:  1. Primary hypertension Elevated x 2 in clinic today.  Chart review shows borderline hypertensive readings over the years.  Advised lifestyle changes with home BP monitoring with a log for my review next time.  Patient is to let me know if blood pressure is consistently running greater than 140/90.  2. Chronic bilateral low back  pain without sciatica Refill meloxicam and methocarbamol as below for as needed use - meloxicam (MOBIC) 15 MG tablet; Take 1 tablet (15 mg total) by mouth daily as needed for pain. Take with food. Do not take with any other NSAIDs (ibuprofen, naproxen, aspirin). Tylenol is okay.  Dispense: 30 tablet; Refill: 2 - methocarbamol (ROBAXIN) 500 MG tablet; Take 1 tablet (500 mg total) by mouth every 8 (eight) hours as needed for muscle spasms.  Dispense: 30 tablet; Refill: 3  3. Obesity (BMI 30.0-34.9) Encouraged lifestyle changes for now, namely  nutritional improvement and routine physical activity.  Alcohol reduction will also help with this quite a bit.  I do suspect that his rounded abdomen on exam today is a combination of central obesity, bloating from diet sodas, and alcohol use.  4. Alcohol abuse Advised to reduce alcohol intake.  Plan to check liver enzymes next visit  5. Lipoma of torso Referring to dermatology for consideration of removal.  Patient reassured benign nature of lipomas. - Ambulatory referral to Dermatology  6. Sebaceous cyst Referring for expert opinion on suspected sebaceous cyst of the neck - Ambulatory referral to Dermatology  Howell. Mass of upper inner quadrant of left breast Uncertain etiology, but it does seem subcutaneous.  Considered breast imaging with ultrasound, but will first send to dermatology for their opinion. - Ambulatory referral to Dermatology  8. Pigmented skin lesion of uncertain nature Small lesion of the back, interesting color and pigmentation.  Suspect some type of nevus. - Ambulatory referral to Dermatology  9. Diastasis recti Patient would benefit from core exercises  10. Umbilical hernia without obstruction and without gangrene Not particularly bothersome at this time.  Advised to work on weight loss.  Will follow clinically.   Return in about 4 months (around 02/24/2023) for fasting CPE.    Alvester Morin, PA-C, DMSc, Nutritionist [facility] [phone]

## 2022-10-24 NOTE — Patient Instructions (Addendum)
-  It was a pleasure to see you today! Please review your visit summary for helpful information -I would encourage you to follow your care via MyChart where you can access lab results, notes, messages, and more -If you feel that we did a nice job today, please complete your after-visit survey and leave [place] a Google review! Your CMA today was Mariann Barter and your provider was Alvester Morin, PA-C, DMSc -Please return for follow-up in about 4 months  Remember the "Seven S's" of hypertension including salt, smoking, stimulants (e.g. caffeine), stress, sleep, snoring (OSA), sedentary lifestyle.

## 2022-10-27 ENCOUNTER — Other Ambulatory Visit: Payer: Self-pay

## 2022-10-27 MED FILL — Sildenafil Citrate Tab 100 MG: ORAL | 10 days supply | Qty: 10 | Fill #0 | Status: AC

## 2022-11-03 ENCOUNTER — Ambulatory Visit
Admission: RE | Admit: 2022-11-03 | Discharge: 2022-11-03 | Disposition: A | Discharge status: Home / Self Care | Payer: BC Managed Care – PPO | Source: Ambulatory Visit | Attending: Acute Care | Admitting: Acute Care

## 2022-11-03 DIAGNOSIS — R911 Solitary pulmonary nodule: Secondary | ICD-10-CM | POA: Diagnosis present

## 2022-11-03 DIAGNOSIS — Z87891 Personal history of nicotine dependence: Secondary | ICD-10-CM | POA: Diagnosis present

## 2022-11-11 ENCOUNTER — Ambulatory Visit: Payer: BC Managed Care – PPO | Admitting: Dermatology

## 2022-11-11 ENCOUNTER — Encounter: Payer: Self-pay | Admitting: Dermatology

## 2022-11-11 DIAGNOSIS — D492 Neoplasm of unspecified behavior of bone, soft tissue, and skin: Secondary | ICD-10-CM

## 2022-11-11 DIAGNOSIS — L700 Acne vulgaris: Secondary | ICD-10-CM | POA: Diagnosis not present

## 2022-11-11 DIAGNOSIS — L905 Scar conditions and fibrosis of skin: Secondary | ICD-10-CM

## 2022-11-11 DIAGNOSIS — L720 Epidermal cyst: Secondary | ICD-10-CM

## 2022-11-11 DIAGNOSIS — L821 Other seborrheic keratosis: Secondary | ICD-10-CM

## 2022-11-11 NOTE — Progress Notes (Signed)
   New Patient Visit   Subjective  Jimmy Howell is a 52 y.o. male who presents for the following: Spots on back. Lipoma, cysts, moles. PCP recommended having checked. Denies inflammation or draining from areas.   The patient has spots, moles and lesions to be evaluated, some may be new or changing and the patient may have concern these could be cancer.  This patient is accompanied in the office by his  wife, [name] .   The following portions of the chart were reviewed this encounter and updated as appropriate: medications, allergies, medical history  Review of Systems:  No other skin or systemic complaints except as noted in HPI or Assessment and Plan.  Objective  Well appearing patient in no apparent distress; mood and affect are within normal limits.  A focused examination was performed of the following areas: Back, chest  Relevant physical exam findings are noted in the Assessment and Plan.  Left medial Breast Subcutaneous nodule with atrophic scar like plaques at 9 and 3 o clock         Assessment & Plan   Neoplasm of skin Left medial Breast  Skin / nail biopsy Type of biopsy: punch   Informed consent: discussed and consent obtained   Patient was prepped and draped in usual sterile fashion: Area prepped with alcohol. Anesthesia: the lesion was anesthetized in a standard fashion   Anesthetic:  1% lidocaine w/ epinephrine 1-100,000 buffered w/ 8.4% NaHCO3 Punch size:  4 mm Suture size:  4-0 Suture type: nylon   Hemostasis achieved with: suture and pressure   Outcome: patient tolerated procedure well   Post-procedure details: wound care instructions given   Post-procedure details comment:  Ointment and small bandage applied Additional details:  Wife will remove sutures in 7 days.  Specimen 1 - Surgical pathology Differential Diagnosis: EIC with scar vs dermatofibroma vs DFSP vs lichen sclerosus  Check Margins: No  EIC (epidermal inclusion cyst)  Open  comedone  Seborrheic keratoses  EPIDERMAL INCLUSION CYST/open comedones Exam: Subcutaneous nodules at back, left chest  Benign-appearing. Exam most consistent with an epidermal inclusion cyst. Discussed that a cyst is a benign growth that can grow over time and sometimes get irritated or inflamed. Recommend observation if it is not bothersome. Discussed option of surgical excision to remove it if it is growing, symptomatic, or other changes noted. Please call for new or changing lesions so they can be evaluated.   SEBORRHEIC KERATOSIS - Stuck-on, waxy, tan-brown papules and/or plaques at face, arms. - Benign-appearing - Discussed benign etiology and prognosis. - Observe - Call for any changes    Return if symptoms worsen or fail to improve.  I, Lawson Radar, CMA, am acting as scribe for Elie Goody, MD.   Documentation: I have reviewed the above documentation for accuracy and completeness, and I agree with the above.  Elie Goody, MD

## 2022-11-11 NOTE — Patient Instructions (Addendum)
Cysts on back: Benign-appearing. Exam most consistent with an epidermal inclusion cyst. Discussed that a cyst is a benign growth that can grow over time and sometimes get irritated or inflamed. Recommend observation if it is not bothersome. Discussed option of surgical excision to remove it if it is growing, symptomatic, or other changes noted. Please call for new or changing lesions so they can be evaluated.  Remove sutures in 7 days.      Wound Care Instructions  Cleanse wound gently with soap and water once a day then pat dry with clean gauze. Apply a thin coat of Petrolatum (petroleum jelly, "Vaseline") over the wound (unless you have an allergy to this). We recommend that you use a new, sterile tube of Vaseline. Do not pick or remove scabs. Do not remove the yellow or white "healing tissue" from the base of the wound.  Cover the wound with fresh, clean, nonstick gauze and secure with paper tape. You may use Band-Aids in place of gauze and tape if the wound is small enough, but would recommend trimming much of the tape off as there is often too much. Sometimes Band-Aids can irritate the skin.  You should call the office for your biopsy report after 1 week if you have not already been contacted.  If you experience any problems, such as abnormal amounts of bleeding, swelling, significant bruising, significant pain, or evidence of infection, please call the office immediately.  FOR ADULT SURGERY PATIENTS: If you need something for pain relief you may take 1 extra strength Tylenol (acetaminophen) AND 2 Ibuprofen (200mg  each) together every 4 hours as needed for pain. (do not take these if you are allergic to them or if you have a reason you should not take them.) Typically, you may only need pain medication for 1 to 3 days.      Recommend daily broad spectrum sunscreen SPF 30+ to sun-exposed areas, reapply every 2 hours as needed. Call for new or changing lesions.  Staying in the shade or  wearing long sleeves, sun glasses (UVA+UVB protection) and wide brim hats (4-inch brim around the entire circumference of the hat) are also recommended for sun protection.     Due to recent changes in healthcare laws, you may see results of your pathology and/or laboratory studies on MyChart before the doctors have had a chance to review them. We understand that in some cases there may be results that are confusing or concerning to you. Please understand that not all results are received at the same time and often the doctors may need to interpret multiple results in order to provide you with the best plan of care or course of treatment. Therefore, we ask that you please give [place] 2 business days to thoroughly review all your results before contacting the office for clarification. Should we see a critical lab result, you will be contacted sooner.   If You Need Anything After Your Visit  If you have any questions or concerns for your doctor, please call our main line at [phone] and press option 4 to reach your doctor's medical assistant. If no one answers, please leave a voicemail as directed and we will return your call as soon as possible. Messages left after 4 pm will be answered the following business day.   You may also send [place] a message via MyChart. We typically respond to MyChart messages within 1-2 business days.  For prescription refills, please ask your pharmacy to contact our office. Our fax number is  [phone].  If you have an urgent issue when the clinic is closed that cannot wait until the next business day, you can page your doctor at the number below.    Please note that while we do our best to be available for urgent issues outside of office hours, we are not available 24/7.   If you have an urgent issue and are unable to reach [place], you may choose to seek medical care at your doctor's office, retail clinic, urgent care center, or emergency room.  If you have a medical  emergency, please immediately call 911 or go to the emergency department.  Pager Numbers  - Dr. Gwen Pounds: [phone]  - Dr. Roseanne Reno: [phone]  - Dr. Katrinka Blazing: [phone]   In the event of inclement weather, please call our main line at [phone] for an update on the status of any delays or closures.  Dermatology Medication Tips: Please keep the boxes that topical medications come in in order to help keep track of the instructions about where and how to use these. Pharmacies typically print the medication instructions only on the boxes and not directly on the medication tubes.   If your medication is too expensive, please contact our office at [phone] option 4 or send [place] a message through MyChart.   We are unable to tell what your co-pay for medications will be in advance as this is different depending on your insurance coverage. However, we may be able to find a substitute medication at lower cost or fill out paperwork to get insurance to cover a needed medication.   If a prior authorization is required to get your medication covered by your insurance company, please allow [place] 1-2 business days to complete this process.  Drug prices often vary depending on where the prescription is filled and some pharmacies may offer cheaper prices.  The website www.goodrx.com contains coupons for medications through different pharmacies. The prices here do not account for what the cost may be with help from insurance (it may be cheaper with your insurance), but the website can give you the price if you did not use any insurance.  - You can print the associated coupon and take it with your prescription to the pharmacy.  - You may also stop by our office during regular business hours and pick up a GoodRx coupon card.  - If you need your prescription sent electronically to a different pharmacy, notify our office through [facility] or by phone at [phone] option 4.     Si Usted  Necesita Algo Despus de Su Visita  Tambin puede enviarnos un mensaje a travs de Clinical cytogeneticist. Por lo general respondemos a los mensajes de MyChart en el transcurso de 1 a 2 das hbiles.  Para renovar recetas, por favor pida a su farmacia que se ponga en contacto con nuestra oficina. Annie Sable de fax es [place] [phone].  Si tiene un asunto urgente cuando la clnica est cerrada y que no puede esperar hasta el siguiente da hbil, puede llamar/localizar a su doctor(a) al nmero que aparece a continuacin.   Por favor, tenga en cuenta que aunque hacemos todo lo posible para estar disponibles para asuntos urgentes fuera del horario de [place], no estamos disponibles las 24 horas del da, los 7 [address]  [address] de la [place].   Si tiene un problema urgente y no puede comunicarse con nosotros, puede optar por buscar atencin mdica  en el consultorio de su doctor(a), en una clnica privada, en un centro de atencin  urgente o en una sala de emergencias.  Si tiene Engineer, drilling, por favor llame inmediatamente al 911 o vaya a la sala de emergencias.  Nmeros de bper  - Dr. Gwen Pounds: [phone]  - Dra. Roseanne Reno: [phone]  - Dr. Katrinka Blazing: [phone]   En caso de inclemencias del tiempo, por favor llame a Lacy Duverney principal al [phone] para una actualizacin sobre el [place] de cualquier retraso o cierre.  Consejos para la medicacin en dermatologa: Por favor, guarde las cajas en las que vienen los medicamentos de uso tpico para ayudarle a seguir las instrucciones sobre dnde y cmo usarlos. Las farmacias generalmente imprimen las instrucciones del medicamento slo en las cajas y no directamente en los tubos del [place].   Si su medicamento es muy caro, por favor, pngase en contacto con Rolm Gala llamando al [phone] y presione la opcin 4 o envenos un mensaje a travs de Clinical cytogeneticist.   No podemos decirle cul ser su copago por los medicamentos por adelantado ya que esto  es diferente dependiendo de la cobertura de su seguro. Sin embargo, es posible que podamos encontrar un medicamento sustituto a Audiological scientist un formulario para que el seguro cubra el medicamento que se considera necesario.   Si se requiere una autorizacin previa para que su compaa de seguros [place] su medicamento, por favor permtanos de 1 a 2 das hbiles para completar [address]  Los precios de los medicamentos varan con frecuencia dependiendo del Environmental consultant de dnde se surte la receta y alguna farmacias pueden ofrecer precios ms baratos.  El sitio web www.goodrx.com tiene cupones para medicamentos de Health and safety inspector. Los precios aqu no tienen en cuenta lo que podra costar con la ayuda del seguro (puede ser ms barato con su seguro), pero el sitio web puede darle el precio si no utiliz Tourist information centre manager.  - Puede imprimir el cupn correspondiente y llevarlo con su receta a la farmacia.  - Tambin puede pasar por nuestra oficina durante el horario de atencin regular y Education officer, museum una tarjeta de cupones de GoodRx.  - Si necesita que su receta se enve electrnicamente a una farmacia diferente, informe a nuestra oficina a travs de MyChart de [place] o por telfono llamando al [phone] y presione la opcin 4.

## 2022-11-19 LAB — SURGICAL PATHOLOGY

## 2022-11-23 ENCOUNTER — Other Ambulatory Visit: Payer: Self-pay | Admitting: Family Medicine

## 2022-11-23 ENCOUNTER — Other Ambulatory Visit: Payer: Self-pay | Admitting: Internal Medicine

## 2022-11-23 DIAGNOSIS — N529 Male erectile dysfunction, unspecified: Secondary | ICD-10-CM

## 2022-11-24 ENCOUNTER — Other Ambulatory Visit: Payer: Self-pay

## 2022-11-24 MED ORDER — ANORO ELLIPTA 62.5-25 MCG/ACT IN AEPB
1.0000 | INHALATION_SPRAY | Freq: Every day | RESPIRATORY_TRACT | 1 refills | Status: DC
  Filled 2022-11-24: qty 60, 30d supply, fill #0
  Filled 2022-12-23: qty 60, 30d supply, fill #1

## 2022-11-25 ENCOUNTER — Other Ambulatory Visit: Payer: Self-pay

## 2022-11-25 MED ORDER — SILDENAFIL CITRATE 100 MG PO TABS
50.0000 mg | ORAL_TABLET | Freq: Every day | ORAL | 0 refills | Status: DC | PRN
Start: 2022-11-25 — End: 2022-12-24
  Filled 2022-11-25: qty 10, 10d supply, fill #0

## 2022-11-27 ENCOUNTER — Other Ambulatory Visit: Payer: Self-pay

## 2022-11-27 DIAGNOSIS — Z122 Encounter for screening for malignant neoplasm of respiratory organs: Secondary | ICD-10-CM

## 2022-11-27 DIAGNOSIS — Z87891 Personal history of nicotine dependence: Secondary | ICD-10-CM

## 2022-12-01 ENCOUNTER — Other Ambulatory Visit: Payer: Self-pay

## 2022-12-01 ENCOUNTER — Encounter: Payer: Self-pay | Admitting: Physician Assistant

## 2022-12-01 ENCOUNTER — Ambulatory Visit: Payer: BC Managed Care – PPO | Admitting: Physician Assistant

## 2022-12-01 VITALS — BP 110/84 | HR 78 | Temp 97.8°F | Ht 69.0 in | Wt 212.0 lb

## 2022-12-01 DIAGNOSIS — M25511 Pain in right shoulder: Secondary | ICD-10-CM | POA: Diagnosis not present

## 2022-12-01 MED ORDER — IBUPROFEN 800 MG PO TABS
800.0000 mg | ORAL_TABLET | Freq: Three times a day (TID) | ORAL | 0 refills | Status: AC | PRN
Start: 2022-12-01 — End: 2022-12-06
  Filled 2022-12-01: qty 15, 5d supply, fill #0

## 2022-12-01 NOTE — Progress Notes (Signed)
Date:  12/01/2022   Name:  Jimmy Howell   DOB:  08-Jan-1970   MRN:  [id number]   Chief Complaint: Shoulder Pain (X3 day,Right shoulder muscle/joint pain. Previous similar pain in the past.Taken muscle relaxers, meloxicam, Tylenol without relief., aching and burning, had xrays 1 year ago, finger tips get numb at times, 8 pain scale)  Shoulder Pain  The pain is present in the right shoulder. This is a new problem. Episode onset: X3 days. There has been no history of extremity trauma. The problem occurs constantly. The quality of the pain is described as aching and burning. The pain is at a severity of 8/10. The pain is moderate. Associated symptoms include numbness and tingling. Associated symptoms comments: In fingers. He has tried NSAIDS, OTC pain meds, acetaminophen and cold (meloxicam) for the symptoms. The treatment provided no relief.   Jimmy Howell presents today for 2-day atraumatic flare of right shoulder pain, endorsing a history of this issue. He woke up Saturday with mild pain, put together some patio furniture, and Sunday pain was worse.    Most recent evaluation concerning this issue that I can tell was [facility] December 2023, felt to be secondary to impingement versus rotator cuff arthropathy.  He was referred to PT and given ibuprofen 800 mg, with recommendation to pursue right shoulder MRI if pain did not improve.  No MRI was performed.  ROM somewhat limited.    Medication list has been reviewed and updated.  Current Meds  Medication Sig   albuterol (VENTOLIN HFA) 108 (90 Base) MCG/ACT inhaler Inhale 2 puffs into the lungs every 4 (four) hours as needed for wheezing or shortness of breath.   ibuprofen (ADVIL) 800 MG tablet Take 1 tablet (800 mg total) by mouth every 8 (eight) hours as needed for up to 5 days. Temporarily PAUSE MELOXICAM and any other NSAIDs (naproxen, aspirin, etc) while using this.   meloxicam (MOBIC) 15 MG tablet Take 1 tablet (15 mg total) by mouth daily as  needed for pain. Take with food. Do not take with any other NSAIDs (ibuprofen, naproxen, aspirin). Tylenol is okay.   methocarbamol (ROBAXIN) 500 MG tablet Take 1 tablet (500 mg total) by mouth every 8 (eight) hours as needed for muscle spasms.   olopatadine (PATADAY) 0.1 % ophthalmic solution Place 1 drop into both eyes 2 (two) times daily.   omeprazole (PRILOSEC) 20 MG capsule Take 1 capsule (20 mg total) by mouth daily.   rosuvastatin (CRESTOR) 20 MG tablet Take 1 tablet (20 mg total) by mouth daily.   sildenafil (VIAGRA) 100 MG tablet Take 0.5-1 tablets (50-100 mg total) by mouth daily as needed for erectile dysfunction.   umeclidinium-vilanterol (ANORO ELLIPTA) 62.5-25 MCG/ACT AEPB Inhale 1 puff into the lungs daily.   Current Facility-Administered Medications for the 12/01/22 encounter (Office Visit) with Remo Lipps, PA  Medication   ipratropium-albuterol (DUONEB) 0.5-2.5 (3) MG/3ML nebulizer solution 3 mL     Review of Systems  Neurological:  Positive for tingling and numbness.    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   History of 2019 novel coronavirus disease (COVID-19) 08/20/2018   Hyperlipidemia 04/19/2018   Obesity (BMI  30.0-34.9) 06/17/2017   Fatty liver 06/17/2017   Decreased sex drive 16/10/9602   Anxiety and depression 10/23/2015   Alcohol abuse 10/23/2015   Atherosclerosis of native coronary artery of native heart without angina pectoris 08/03/2015   Acid reflux 07/09/2015   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   Low back pain 03/22/2015   Lung nodules 02/19/2015   COPD (chronic obstructive  pulmonary disease) (HCC) 02/19/2015   BPH (benign prostatic hyperplasia) 02/19/2015   Liver lesion 02/19/2015   Degeneration of intervertebral disc of cervical region 11/21/2013   Cervical spinal stenosis 11/21/2013   Back ache 08/31/2013    No Known Allergies  Immunization History  Administered Date(s) Administered   Hep A / Hep B 10/06/2017, 11/09/2017, 02/23/2020   Influenza,inj,Quad PF,6+ Mos 10/27/2017, 11/08/2018, 12/08/2019   Moderna Sars-Covid-2 Vaccination 09/02/2019, 10/02/2019    Past Surgical History:  Procedure Laterality Date   ESOPHAGOGASTRODUODENOSCOPY (EGD) WITH PROPOFOL N/A 08/10/2020   Procedure: ESOPHAGOGASTRODUODENOSCOPY (EGD) WITH PROPOFOL;  Surgeon: Wyline Mood, MD;  Location: [facility] ENDOSCOPY;  Service: Gastroenterology;  Laterality: N/A;   FINGER SURGERY     HERNIA REPAIR     Inguinal Hernia Repair   VIDEO ASSISTED THORACOSCOPY (VATS)/THOROCOTOMY Right 08/20/2015   Procedure: PREOP BRONCH, THORACOTOMY, RIGHT UPPER LOBECTOMY;  Surgeon: Hulda Marin, MD;  Location: [facility] ORS;  Service: General;  Laterality: Right;    Social History   Tobacco Use   Smoking status: Former    Current packs/day: 0.00    Average packs/day: 2.0 packs/day for 30.0 years (60.0 ttl pk-yrs)    Types: Cigarettes    Start date: 07/06/1985    Quit date: 08/23/2015    Years since quitting: 7.2   Smokeless tobacco: Never  Vaping Use   Vaping status: Never Used  Substance Use Topics   Alcohol use: Yes    Alcohol/week: 12.0 standard drinks of alcohol    Types: 12 Cans of beer per week    Comment: 1 Case of Beer / Weekly   Drug use: Never    Family History  Problem Relation Age of Onset   Hypertension Mother    Arthritis Mother    COPD Mother    Stroke Mother    Hypertension Father    Bladder Cancer Father    Asthma Father    Cancer Paternal Aunt        stomach   Arthritis Other        Parent   Hyperlipidemia Other        Parent   Heart disease Other        Parent    Hypertension Other        Parent   Kidney disease Other        Parent        12/01/2022    2:02 PM 06/06/2022    2:50 PM 01/27/2022    1:32 PM 10/23/2015    3:28 PM  GAD 7 : Generalized Anxiety Score  Nervous, Anxious, on Edge 0 0 0 3  Control/stop worrying 0 0 0 3  Worry too much - different things 0 0 0 2  Trouble relaxing 0 0 0 3  Restless 0 0 0 1  Easily annoyed or irritable 0 0 0 3  Afraid - awful might happen 0 0 0 0  Total GAD 7 Score 0 0 0 15  Anxiety Difficulty Not difficult at all Not difficult at all Not difficult at all        12/01/2022  2:01 PM 06/06/2022    2:50 PM 01/27/2022    1:31 PM  Depression screen PHQ 2/9  Decreased Interest 0 0 0  Down, Depressed, Hopeless 0 0 0  PHQ - 2 Score 0 0 0  Altered sleeping 0 0   Tired, decreased energy 0 0   Change in appetite 0 0   Feeling bad or failure about yourself  0 0   Trouble concentrating 0 0   Moving slowly or fidgety/restless 0 0   Suicidal thoughts 0 0   PHQ-9 Score 0 0   Difficult doing work/chores Not difficult at all Not difficult at all     BP Readings from Last 3 Encounters:  12/01/22 110/84  10/24/22 132/76  06/06/22 124/80    Wt Readings from Last 3 Encounters:  12/01/22 212 lb (96.2 kg)  10/24/22 215 lb (97.5 kg)  06/06/22 218 lb 9.6 oz (99.2 kg)    BP 110/84   Pulse 78   Temp 97.8 F (36.6 C) (Oral)   Ht 5\' 9"  (1.753 m)   Wt 212 lb (96.2 kg)   SpO2 95%   BMI 31.31 kg/m   Physical Exam Vitals and nursing note reviewed.  Constitutional:      Appearance: Normal appearance.  Cardiovascular:     Rate and Rhythm: Normal rate.  Pulmonary:     Effort: Pulmonary effort is normal.  Abdominal:     General: There is no distension.  Musculoskeletal:        General: Normal range of motion.     Comments: TTP posterior right deltoid. ROM slightly limited in flexion, abduction, and external rotation.  Skin:    General: Skin is warm and dry.  Neurological:     Mental Status: He is  alert and oriented to person, place, and time.     Gait: Gait is intact.  Psychiatric:        Mood and Affect: Mood and affect normal.     Recent Labs     Component Value Date/Time   NA 140 01/27/2022 1401   K 3.9 01/27/2022 1401   CL 103 01/27/2022 1401   CO2 26 01/27/2022 1401   GLUCOSE 95 01/27/2022 1401   BUN 13 01/27/2022 1401   CREATININE 0.70 01/27/2022 1401   CALCIUM 9.5 01/27/2022 1401   PROT 7.0 01/27/2022 1401   ALBUMIN 4.6 01/27/2022 1401   AST 20 01/27/2022 1401   ALT 36 01/27/2022 1401   ALKPHOS 51 01/27/2022 1401   BILITOT 0.5 01/27/2022 1401   GFRNONAA >60 08/20/2015 1539   GFRAA >60 08/20/2015 1539    Lab Results  Component Value Date   WBC 16.2 (H) 08/20/2015   HGB 16.0 08/20/2015   HCT 46.7 08/20/2015   MCV 90.5 08/20/2015   PLT 197 08/20/2015   Lab Results  Component Value Date   HGBA1C 5.9 01/27/2022   Lab Results  Component Value Date   CHOL 109 01/27/2022   HDL 38.20 (L) 01/27/2022   LDLCALC 48 01/27/2022   LDLDIRECT 56.0 02/17/2018   TRIG 113.0 01/27/2022   CHOLHDL 3 01/27/2022   No results found for: "TSH"   Assessment and Plan:  1. Acute pain of right shoulder Likely rotator cuff strain.  Low suspicion for rotator cuff tear without significant inciting injury.  Pause meloxicam, begin ibuprofen 800 mg 3 times daily as needed for a short course of 5 to 7 days, then switch back to meloxicam.  Patient advised not to take the 2 together.  He may use Tylenol or topical analgesics for additional pain relief as desired.  If not improved with this regimen, follow-up with [facility], where he is already established. - ibuprofen (ADVIL) 800 MG tablet; Take 1 tablet (800 mg total) by mouth every 8 (eight) hours as needed for up to 5 days. Temporarily PAUSE MELOXICAM and any other NSAIDs (naproxen, aspirin, etc) while using this.  Dispense: 15 tablet; Refill: 0   Return if symptoms worsen or fail to improve.    Alvester Morin, PA-C, DMSc,  Nutritionist [facility] [phone]

## 2022-12-10 ENCOUNTER — Other Ambulatory Visit: Payer: Self-pay

## 2022-12-10 ENCOUNTER — Other Ambulatory Visit: Payer: Self-pay | Admitting: Family Medicine

## 2022-12-12 ENCOUNTER — Other Ambulatory Visit: Payer: Self-pay

## 2022-12-12 ENCOUNTER — Other Ambulatory Visit: Payer: Self-pay | Admitting: Family Medicine

## 2022-12-16 ENCOUNTER — Other Ambulatory Visit: Payer: Self-pay

## 2022-12-16 MED FILL — Omeprazole Cap Delayed Release 20 MG: ORAL | 90 days supply | Qty: 90 | Fill #0 | Status: AC

## 2022-12-17 ENCOUNTER — Other Ambulatory Visit: Payer: Self-pay

## 2022-12-23 ENCOUNTER — Other Ambulatory Visit: Payer: Self-pay | Admitting: Family Medicine

## 2022-12-23 ENCOUNTER — Other Ambulatory Visit: Payer: Self-pay

## 2022-12-23 ENCOUNTER — Other Ambulatory Visit: Payer: Self-pay | Admitting: Physician Assistant

## 2022-12-23 DIAGNOSIS — N529 Male erectile dysfunction, unspecified: Secondary | ICD-10-CM

## 2022-12-24 ENCOUNTER — Other Ambulatory Visit: Payer: Self-pay

## 2022-12-24 MED FILL — Sildenafil Citrate Tab 100 MG: ORAL | 10 days supply | Qty: 10 | Fill #0 | Status: AC

## 2022-12-24 NOTE — Telephone Encounter (Signed)
Requested medication (s) are due for refill today -yes  Requested medication (s) are on the active medication list -yes  Future visit scheduled -yes  Last refill: 11/25/22 #10  Notes to clinic: outside provider - sent for review   Requested Prescriptions  Pending Prescriptions Disp Refills   sildenafil (VIAGRA) 100 MG tablet 10 tablet 0    Sig: Take 0.5-1 tablets (50-100 mg total) by mouth daily as needed for erectile dysfunction.     Urology: Erectile Dysfunction Agents Passed - 12/24/2022  9:01 AM      Passed - AST in normal range and within 360 days    AST  Date Value Ref Range Status  01/27/2022 20 0 - 37 U/L Final         Passed - ALT in normal range and within 360 days    ALT  Date Value Ref Range Status  01/27/2022 36 0 - 53 U/L Final         Passed - Last BP in normal range    BP Readings from Last 1 Encounters:  12/01/22 110/84         Passed - Valid encounter within last 12 months    Recent Outpatient Visits           3 weeks ago Acute pain of right shoulder   [place] [facility] Mordecai Maes, Melton Alar, [place]   2 months ago Primary hypertension   [place] [facility] at [facility], Melton Alar, [place]       Future Appointments             In 1 month Erin Fulling, MD [facility] at [place]   In 2 months Mordecai Maes, Melton Alar, [place] [facility] Medicine at [facility]               Requested Prescriptions  Pending Prescriptions Disp Refills   sildenafil (VIAGRA) 100 MG tablet 10 tablet 0    Sig: Take 0.5-1 tablets (50-100 mg total) by mouth daily as needed for erectile dysfunction.     Urology: Erectile Dysfunction Agents Passed - 12/24/2022  9:01 AM      Passed - AST in normal range and within 360 days    AST  Date Value Ref Range Status  01/27/2022 20 0 - 37 U/L Final         Passed - ALT in normal range and within 360  days    ALT  Date Value Ref Range Status  01/27/2022 36 0 - 53 U/L Final         Passed - Last BP in normal range    BP Readings from Last 1 Encounters:  12/01/22 110/84         Passed - Valid encounter within last 12 months    Recent Outpatient Visits           3 weeks ago Acute pain of right shoulder   [facility] at [facility], Melton Alar, [place]   2 months ago Primary hypertension   [facility] at [facility], Melton Alar, [place]       Future Appointments             In 1 month Erin Fulling, MD [facility] at [place]   In 2 months Remo Lipps, [place]  [facility] Medicine at [facility]

## 2022-12-24 NOTE — Telephone Encounter (Signed)
Please review.  KP

## 2022-12-25 ENCOUNTER — Other Ambulatory Visit: Payer: Self-pay

## 2023-01-12 ENCOUNTER — Other Ambulatory Visit: Payer: Self-pay | Admitting: Physician Assistant

## 2023-01-12 ENCOUNTER — Other Ambulatory Visit: Payer: Self-pay

## 2023-01-12 DIAGNOSIS — E785 Hyperlipidemia, unspecified: Secondary | ICD-10-CM

## 2023-01-12 MED ORDER — ROSUVASTATIN CALCIUM 20 MG PO TABS
20.0000 mg | ORAL_TABLET | Freq: Every day | ORAL | 1 refills | Status: DC
Start: 2023-01-12 — End: 2023-07-09
  Filled 2023-01-12: qty 90, 90d supply, fill #0
  Filled 2023-04-14: qty 90, 90d supply, fill #1

## 2023-01-13 ENCOUNTER — Other Ambulatory Visit: Payer: Self-pay

## 2023-01-23 ENCOUNTER — Other Ambulatory Visit: Payer: Self-pay

## 2023-01-24 ENCOUNTER — Other Ambulatory Visit: Payer: Self-pay | Admitting: Internal Medicine

## 2023-01-24 MED FILL — Sildenafil Citrate Tab 100 MG: ORAL | 10 days supply | Qty: 10 | Fill #1 | Status: AC

## 2023-01-25 ENCOUNTER — Other Ambulatory Visit: Payer: Self-pay

## 2023-01-25 ENCOUNTER — Other Ambulatory Visit: Payer: Self-pay | Admitting: Internal Medicine

## 2023-01-26 ENCOUNTER — Other Ambulatory Visit: Payer: Self-pay

## 2023-01-26 MED FILL — Umeclidinium-Vilanterol Aero Powd BA 62.5-25 MCG/ACT: RESPIRATORY_TRACT | 30 days supply | Qty: 60 | Fill #0 | Status: AC

## 2023-02-19 ENCOUNTER — Encounter: Payer: Self-pay | Admitting: Internal Medicine

## 2023-02-19 ENCOUNTER — Ambulatory Visit: Payer: 59 | Admitting: Internal Medicine

## 2023-02-19 VITALS — BP 118/78 | HR 83 | Temp 97.5°F | Resp 16 | Ht 69.0 in | Wt 216.4 lb

## 2023-02-19 DIAGNOSIS — J449 Chronic obstructive pulmonary disease, unspecified: Secondary | ICD-10-CM | POA: Diagnosis not present

## 2023-02-19 NOTE — Patient Instructions (Signed)
Plan to use [facility] as needed for wheezing and shortness of breath Albuterol as needed for wheezing and shortness of breath Lung cancer screening program follow up annually   Avoid Allergens and Irritants Avoid secondhand smoke Avoid SICK contacts Recommend  Masking  when appropriate Recommend Keep up-to-date with vaccinations   Recommend weight loss

## 2023-02-19 NOTE — Progress Notes (Signed)
[facility] [place] [facility] Consultation      Date: 02/19/2023,   MRN# [id number] Jimmy Howell 02/06/1970     Admission                  Current  Jimmy Howell is a 53 y.o. old male seen in consultation for COPD and lung nodules at the request of Dr. 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, h/o tobacco abuse and COPD   PFT reviewed 04/2015 Ratio 61% FEV1 54% FVC 69% RV 324% TLC 154% Moderate COPD    CHIEF COMPLAINT:   Follow up COPD Previous h/o MAI infection Previous Long standing smoking history    HISTORY OF PRESENT ILLNESS   Chronic shortness of breath and dyspnea on exertion stable Quit smoking 5 years ago Continues to take Anoro inhaler Helps his breathing really well    No exacerbation at this time No evidence of heart failure at this time No evidence or signs of infection at this time No respiratory distress No fevers, chills, nausea, vomiting, diarrhea No evidence of lower extremity edema No evidence hemoptysis   Albuterol as needed COPD under control   Enrolled in the lung cancer screening program Previous CT scan December 2024-no acute findings   Original weight was 170 now weighs approximately 216 pounds     Current Outpatient Medications:    albuterol (VENTOLIN HFA) 108 (90 Base) MCG/ACT inhaler, Inhale 2 puffs into the lungs every 4 (four) hours as needed for wheezing or shortness of breath., Disp: 6.7 g, Rfl: 11   meloxicam (MOBIC) 15 MG tablet, Take 1 tablet (15 mg total) by mouth daily as needed for pain. Take with food. Do not take with any other NSAIDs (ibuprofen, naproxen, aspirin). Tylenol is okay., Disp: 30 tablet, Rfl: 2   methocarbamol (ROBAXIN) 500 MG tablet, Take 1 tablet (500 mg total) by mouth every 8 (eight) hours as needed for muscle spasms., Disp: 30 tablet, Rfl: 3   olopatadine (PATADAY) 0.1 % ophthalmic solution, Place 1  drop into both eyes 2 (two) times daily., Disp: 5 mL, Rfl: 12   omeprazole (PRILOSEC) 20 MG capsule, Take 1 capsule (20 mg total) by mouth daily., Disp: 90 capsule, Rfl: 1   rosuvastatin (CRESTOR) 20 MG tablet, Take 1 tablet (20 mg total) by mouth daily., Disp: 90 tablet, Rfl: 1   sildenafil (VIAGRA) 100 MG tablet, Take 0.5-1 tablets (50-100 mg total) by mouth daily as needed for erectile dysfunction., Disp: 10 tablet, Rfl: 5   umeclidinium-vilanterol (ANORO ELLIPTA) 62.5-25 MCG/ACT AEPB, Inhale 1 puff into the lungs daily., Disp: 60 each, Rfl: 1  Current Facility-Administered Medications:    ipratropium-albuterol (DUONEB) 0.5-2.5 (3) MG/3ML nebulizer solution 3 mL, 3 mL, Nebulization, Q6H, Arnett, Lyn Records, FNP, 3 mL at 01/01/18 1024    BP 118/78   Pulse 83   Temp (!) 97.5 F (36.4 C) (Temporal)   Resp 16   Ht 5\' 9"  (1.753 m)   Wt 216 lb 6.4 oz (98.2 kg)   SpO2 96%   BMI 31.96 kg/m       Review of Systems: Gen:  Denies  fever, sweats, chills weight loss  HEENT: Denies blurred vision, double vision, ear pain, eye pain, hearing loss, nose bleeds, sore throat Cardiac:  No dizziness, chest pain or heaviness, chest tightness,edema, No JVD Resp:   No cough, -sputum production, -shortness of breath,-wheezing, -hemoptysis,  Other:  All other systems  negative   Physical Examination:   General Appearance: No distress  EYES PERRLA, EOM intact.   NECK Supple, No JVD Pulmonary: normal breath sounds, No wheezing.  CardiovascularNormal S1,S2.  No m/r/g.   Abdomen: Benign, Soft, non-tender. Neurology UE/LE 5/5 strength, no focal deficits Ext pulses intact, cap refill intact ALL OTHER ROS ARE NEGATIVE   CBC    Component Value Date/Time   WBC 16.2 (H) 08/20/2015 1539   RBC 5.16 08/20/2015 1539   HGB 16.0 08/20/2015 1539   HCT 46.7 08/20/2015 1539   PLT 197 08/20/2015 1539   MCV 90.5 08/20/2015 1539   MCH 31.1 08/20/2015 1539   MCHC 34.4 08/20/2015 1539   RDW 12.7 08/20/2015  1539   LYMPHSABS 2.2 07/27/2015 1252   MONOABS 0.8 07/27/2015 1252   EOSABS 0.4 07/27/2015 1252   BASOSABS 0.1 07/27/2015 1252      Latest Ref Rng & Units 01/27/2022    2:01 PM 02/23/2020   12:55 PM 12/18/2017    1:29 PM  BMP  Glucose 70 - 99 mg/dL 95  119  147   BUN 6 - 23 mg/dL 13  15  12    Creatinine 0.40 - 1.50 mg/dL 8.29  5.62  1.30   Sodium 135 - 145 mEq/L 140  140  139   Potassium 3.5 - 5.1 mEq/L 3.9  3.9  3.9   Chloride 96 - 112 mEq/L 103  105  105   CO2 19 - 32 mEq/L 26  28  26    Calcium 8.4 - 10.5 mg/dL 9.5  9.7  9.1     ASSESSMENT/PLAN   53 year old pleasant white male seen today for follow-up assessment for moderate COPD 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 along with 9 months of medical therapy    Assessment of COPD Stable at this time No indication for prednisone or antibiotics Lets plan to use Anoro and albuterol as needed Avoid Allergens and Irritants Avoid secondhand smoke Avoid SICK contacts Recommend  Masking  when appropriate Recommend Keep up-to-date with vaccinations   Lung cancer screening program Follow up annually Recent CT scan did not show any significant abnormalities  Obesity -recommend significant weight loss -recommend changing diet  Deconditioned state -Recommend increased daily activity and exercise   MEDICATION ADJUSTMENTS/LABS AND TESTS ORDERED: Plan to use ANORO as needed for wheezing and shortness of breath Albuterol as needed for wheezing and shortness of breath Lung cancer screening program follow up annually  Avoid Allergens and Irritants Avoid secondhand smoke Avoid SICK contacts Recommend  Masking  when appropriate Recommend Keep up-to-date with vaccinations Recommend weight loss    CURRENT MEDICATIONS REVIEWED AT LENGTH WITH PATIENT TODAY   Patient satisfied with Plan of action and management. All questions answered  Follow-up 6 months   Total time  spent 42 mins   Lianna Sitzmann Santiago Glad, M.D.  [facility]  Medical Director [facility] Medical Director [facility] Cardio-Pulmonary Department

## 2023-02-22 ENCOUNTER — Other Ambulatory Visit: Payer: Self-pay

## 2023-02-22 MED FILL — Sildenafil Citrate Tab 100 MG: ORAL | 10 days supply | Qty: 10 | Fill #2 | Status: AC

## 2023-02-22 MED FILL — Umeclidinium-Vilanterol Aero Powd BA 62.5-25 MCG/ACT: RESPIRATORY_TRACT | 30 days supply | Qty: 60 | Fill #1 | Status: AC

## 2023-02-24 ENCOUNTER — Ambulatory Visit (INDEPENDENT_AMBULATORY_CARE_PROVIDER_SITE_OTHER): Payer: 59 | Admitting: Physician Assistant

## 2023-02-24 ENCOUNTER — Encounter: Payer: Self-pay | Admitting: Physician Assistant

## 2023-02-24 VITALS — BP 132/86 | HR 79 | Temp 98.1°F | Ht 69.0 in | Wt 215.0 lb

## 2023-02-24 DIAGNOSIS — K760 Fatty (change of) liver, not elsewhere classified: Secondary | ICD-10-CM | POA: Diagnosis not present

## 2023-02-24 DIAGNOSIS — E785 Hyperlipidemia, unspecified: Secondary | ICD-10-CM

## 2023-02-24 DIAGNOSIS — Z0000 Encounter for general adult medical examination without abnormal findings: Secondary | ICD-10-CM

## 2023-02-24 DIAGNOSIS — I10 Essential (primary) hypertension: Secondary | ICD-10-CM | POA: Diagnosis not present

## 2023-02-24 DIAGNOSIS — E66811 Obesity, class 1: Secondary | ICD-10-CM

## 2023-02-24 DIAGNOSIS — R7303 Prediabetes: Secondary | ICD-10-CM

## 2023-02-24 DIAGNOSIS — Z125 Encounter for screening for malignant neoplasm of prostate: Secondary | ICD-10-CM

## 2023-02-24 DIAGNOSIS — R21 Rash and other nonspecific skin eruption: Secondary | ICD-10-CM

## 2023-02-24 DIAGNOSIS — Z2821 Immunization not carried out because of patient refusal: Secondary | ICD-10-CM

## 2023-02-24 NOTE — Progress Notes (Signed)
Date:  02/24/2023   Name:  Jimmy Howell   DOB:  [birth year]   MRN:  [id number]   Chief Complaint: Annual Exam  HPI Trey Paula presents today for routine physical with no acute complaints. Recently completed visits with pulmonology and dermatology and I have reviewed these notes; good report from both.  Biopsy of left breast mass revealed benign dermatofibroma/scar.  Patient also has a large lipoma on the back that we have previously discussed, where he may return to dermatology for excision at any time.  History of borderline hypertension, not currently treated with medication, has purchased a home blood pressure cuff with readings generally 110s/80s.  Last Physical: Jan 2024 Last Dental Exam: 2y ago, brushes once in the morning Last Eye Exam: 1y ago Last CRC screen: Cologuard 05/03/20 normal Last LDCT: Oct 2024 Last PSA: Jan 2024    Medication list has been reviewed and updated.  Current Meds  Medication Sig   albuterol (VENTOLIN HFA) 108 (90 Base) MCG/ACT inhaler Inhale 2 puffs into the lungs every 4 (four) hours as needed for wheezing or shortness of breath.   meloxicam (MOBIC) 15 MG tablet Take 1 tablet (15 mg total) by mouth daily as needed for pain. Take with food. Do not take with any other NSAIDs (ibuprofen, naproxen, aspirin). Tylenol is okay.   methocarbamol (ROBAXIN) 500 MG tablet Take 1 tablet (500 mg total) by mouth every 8 (eight) hours as needed for muscle spasms.   olopatadine (PATADAY) 0.1 % ophthalmic solution Place 1 drop into both eyes 2 (two) times daily.   omeprazole (PRILOSEC) 20 MG capsule Take 1 capsule (20 mg total) by mouth daily.   rosuvastatin (CRESTOR) 20 MG tablet Take 1 tablet (20 mg total) by mouth daily.   sildenafil (VIAGRA) 100 MG tablet Take 0.5-1 tablets (50-100 mg total) by mouth daily as needed for erectile dysfunction.   umeclidinium-vilanterol (ANORO ELLIPTA) 62.5-25 MCG/ACT AEPB Inhale 1 puff into the lungs daily.   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   History of 2019 novel coronavirus disease (COVID-19) 08/20/2018   Hyperlipidemia 04/19/2018   Obesity (BMI 30.0-34.9) 06/17/2017   Fatty liver 06/17/2017   Decreased sex drive 40/98/1191   Anxiety and depression 10/23/2015   Alcohol abuse 10/23/2015   Atherosclerosis of native coronary artery of native heart without angina pectoris 08/03/2015   Acid reflux 07/09/2015   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   Low back pain 03/22/2015   Lung nodules 02/19/2015   COPD (chronic obstructive pulmonary disease) (HCC) 02/19/2015   BPH (benign prostatic hyperplasia) 02/19/2015   Liver lesion 02/19/2015   Degeneration of intervertebral disc of cervical region 11/21/2013   Cervical spinal stenosis 11/21/2013   Back ache 08/31/2013    No Known Allergies  Immunization History  Administered Date(s) Administered   Hep A / Hep B 10/06/2017, 11/09/2017, 02/23/2020   Influenza,inj,Quad PF,6+ Mos 10/27/2017, 11/08/2018, 12/08/2019   Moderna Sars-Covid-2 Vaccination 09/02/2019, 10/02/2019    Past Surgical  History:  Procedure Laterality Date   ESOPHAGOGASTRODUODENOSCOPY (EGD) WITH PROPOFOL N/A 08/10/2020   Procedure: ESOPHAGOGASTRODUODENOSCOPY (EGD) WITH PROPOFOL;  Surgeon: Wyline Mood, MD;  Location: [facility] ENDOSCOPY;  Service: Gastroenterology;  Laterality: N/A;   FINGER SURGERY      HERNIA REPAIR     Inguinal Hernia Repair   VIDEO ASSISTED THORACOSCOPY (VATS)/THOROCOTOMY Right 08/20/2015   Procedure: PREOP BRONCH, THORACOTOMY, RIGHT UPPER LOBECTOMY;  Surgeon: Hulda Marin, MD;  Location: [facility] ORS;  Service: General;  Laterality: Right;    Social History   Tobacco Use   Smoking status: Former    Current packs/day: 0.00    Average packs/day: 2.0 packs/day for 30.0 years (60.0 ttl pk-yrs)    Types: Cigarettes    Start date: 07/06/1985    Quit date: 08/23/2015    Years since quitting: 7.5   Smokeless tobacco: Never  Vaping Use   Vaping status: Never Used  Substance Use Topics   Alcohol use: Yes    Alcohol/week: 12.0 standard drinks of alcohol    Types: 12 Cans of beer per week    Comment: 1 Case of Beer / Weekly   Drug use: Never    Family History  Problem Relation Age of Onset   Hypertension Mother    Arthritis Mother    COPD Mother    Stroke Mother    Hypertension Father    Bladder Cancer Father    Asthma Father    Cancer Paternal Aunt        stomach   Arthritis Other        Parent   Hyperlipidemia Other        Parent   Heart disease Other        Parent   Hypertension Other        Parent   Kidney disease Other        Parent        02/24/2023    8:02 AM 12/01/2022    2:02 PM 06/06/2022    2:50 PM 01/27/2022    1:32 PM  GAD 7 : Generalized Anxiety Score  Nervous, Anxious, on Edge 0 0 0 0  Control/stop worrying 0 0 0 0  Worry too much - different things 0 0 0 0  Trouble relaxing 0 0 0 0  Restless 0 0 0 0  Easily annoyed or irritable 0 0 0 0  Afraid - awful might happen 0 0 0 0  Total GAD 7 Score 0 0 0 0  Anxiety Difficulty Not difficult at all Not difficult at all Not difficult at all Not difficult at all       02/24/2023    8:02 AM 12/01/2022    2:01 PM 06/06/2022    2:50 PM  Depression screen PHQ 2/9  Decreased Interest 0 0 0  Down, Depressed, Hopeless 0 0 0  PHQ - 2 Score 0 0 0  Altered sleeping  0 0  Tired, decreased energy  0  0  Change in appetite  0 0  Feeling bad or failure about yourself   0 0  Trouble concentrating  0 0  Moving slowly or fidgety/restless  0 0  Suicidal thoughts  0 0  PHQ-9 Score  0 0  Difficult doing work/chores  Not difficult at all Not difficult at all    BP Readings from Last 3 Encounters:  02/24/23 132/86  02/19/23 118/78  12/01/22 110/84    Wt Readings from Last 3 Encounters:  02/24/23 215 lb (97.5 kg)  02/19/23 216 lb 6.4 oz (98.2 kg)  12/01/22 212 lb (96.2 kg)    BP 132/86 (BP Location: Left Arm, Cuff Size: Large)   Pulse 79   Temp 98.1 F (36.7 C)   Ht 5\' 9"  (1.753 m)   Wt 215 lb (97.5 kg)   SpO2 95%   BMI 31.75 kg/m   Physical Exam Vitals and nursing note reviewed.  Constitutional:      Appearance: Normal appearance.  HENT:     Ears:     Comments: EAC clear bilaterally with good view of TM which is without effusion or erythema.     Nose: Nose normal.     Mouth/Throat:     Mouth: Mucous membranes are moist. No oral lesions.     Dentition: Abnormal dentition. Dental caries present.     Pharynx: No posterior oropharyngeal erythema.     Comments: Several missing teeth, the rest are stained, at least one is very loose, multiple caries, plaque evident.  Eyes:     Extraocular Movements: Extraocular movements intact.     Conjunctiva/sclera: Conjunctivae normal.     Pupils: Pupils are equal, round, and reactive to light.  Neck:     Thyroid: No thyromegaly.  Cardiovascular:     Rate and Rhythm: Normal rate and regular rhythm.     Heart sounds: No murmur heard.    No friction rub. No gallop.     Comments: Pulses 2+ at radial, PT, DP bilaterally. No carotid bruit. No peripheral edema Pulmonary:     Effort: Pulmonary effort is normal.     Breath sounds: Normal breath sounds.  Abdominal:     General: Bowel sounds are normal.     Palpations: Abdomen is soft. There is no mass.     Tenderness: There is no abdominal tenderness.  Genitourinary:    Prostate:  Normal. Not enlarged, not tender and no nodules present.     Rectum: Normal. Guaiac result negative. No mass.     Comments: Genital exam deferred. There is an erythematous, somewhat raw appearing rash of the upper gluteal cleft which patient states itches sometimes. Musculoskeletal:     Comments: Full ROM with strength 5/5 bilateral upper and lower extremities  Lymphadenopathy:     Cervical: No cervical adenopathy.  Skin:    General: Skin is warm.     Capillary Refill: Capillary refill takes less than 2 seconds.     Findings: No lesion or rash.  Neurological:     Mental Status: He is alert and oriented to person, place, and time.     Gait: Gait is intact.  Psychiatric:        Mood and Affect: Mood normal.        Behavior: Behavior normal.     Recent Labs     Component Value Date/Time   NA 140 01/27/2022 1401   K 3.9 01/27/2022 1401   CL 103 01/27/2022 1401   CO2 26 01/27/2022 1401   GLUCOSE 95 01/27/2022 1401   BUN 13 01/27/2022 1401   CREATININE 0.70 01/27/2022 1401   CALCIUM 9.5 01/27/2022 1401   PROT 7.0 01/27/2022 1401   ALBUMIN 4.6 01/27/2022 1401   AST 20 01/27/2022 1401   ALT 36 01/27/2022 1401   ALKPHOS 51 01/27/2022 1401   BILITOT 0.5 01/27/2022 1401   GFRNONAA >60 08/20/2015 1539   GFRAA >60 08/20/2015 1539    Lab Results  Component Value Date   WBC 16.2 (H) 08/20/2015   HGB 16.0 08/20/2015  HCT 46.7 08/20/2015   MCV 90.5 08/20/2015   PLT 197 08/20/2015   Lab Results  Component Value Date   HGBA1C 5.9 01/27/2022   Lab Results  Component Value Date   CHOL 109 01/27/2022   HDL 38.20 (L) 01/27/2022   LDLCALC 48 01/27/2022   LDLDIRECT 56.0 02/17/2018   TRIG 113.0 01/27/2022   CHOLHDL 3 01/27/2022   No results found for: "TSH"   Assessment and Plan:  1. Annual physical exam (Primary) Mostly healthy patient with minor abnormalities on exam. Encouraged healthy lifestyle including regular physical activity and consumption of whole fruits and  vegetables. Encouraged routine dental and eye exams, especially dental. Encouraged to brush twice daily.  Check routine labs as below.  - CBC with Differential/Platelet - Comprehensive metabolic panel - TSH - Hemoglobin A1c - Lipid panel - PSA  2. Primary hypertension Slightly elevated in clinic, but well-controlled at home.  No pharmacotherapy for this problem at this time.  Likely to improve with lifestyle changes.  3. Prediabetes Repeat A1c  4. Fatty liver Check LFTs today, encouraged weight loss.  5. Hyperlipidemia, unspecified hyperlipidemia type Check fasting lipids  6. Obesity (BMI 30.0-34.9) Patient aware he has room for improvement in nutrition and physical activity.  Discussed the impact of alcohol on weight.  7. Rash of gluteal cleft Encouraged to trial OTC topical antifungal such as clotrimazole.  8. Immunization declined Declines recommended vaccines of Prevnar 20, Shingrix, Tdap.  Encouraged to reconsider.  He will think about it.  Information given.   Return in about 6 months (around 08/24/2023) for OV f/u chronic conditions.    Alvester Morin, PA-C, DMSc, Nutritionist [facility] [phone]

## 2023-02-24 NOTE — Patient Instructions (Signed)

## 2023-02-25 LAB — CBC WITH DIFFERENTIAL/PLATELET
Basophils Absolute: 0.1 10*3/uL (ref 0.0–0.2)
Basos: 1 %
EOS (ABSOLUTE): 0.3 10*3/uL (ref 0.0–0.4)
Eos: 4 %
Hematocrit: 48.8 % (ref 37.5–51.0)
Hemoglobin: 16.6 g/dL (ref 13.0–17.7)
Immature Grans (Abs): 0 10*3/uL (ref 0.0–0.1)
Immature Granulocytes: 0 %
Lymphocytes Absolute: 2.2 10*3/uL (ref 0.7–3.1)
Lymphs: 35 %
MCH: 30.5 pg (ref 26.6–33.0)
MCHC: 34 g/dL (ref 31.5–35.7)
MCV: 90 fL (ref 79–97)
Monocytes Absolute: 0.6 10*3/uL (ref 0.1–0.9)
Monocytes: 10 %
Neutrophils Absolute: 3.1 10*3/uL (ref 1.4–7.0)
Neutrophils: 50 %
Platelets: 216 10*3/uL (ref 150–450)
RBC: 5.45 x10E6/uL (ref 4.14–5.80)
RDW: 12.8 % (ref 11.6–15.4)
WBC: 6.3 10*3/uL (ref 3.4–10.8)

## 2023-02-25 LAB — COMPREHENSIVE METABOLIC PANEL
ALT: 21 [IU]/L (ref 0–44)
AST: 17 [IU]/L (ref 0–40)
Albumin: 4.6 g/dL (ref 3.8–4.9)
Alkaline Phosphatase: 73 [IU]/L (ref 44–121)
BUN/Creatinine Ratio: 25 — ABNORMAL HIGH (ref 9–20)
BUN: 28 mg/dL — ABNORMAL HIGH (ref 6–24)
Bilirubin Total: 0.3 mg/dL (ref 0.0–1.2)
CO2: 25 mmol/L (ref 20–29)
Calcium: 10.6 mg/dL — ABNORMAL HIGH (ref 8.7–10.2)
Chloride: 99 mmol/L (ref 96–106)
Creatinine, Ser: 1.11 mg/dL (ref 0.76–1.27)
Globulin, Total: 2.3 g/dL (ref 1.5–4.5)
Glucose: 92 mg/dL (ref 70–99)
Potassium: 3.9 mmol/L (ref 3.5–5.2)
Sodium: 141 mmol/L (ref 134–144)
Total Protein: 6.9 g/dL (ref 6.0–8.5)
eGFR: 79 mL/min/{1.73_m2} (ref >59)

## 2023-02-25 LAB — HEMOGLOBIN A1C
Est. average glucose Bld gHb Est-mCnc: 117 mg/dL
Hgb A1c MFr Bld: 5.7 % — ABNORMAL HIGH (ref 4.8–5.6)

## 2023-02-25 LAB — LIPID PANEL
Chol/HDL Ratio: 3.3 {ratio} (ref 0.0–5.0)
Cholesterol, Total: 245 mg/dL — ABNORMAL HIGH (ref 100–199)
HDL: 75 mg/dL (ref >39)
LDL Chol Calc (NIH): 131 mg/dL — ABNORMAL HIGH (ref 0–99)
Triglycerides: 221 mg/dL — ABNORMAL HIGH (ref 0–149)
VLDL Cholesterol Cal: 39 mg/dL (ref 5–40)

## 2023-02-25 LAB — PSA: Prostate Specific Ag, Serum: <0.1 ng/mL (ref 0.0–4.0)

## 2023-02-25 LAB — TSH: TSH: 2.23 u[IU]/mL (ref 0.450–4.500)

## 2023-03-20 MED FILL — Sildenafil Citrate Tab 100 MG: ORAL | 10 days supply | Qty: 10 | Fill #3 | Status: AC

## 2023-03-20 MED FILL — Omeprazole Cap Delayed Release 20 MG: ORAL | 90 days supply | Qty: 90 | Fill #1 | Status: AC

## 2023-03-22 ENCOUNTER — Other Ambulatory Visit: Payer: Self-pay

## 2023-03-25 ENCOUNTER — Other Ambulatory Visit: Payer: Self-pay

## 2023-03-25 ENCOUNTER — Other Ambulatory Visit: Payer: Self-pay | Admitting: Internal Medicine

## 2023-03-25 MED ORDER — ANORO ELLIPTA 62.5-25 MCG/ACT IN AEPB
1.0000 | INHALATION_SPRAY | Freq: Every day | RESPIRATORY_TRACT | 1 refills | Status: DC
  Filled 2023-03-25: qty 60, 30d supply, fill #0
  Filled 2023-04-19: qty 60, 30d supply, fill #1

## 2023-04-15 ENCOUNTER — Other Ambulatory Visit: Payer: Self-pay

## 2023-04-19 ENCOUNTER — Other Ambulatory Visit: Payer: Self-pay

## 2023-04-19 MED FILL — Sildenafil Citrate Tab 100 MG: ORAL | 10 days supply | Qty: 10 | Fill #4 | Status: AC

## 2023-04-22 ENCOUNTER — Ambulatory Visit: Admitting: Physician Assistant

## 2023-04-22 ENCOUNTER — Encounter: Payer: Self-pay | Admitting: Physician Assistant

## 2023-04-22 ENCOUNTER — Other Ambulatory Visit: Payer: Self-pay

## 2023-04-22 VITALS — BP 142/94 | HR 78 | Temp 97.8°F | Ht 69.0 in | Wt 206.0 lb

## 2023-04-22 DIAGNOSIS — J441 Chronic obstructive pulmonary disease with (acute) exacerbation: Secondary | ICD-10-CM

## 2023-04-22 MED ORDER — DOXYCYCLINE HYCLATE 100 MG PO TABS
100.0000 mg | ORAL_TABLET | Freq: Two times a day (BID) | ORAL | 0 refills | Status: AC
  Filled 2023-04-22: qty 10, 5d supply, fill #0

## 2023-04-22 MED ORDER — PREDNISONE 20 MG PO TABS
20.0000 mg | ORAL_TABLET | Freq: Two times a day (BID) | ORAL | 0 refills | Status: DC
  Filled 2023-04-22: qty 10, 5d supply, fill #0

## 2023-04-22 MED ORDER — GUAIFENESIN-CODEINE 100-10 MG/5ML PO SOLN
5.0000 mL | Freq: Three times a day (TID) | ORAL | 0 refills | Status: DC | PRN
  Filled 2023-04-22: qty 120, 8d supply, fill #0

## 2023-04-22 NOTE — Progress Notes (Signed)
 Date:  04/22/2023   Name:  Jimmy Howell   DOB:  [birth year]   MRN:  [id number]   Chief Complaint: Cough (Feels congested in chest )  Cough This is a new problem. Episode onset: X6 days. The problem has been gradually worsening. The problem occurs every few minutes (when laying down). The cough is Productive of sputum (green mucous). Associated symptoms include a fever, rhinorrhea and wheezing. He has tried OTC cough suppressant for the symptoms. The treatment provided mild relief. His past medical history is significant for COPD.   Patient complains of 6 days URI symptoms as described above with a persistent productive cough. 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 Has not received pneumonia shot. Coughing spells are forceful and cause burning pain and soreness in the central chest. Mucinex DM not helping much.    Medication list has been reviewed and updated.  Current Meds  Medication Sig   albuterol (VENTOLIN HFA) 108 (90 Base) MCG/ACT inhaler Inhale 2 puffs into the lungs every 4 (four) hours as needed for wheezing or shortness of breath.   doxycycline (VIBRA-TABS) 100 MG tablet Take 1 tablet (100 mg total) by mouth 2 (two) times daily for 5 days. Do not take with dairy. This medication INCREASES SUN SENSITIVITY so avoid direct sunlight.   guaiFENesin-codeine 100-10 MG/5ML syrup Take 5 mLs by mouth 3 (three) times daily as needed for cough.   meloxicam (MOBIC) 15 MG tablet Take 1 tablet (15 mg total) by mouth daily as needed for pain. Take with food. Do not take with any other NSAIDs (ibuprofen, naproxen, aspirin). Tylenol is okay.   methocarbamol (ROBAXIN) 500 MG tablet Take 1 tablet (500 mg total) by mouth every 8 (eight) hours as needed for muscle spasms.   olopatadine (PATADAY) 0.1 % ophthalmic solution Place 1 drop into both eyes 2 (two) times daily.   omeprazole (PRILOSEC) 20 MG capsule Take 1 capsule (20 mg total) by mouth daily.   predniSONE (DELTASONE) 20 MG  tablet Take 1 tablet (20 mg total) by mouth in the morning and at bedtime.   rosuvastatin (CRESTOR) 20 MG tablet Take 1 tablet (20 mg total) by mouth daily.   sildenafil (VIAGRA) 100 MG tablet Take 0.5-1 tablets (50-100 mg total) by mouth daily as needed for erectile dysfunction.   umeclidinium-vilanterol (ANORO ELLIPTA) 62.5-25 MCG/ACT AEPB Inhale 1 puff into the lungs daily.   Current Facility-Administered Medications for the 04/22/23 encounter (Office Visit) with Remo Lipps, PA  Medication   ipratropium-albuterol (DUONEB) 0.5-2.5 (3) MG/3ML nebulizer solution 3 mL     Review of Systems  Constitutional:  Positive for fever.  HENT:  Positive for rhinorrhea.   Respiratory:  Positive for cough and wheezing.     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   Hyperlipidemia 04/19/2018   Obesity (BMI 30.0-34.9) 06/17/2017   Fatty liver 06/17/2017   Alcohol abuse 10/23/2015   Atherosclerosis of native coronary artery of native heart without angina pectoris 08/03/2015   Acid reflux 07/09/2015   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   Lung nodules 02/19/2015   COPD (chronic obstructive pulmonary disease) (HCC) 02/19/2015   Liver lesion 02/19/2015  Degeneration of intervertebral disc of cervical region 11/21/2013   Cervical spinal stenosis 11/21/2013    No Known Allergies  Immunization History  Administered Date(s) Administered   Hep A / Hep B 10/06/2017, 11/09/2017, 02/23/2020   Influenza,inj,Quad PF,6+ Mos 10/27/2017, 11/08/2018, 12/08/2019   Moderna Sars-Covid-2 Vaccination 09/02/2019, 10/02/2019    Past Surgical  History:  Procedure Laterality Date   ESOPHAGOGASTRODUODENOSCOPY (EGD) WITH PROPOFOL N/A 08/10/2020   Procedure: ESOPHAGOGASTRODUODENOSCOPY (EGD) WITH PROPOFOL;  Surgeon: Wyline Mood, MD;  Location: [facility] END[facility];  Service: Gastroenterology;  Laterality: N/A;   FINGER SURGERY     HERNIA REPAIR     Inguinal Hernia Repair   VIDEO ASSISTED THORACOSCOPY (VATS)/THOROCOTOMY Right 08/20/2015   Procedure: PREOP BRONCH, THORACOTOMY, RIGHT UPPER LOBECTOMY;  Surgeon: Hulda Marin, MD;  Location: [facility] ORS;  Service: General;  Laterality: Right;    Social History   Tobacco Use   Smoking status: Former    Current packs/day: 0.00    Average packs/day: 2.0 packs/day for 30.0 years (60.0 ttl pk-yrs)    Types: Cigarettes    Start date: 07/06/1985    Quit date: 08/23/2015    Years since quitting: 7.6   Smokeless tobacco: Never  Vaping Use   Vaping status: Never Used  Substance Use Topics   Alcohol use: Yes    Alcohol/week: 12.0 standard drinks of alcohol    Types: 12 Cans of beer per week    Comment: 1 Case of Beer / Weekly   Drug use: Never    Family History  Problem Relation Age of Onset   Hypertension Mother    Arthritis Mother    COPD Mother    Stroke Mother    Hypertension Father    Bladder Cancer Father    Asthma Father    Cancer Paternal Aunt        stomach   Arthritis Other        Parent   Hyperlipidemia Other        Parent   Heart disease Other        Parent   Hypertension Other        Parent   Kidney disease Other        Parent        04/22/2023    3:47 PM 02/24/2023    8:02 AM 12/01/2022    2:02 PM 06/06/2022    2:50 PM  GAD 7 : Generalized Anxiety Score  Nervous, Anxious, on Edge 0 0 0 0  Control/stop worrying 0 0 0 0  Worry too much - different things 0 0 0 0  Trouble relaxing 0 0 0 0  Restless 0 0 0 0  Easily annoyed or irritable 0 0 0 0  Afraid - awful might happen 0 0 0 0  Total GAD 7 Score 0 0 0 0  Anxiety Difficulty Not difficult at all Not difficult at  all Not difficult at all Not difficult at all       04/22/2023    3:47 PM 02/24/2023    8:02 AM 12/01/2022    2:01 PM  Depression screen PHQ 2/9  Decreased Interest 0 0 0  Down, Depressed, Hopeless 0 0 0  PHQ - 2 Score 0 0 0  Altered sleeping   0  Tired, decreased energy   0  Change in appetite   0  Feeling bad or failure about yourself    0  Trouble concentrating   0  Moving slowly or fidgety/restless   0  Suicidal thoughts   0  PHQ-9 Score   0  Difficult doing work/chores   Not difficult at all    BP Readings from Last 3 Encounters:  04/22/23 (!) 142/94  02/24/23 132/86  02/19/23 118/78    Wt Readings from Last 3 Encounters:  04/22/23 206 lb (93.4 kg)  02/24/23 215 lb (97.5 kg)  02/19/23 216 lb 6.4 oz (98.2 kg)    BP (!) 142/94   Pulse 78   Temp 97.8 F (36.6 C)   Ht 5\' 9"  (1.753 m)   Wt 206 lb (93.4 kg)   SpO2 96%   BMI 30.42 kg/m   Physical Exam Vitals and nursing note reviewed.  Constitutional:      General: He is not in acute distress.    Appearance: Normal appearance.  HENT:     Right Ear: Tympanic membrane normal.     Left Ear: Tympanic membrane normal.     Ears:     Comments: EAC clear bilaterally with good view of TM which is without effusion or erythema.     Nose: Nose normal.     Comments: Sinuses nontender    Mouth/Throat:     Mouth: Mucous membranes are moist.     Pharynx: No oropharyngeal exudate or posterior oropharyngeal erythema.  Eyes:     Conjunctiva/sclera: Conjunctivae normal.     Pupils: Pupils are equal, round, and reactive to light.  Cardiovascular:     Rate and Rhythm: Normal rate and regular rhythm.     Heart sounds: No murmur heard.    No friction rub. No gallop.  Pulmonary:     Effort: Pulmonary effort is normal.     Breath sounds: Wheezing and rhonchi present. No rales.  Lymphadenopathy:     Cervical: No cervical adenopathy.     Recent Labs     Component Value Date/Time   NA 141 02/24/2023 0844   K 3.9  02/24/2023 0844   CL 99 02/24/2023 0844   CO2 25 02/24/2023 0844   GLUCOSE 92 02/24/2023 0844   GLUCOSE 95 01/27/2022 1401   BUN 28 (H) 02/24/2023 0844   CREATININE 1.11 02/24/2023 0844   CALCIUM 10.6 (H) 02/24/2023 0844   PROT 6.9 02/24/2023 0844   ALBUMIN 4.6 02/24/2023 0844   AST 17 02/24/2023 0844   ALT 21 02/24/2023 0844   ALKPHOS 73 02/24/2023 0844   BILITOT 0.3 02/24/2023 0844   GFRNONAA >60 08/20/2015 1539   GFRAA >60 08/20/2015 1539    Lab Results  Component Value Date   WBC 6.3 02/24/2023   HGB 16.6 02/24/2023   HCT 48.8 02/24/2023   MCV 90 02/24/2023   PLT 216 02/24/2023   Lab Results  Component Value Date   HGBA1C 5.7 (H) 02/24/2023   Lab Results  Component Value Date   CHOL 245 (H) 02/24/2023   HDL 75 02/24/2023   LDLCALC 131 (H) 02/24/2023   LDLDIRECT 56.0 02/17/2018   TRIG 221 (H) 02/24/2023   CHOLHDL 3.3 02/24/2023   Lab Results  Component Value Date   TSH 2.230 02/24/2023     Assessment and Plan:  1. COPD exacerbation (HCC) (Primary) Treat as below. Also advised conservative measures including rest, fluids, honey, and OTC cough/cold medications.   Contact precautions advised to limit spread. Encouraged mask wearing and good hand hygiene especially before meals.   - doxycycline (VIBRA-TABS) 100 MG tablet; Take 1 tablet (100 mg total) by mouth 2 (two) times daily for 5 days. Do not take with  dairy. This medication INCREASES SUN SENSITIVITY so avoid direct sunlight.  Dispense: 10 tablet; Refill: 0 - predniSONE (DELTASONE) 20 MG tablet; Take 1 tablet (20 mg total) by mouth in the morning and at bedtime.  Dispense: 10 tablet; Refill: 0 - guaiFENesin-codeine 100-10 MG/5ML syrup; Take 5 mLs by mouth 3 (three) times daily as needed for cough.  Dispense: 120 mL; Refill: 0   Encouraged patient to complete Prevnar 20 immunization once he recovers. Early May would be a good time to do this, as he is set to go on a cruise 05/18/23.    Cody Das, PA-C,  DMSc, Nutritionist [facility] Primary Care and Sports Medicine [facility] [phone]

## 2023-05-05 ENCOUNTER — Other Ambulatory Visit: Payer: Self-pay | Admitting: Physician Assistant

## 2023-05-05 DIAGNOSIS — Z1211 Encounter for screening for malignant neoplasm of colon: Secondary | ICD-10-CM

## 2023-05-23 ENCOUNTER — Other Ambulatory Visit: Payer: Self-pay | Admitting: Internal Medicine

## 2023-05-23 MED ORDER — UMECLIDINIUM-VILANTEROL 62.5-25 MCG/ACT IN AEPB
1.0000 | INHALATION_SPRAY | Freq: Every day | RESPIRATORY_TRACT | 1 refills | Status: DC
  Filled 2023-05-23: qty 60, 60d supply, fill #0
  Filled 2023-06-21: qty 60, 30d supply, fill #1

## 2023-05-23 MED FILL — Sildenafil Citrate Tab 100 MG: ORAL | 10 days supply | Qty: 10 | Fill #5 | Status: AC

## 2023-05-24 ENCOUNTER — Other Ambulatory Visit: Payer: Self-pay

## 2023-06-05 ENCOUNTER — Other Ambulatory Visit: Payer: Self-pay

## 2023-06-05 ENCOUNTER — Other Ambulatory Visit: Payer: Self-pay | Admitting: Internal Medicine

## 2023-06-08 ENCOUNTER — Other Ambulatory Visit: Payer: Self-pay

## 2023-06-12 ENCOUNTER — Other Ambulatory Visit: Payer: Self-pay | Admitting: Physician Assistant

## 2023-06-12 ENCOUNTER — Other Ambulatory Visit: Payer: Self-pay

## 2023-06-15 ENCOUNTER — Other Ambulatory Visit: Payer: Self-pay

## 2023-06-15 MED FILL — Omeprazole Cap Delayed Release 20 MG: ORAL | 90 days supply | Qty: 90 | Fill #0 | Status: AC

## 2023-06-15 NOTE — Telephone Encounter (Signed)
 Requested Prescriptions  Pending Prescriptions Disp Refills   omeprazole  (PRILOSEC) 20 MG capsule 90 capsule 0    Sig: Take 1 capsule (20 mg total) by mouth daily.     Gastroenterology: Proton Pump Inhibitors Passed - 06/15/2023  9:32 AM      Passed - Valid encounter within last 12 months    Recent Outpatient Visits           1 month ago COPD exacerbation [facility])   [place] [facility] Care & Sports Medicine at [facility], Arleen Lacer, PA   3 months ago Annual physical exam   [facility] Medicine at [facility], Arleen Lacer, [place]       Future Appointments             In 1 month Larkin Plumb, Arleen Lacer, [place] [facility] Primary Care & Sports Medicine at [facility], [facility]

## 2023-06-16 LAB — COLOGUARD: COLOGUARD: NEGATIVE

## 2023-06-17 ENCOUNTER — Ambulatory Visit: Payer: Self-pay | Admitting: Physician Assistant

## 2023-06-21 ENCOUNTER — Other Ambulatory Visit: Payer: Self-pay

## 2023-06-21 ENCOUNTER — Other Ambulatory Visit: Payer: Self-pay | Admitting: Physician Assistant

## 2023-06-21 DIAGNOSIS — N529 Male erectile dysfunction, unspecified: Secondary | ICD-10-CM

## 2023-06-22 ENCOUNTER — Other Ambulatory Visit: Payer: Self-pay

## 2023-06-22 ENCOUNTER — Other Ambulatory Visit: Payer: Self-pay | Admitting: Physician Assistant

## 2023-06-22 DIAGNOSIS — N529 Male erectile dysfunction, unspecified: Secondary | ICD-10-CM

## 2023-06-23 ENCOUNTER — Other Ambulatory Visit: Payer: Self-pay

## 2023-06-24 ENCOUNTER — Other Ambulatory Visit: Payer: Self-pay

## 2023-06-24 MED FILL — Sildenafil Citrate Tab 100 MG: ORAL | 10 days supply | Qty: 10 | Fill #0 | Status: AC

## 2023-06-24 NOTE — Telephone Encounter (Signed)
 Requested Prescriptions  Pending Prescriptions Disp Refills   sildenafil  (VIAGRA ) 100 MG tablet 10 tablet 5    Sig: Take 0.5-1 tablets (50-100 mg total) by mouth daily as needed for erectile dysfunction.     Urology: Erectile Dysfunction Agents Failed - 06/24/2023  1:54 PM      Failed - Last BP in normal range    BP Readings from Last 1 Encounters:  04/22/23 (!) 142/94         Passed - AST in normal range and within 360 days    AST  Date Value Ref Range Status  02/24/2023 17 0 - 40 IU/L Final         Passed - ALT in normal range and within 360 days    ALT  Date Value Ref Range Status  02/24/2023 21 0 - 44 IU/L Final         Passed - Valid encounter within last 12 months    Recent Outpatient Visits           2 months ago COPD exacerbation (HCC)   [place] Primary Care & Sports Medicine at [facility], Arleen Lacer, PA   4 months ago Annual physical exam   [facility] Primary Care & Sports Medicine at [facility], Arleen Lacer, [place]       Future Appointments             In 1 month Larkin Plumb, Arleen Lacer, [place] [facility] Primary Care & Sports Medicine at [facility], [facility]

## 2023-06-25 ENCOUNTER — Other Ambulatory Visit: Payer: Self-pay

## 2023-07-07 ENCOUNTER — Other Ambulatory Visit: Payer: Self-pay

## 2023-07-07 ENCOUNTER — Other Ambulatory Visit: Payer: Self-pay | Admitting: Physician Assistant

## 2023-07-07 DIAGNOSIS — E785 Hyperlipidemia, unspecified: Secondary | ICD-10-CM

## 2023-07-08 ENCOUNTER — Other Ambulatory Visit: Payer: Self-pay

## 2023-07-09 ENCOUNTER — Other Ambulatory Visit: Payer: Self-pay

## 2023-07-09 MED FILL — Rosuvastatin Calcium Tab 20 MG: ORAL | 90 days supply | Qty: 90 | Fill #0 | Status: AC

## 2023-07-09 NOTE — Telephone Encounter (Signed)
 Requested Prescriptions  Pending Prescriptions Disp Refills   rosuvastatin  (CRESTOR ) 20 MG tablet 90 tablet 1    Sig: Take 1 tablet (20 mg total) by mouth daily.     Cardiovascular:  Antilipid - Statins 2 Failed - 07/09/2023 10:00 AM      Failed - Lipid Panel in normal range within the last 12 months    Cholesterol, Total  Date Value Ref Range Status  02/24/2023 245 (H) 100 - 199 mg/dL Final   LDL Chol Calc (NIH)  Date Value Ref Range Status  02/24/2023 131 (H) 0 - 99 mg/dL Final   Direct LDL  Date Value Ref Range Status  02/17/2018 56.0 mg/dL Final    Comment:    Optimal:  <100 mg/dLNear or Above Optimal:  100-129 mg/dLBorderline High:  130-159 mg/dLHigh:  160-189 mg/dLVery High:  >190 mg/dL   HDL  Date Value Ref Range Status  02/24/2023 75 >39 mg/dL Final   Triglycerides  Date Value Ref Range Status  02/24/2023 221 (H) 0 - 149 mg/dL Final         Passed - Cr in normal range and within 360 days    Creatinine, Ser  Date Value Ref Range Status  02/24/2023 1.11 0.76 - 1.27 mg/dL Final         Passed - Patient is not pregnant      Passed - Valid encounter within last 12 months    Recent Outpatient Visits           2 months ago COPD exacerbation (HCC)   [place] [facility] at [facility], Toribio SQUIBB, [place]   4 months ago Annual physical exam   [facility] Primary Care & Sports Medicine at [facility], Toribio SQUIBB, [place]       Future Appointments             In 1 month Many[place], Toribio SQUIBB, [place] [facility] Primary Care & Sports Medicine at [facility], [facility]

## 2023-07-22 ENCOUNTER — Other Ambulatory Visit: Payer: Self-pay

## 2023-07-22 ENCOUNTER — Telehealth: Payer: Self-pay

## 2023-07-22 ENCOUNTER — Other Ambulatory Visit: Payer: Self-pay | Admitting: Internal Medicine

## 2023-07-22 MED ORDER — UMECLIDINIUM-VILANTEROL 62.5-25 MCG/ACT IN AEPB
1.0000 | INHALATION_SPRAY | Freq: Every day | RESPIRATORY_TRACT | 1 refills | Status: DC
  Filled 2023-07-22: qty 60, 30d supply, fill #0
  Filled 2023-08-24: qty 60, 30d supply, fill #1

## 2023-07-22 MED FILL — Sildenafil Citrate Tab 100 MG: ORAL | 10 days supply | Qty: 10 | Fill #1 | Status: AC

## 2023-07-22 NOTE — Telephone Encounter (Signed)
 LMTCB to schedule ROV in August. E2C2 please schedule appt for patient when he calls back. Refill sent to pharmacy.

## 2023-08-12 ENCOUNTER — Ambulatory Visit: Payer: 59 | Admitting: Physician Assistant

## 2023-08-24 ENCOUNTER — Other Ambulatory Visit: Payer: Self-pay

## 2023-08-24 MED FILL — Sildenafil Citrate Tab 100 MG: ORAL | 10 days supply | Qty: 10 | Fill #2 | Status: AC

## 2023-09-04 ENCOUNTER — Other Ambulatory Visit: Payer: Self-pay

## 2023-09-04 ENCOUNTER — Other Ambulatory Visit: Payer: Self-pay | Admitting: Physician Assistant

## 2023-09-06 MED ORDER — OMEPRAZOLE 20 MG PO CPDR
20.0000 mg | DELAYED_RELEASE_CAPSULE | Freq: Every day | ORAL | 0 refills | Status: DC
Start: 2023-09-06 — End: 2023-11-07
  Filled 2023-09-06: qty 90, 90d supply, fill #0

## 2023-09-06 NOTE — Telephone Encounter (Signed)
 Requested Prescriptions  Pending Prescriptions Disp Refills   omeprazole  (PRILOSEC) 20 MG capsule 90 capsule 0    Sig: Take 1 capsule (20 mg total) by mouth daily.     Gastroenterology: Proton Pump Inhibitors Passed - 09/06/2023 10:36 AM      Passed - Valid encounter within last 12 months    Recent Outpatient Visits           4 months ago COPD exacerbation [facility])   [place] Primary Care & Sports Medicine at [facility], Toribio SQUIBB, [place]   6 months ago Annual physical exam   [facility] Primary Care & Sports Medicine at [facility] Toribio SQUIBB, [place]       Future Appointments             In 1 month Many[place], Toribio SQUIBB, [place] [facility] Primary Care & Sports Medicine at [facility], [phone] [place]

## 2023-09-07 ENCOUNTER — Other Ambulatory Visit: Payer: Self-pay

## 2023-09-15 ENCOUNTER — Other Ambulatory Visit: Payer: Self-pay | Admitting: Internal Medicine

## 2023-09-16 ENCOUNTER — Other Ambulatory Visit: Payer: Self-pay

## 2023-09-16 MED ORDER — UMECLIDINIUM-VILANTEROL 62.5-25 MCG/ACT IN AEPB
1.0000 | INHALATION_SPRAY | Freq: Every day | RESPIRATORY_TRACT | 0 refills | Status: DC
  Filled 2023-09-16: qty 60, 30d supply, fill #0

## 2023-09-27 ENCOUNTER — Other Ambulatory Visit: Payer: Self-pay | Admitting: Physician Assistant

## 2023-09-27 DIAGNOSIS — N529 Male erectile dysfunction, unspecified: Secondary | ICD-10-CM

## 2023-09-28 ENCOUNTER — Other Ambulatory Visit: Payer: Self-pay | Admitting: Physician Assistant

## 2023-09-28 ENCOUNTER — Other Ambulatory Visit: Payer: Self-pay

## 2023-09-28 DIAGNOSIS — N529 Male erectile dysfunction, unspecified: Secondary | ICD-10-CM

## 2023-09-29 ENCOUNTER — Other Ambulatory Visit: Payer: Self-pay

## 2023-09-29 MED FILL — Sildenafil Citrate Tab 100 MG: ORAL | 90 days supply | Qty: 90 | Fill #0 | Status: AC

## 2023-09-29 NOTE — Telephone Encounter (Signed)
 Requested Prescriptions  Pending Prescriptions Disp Refills   sildenafil  (VIAGRA ) 100 MG tablet 10 tablet 2    Sig: Take 0.5-1 tablets (50-100 mg total) by mouth daily as needed for erectile dysfunction.     Urology: Erectile Dysfunction Agents Failed - 09/29/2023 12:07 PM      Failed - Last BP in normal range    BP Readings from Last 1 Encounters:  04/22/23 (!) 142/94         Passed - AST in normal range and within 360 days    AST  Date Value Ref Range Status  02/24/2023 17 0 - 40 IU/L Final         Passed - ALT in normal range and within 360 days    ALT  Date Value Ref Range Status  02/24/2023 21 0 - 44 IU/L Final         Passed - Valid encounter within last 12 months    Recent Outpatient Visits           5 months ago COPD exacerbation (HCC)   [place] [facility] Care & Sports Medicine at [facility], Toribio SQUIBB, [place]   7 months ago Annual physical exam   [facility] Sports Medicine at [facility], Toribio SQUIBB, [place]       Future Appointments             In 1 week Manya, Toribio SQUIBB, [place] [facility] Primary Care & Sports Medicine at [facility], [phone] [place]

## 2023-10-01 ENCOUNTER — Other Ambulatory Visit: Payer: Self-pay

## 2023-10-07 ENCOUNTER — Other Ambulatory Visit: Payer: Self-pay | Admitting: Physician Assistant

## 2023-10-07 DIAGNOSIS — E785 Hyperlipidemia, unspecified: Secondary | ICD-10-CM

## 2023-10-08 ENCOUNTER — Other Ambulatory Visit: Payer: Self-pay

## 2023-10-08 ENCOUNTER — Other Ambulatory Visit: Payer: Self-pay | Admitting: Physician Assistant

## 2023-10-08 DIAGNOSIS — E785 Hyperlipidemia, unspecified: Secondary | ICD-10-CM

## 2023-10-09 ENCOUNTER — Other Ambulatory Visit: Payer: Self-pay

## 2023-10-09 MED FILL — Rosuvastatin Calcium Tab 20 MG: ORAL | 90 days supply | Qty: 90 | Fill #0 | Status: AC

## 2023-10-09 NOTE — Telephone Encounter (Signed)
 Requested Prescriptions  Pending Prescriptions Disp Refills   rosuvastatin  (CRESTOR ) 20 MG tablet 90 tablet 0    Sig: Take 1 tablet (20 mg total) by mouth daily.     Cardiovascular:  Antilipid - Statins 2 Failed - 10/09/2023  3:02 PM      Failed - Lipid Panel in normal range within the last 12 months    Cholesterol, Total  Date Value Ref Range Status  02/24/2023 245 (H) 100 - 199 mg/dL Final   LDL Chol Calc (NIH)  Date Value Ref Range Status  02/24/2023 131 (H) 0 - 99 mg/dL Final   Direct LDL  Date Value Ref Range Status  02/17/2018 56.0 mg/dL Final    Comment:    Optimal:  <100 mg/dLNear or Above Optimal:  100-129 mg/dLBorderline High:  130-159 mg/dLHigh:  160-189 mg/dLVery High:  >190 mg/dL   HDL  Date Value Ref Range Status  02/24/2023 75 >39 mg/dL Final   Triglycerides  Date Value Ref Range Status  02/24/2023 221 (H) 0 - 149 mg/dL Final         Passed - Cr in normal range and within 360 days    Creatinine, Ser  Date Value Ref Range Status  02/24/2023 1.11 0.76 - 1.27 mg/dL Final         Passed - Patient is not pregnant      Passed - Valid encounter within last 12 months    Recent Outpatient Visits           5 months ago COPD exacerbation (HCC)   [place] Primary Care & Sports Medicine at [facility], Toribio SQUIBB, [place]   7 months ago Annual physical exam   [facility] Primary Care & Sports Medicine at [facility], Toribio SQUIBB, [place]       Future Appointments             In 3 days Many[place], Toribio SQUIBB, [place] [facility] Health Primary Care & Sports Medicine at [facility], [phone] [place]

## 2023-10-12 ENCOUNTER — Ambulatory Visit: Admitting: Physician Assistant

## 2023-10-12 ENCOUNTER — Encounter: Payer: Self-pay | Admitting: Physician Assistant

## 2023-10-12 VITALS — BP 124/74 | HR 74 | Temp 97.8°F | Ht 69.0 in | Wt 205.4 lb

## 2023-10-12 DIAGNOSIS — R7303 Prediabetes: Secondary | ICD-10-CM

## 2023-10-12 DIAGNOSIS — E785 Hyperlipidemia, unspecified: Secondary | ICD-10-CM | POA: Diagnosis not present

## 2023-10-12 DIAGNOSIS — Z23 Encounter for immunization: Secondary | ICD-10-CM | POA: Diagnosis not present

## 2023-10-12 NOTE — Progress Notes (Signed)
 Date:  10/12/2023   Name:  Jimmy Howell   DOB:  [birth year]   MRN:  [id number]   Chief Complaint: No chief complaint on file.  HPI Jimmy Howell returns for 6 month f/u visit on chronic conditions.  Lipids were not at goal last time but we did not change rosuvastatin  dosing, focusing instead of lifestyle changes. Weight is stable. Fasting for repeat labs today.   Pulmonology Dr. Hoy refills his inhalers for COPD. Patient finds himself wheezing occasionally with exertion but otherwise breathes well.   Medication list has been reviewed and updated.  Current Meds  Medication Sig   albuterol  (VENTOLIN  HFA) 108 (90 Base) MCG/ACT inhaler Inhale 2 puffs into the lungs every 4 (four) hours as needed for wheezing or shortness of breath.   meloxicam  (MOBIC ) 15 MG tablet Take 1 tablet (15 mg total) by mouth daily as needed for pain. Take with food. Do not take with any other NSAIDs (ibuprofen , naproxen, aspirin). Tylenol  is okay.   methocarbamol  (ROBAXIN ) 500 MG tablet Take 1 tablet (500 mg total) by mouth every 8 (eight) hours as needed for muscle spasms.   olopatadine  (PATADAY ) 0.1 % ophthalmic solution Place 1 drop into both eyes 2 (two) times daily.   omeprazole  (PRILOSEC) 20 MG capsule Take 1 capsule (20 mg total) by mouth daily.   rosuvastatin  (CRESTOR ) 20 MG tablet Take 1 tablet (20 mg total) by mouth daily.   sildenafil  (VIAGRA ) 100 MG tablet Take 0.5-1 tablets (50-100 mg total) by mouth daily as needed for erectile dysfunction.   umeclidinium-vilanterol (ANORO ELLIPTA ) 62.5-25 MCG/ACT AEPB Inhale 1 puff into the lungs daily.   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   Hyperlipidemia 04/19/2018   Obesity (BMI 30.0-34.9) 06/17/2017   Fatty liver 06/17/2017   Alcohol abuse 10/23/2015   Atherosclerosis of native coronary artery of native heart without angina pectoris 08/03/2015   Acid reflux 07/09/2015   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   Lung nodules 02/19/2015   COPD (chronic obstructive pulmonary disease) (HCC) 02/19/2015   Liver lesion 02/19/2015   Degeneration of intervertebral disc of cervical region 11/21/2013   Cervical spinal stenosis 11/21/2013    No Known Allergies  Immunization History  Administered Date(s) Administered   Hep A / Hep B 10/06/2017, 11/09/2017, 02/23/2020   Influenza,inj,Quad PF,6+ Mos 10/27/2017, 11/08/2018, 12/08/2019   Moderna Sars-Covid-2 Vaccination 09/02/2019, 10/02/2019    Past Surgical History:  Procedure Laterality Date   ESOPHAGOGASTRODUODENOSCOPY (EGD) WITH PROPOFOL  N/A 08/10/2020   Procedure: ESOPHAGOGASTRODUODENOSCOPY (EGD) WITH PROPOFOL ;  Surgeon: Therisa Bi, MD;  Location: [facility] ENDOSCOPY;  Service: Gastroenterology;  Laterality: N/A;   FINGER SURGERY     HERNIA REPAIR     Inguinal Hernia Repair   VIDEO ASSISTED THORACOSCOPY (VATS)/THOROCOTOMY Right 08/20/2015   Procedure: PREOP BRONCH, THORACOTOMY, RIGHT UPPER LOBECTOMY;  Surgeon: Evalene Glasser, MD;  Location: [facility] ORS;  Service: General;  Laterality: Right;    Social History   Tobacco Use   Smoking  status: Former    Current packs/day: 0.00    Average packs/day: 2.0 packs/day for 30.0 years (60.0 ttl pk-yrs)    Types: Cigarettes    Start date: 07/06/1985    Quit date: 08/23/2015    Years since quitting: 8.1   Smokeless tobacco: Never  Vaping Use   Vaping status: Never Used   Substance Use Topics   Alcohol use: Yes    Alcohol/week: 12.0 standard drinks of alcohol    Types: 12 Cans of beer per week   Drug use: Never    Family History  Problem Relation Age of Onset   Hypertension Mother    Arthritis Mother    COPD Mother    Stroke Mother    Hypertension Father    Bladder Cancer Father    Asthma Father    Cancer Paternal Aunt        stomach   Arthritis Other        Parent   Hyperlipidemia Other        Parent   Heart disease Other        Parent   Hypertension Other        Parent   Kidney disease Other        Parent        04/22/2023    3:47 PM 02/24/2023    8:02 AM 12/01/2022    2:02 PM 06/06/2022    2:50 PM  GAD 7 : Generalized Anxiety Score  Nervous, Anxious, on Edge 0 0 0 0  Control/stop worrying 0 0 0 0  Worry too much - different things 0 0 0 0  Trouble relaxing 0 0 0 0  Restless 0 0 0 0  Easily annoyed or irritable 0 0 0 0  Afraid - awful might happen 0 0 0 0  Total GAD 7 Score 0 0 0 0  Anxiety Difficulty Not difficult at all Not difficult at all Not difficult at all Not difficult at all       04/22/2023    3:47 PM 02/24/2023    8:02 AM 12/01/2022    2:01 PM  Depression screen PHQ 2/9  Decreased Interest 0 0 0  Down, Depressed, Hopeless 0 0 0  PHQ - 2 Score 0 0 0  Altered sleeping   0  Tired, decreased energy   0  Change in appetite   0  Feeling bad or failure about yourself    0  Trouble concentrating   0  Moving slowly or fidgety/restless   0  Suicidal thoughts   0  PHQ-9 Score   0  Difficult doing work/chores   Not difficult at all    BP Readings from Last 3 Encounters:  10/12/23 124/74  04/22/23 (!) 142/94  02/24/23 132/86    Wt Readings from Last 3 Encounters:  10/12/23 205 lb 6 oz (93.2 kg)  04/22/23 206 lb (93.4 kg)  02/24/23 215 lb (97.5 kg)    BP 124/74   Pulse 74   Temp 97.8 F (36.6 C) (Oral)   Ht 5' 9 (1.753 m)   Wt 205 lb 6 oz (93.2 kg)   SpO2 97%   BMI 30.33 kg/m   Physical  Exam Vitals and nursing note reviewed.  Constitutional:      Appearance: Normal appearance.  Neck:     Vascular: No carotid bruit.  Cardiovascular:     Rate and Rhythm: Normal rate and regular rhythm.     Heart sounds: No murmur heard.  No friction rub. No gallop.  Pulmonary:     Effort: Pulmonary effort is normal.     Breath sounds: Examination of the right-lower field reveals wheezing. Examination of the left-lower field reveals wheezing. Wheezing present. No rhonchi or rales.  Abdominal:     General: There is no distension.  Musculoskeletal:        General: Normal range of motion.  Skin:    General: Skin is warm and dry.  Neurological:     Mental Status: He is alert and oriented to person, place, and time.     Gait: Gait is intact.  Psychiatric:        Mood and Affect: Mood and affect normal.     Recent Labs     Component Value Date/Time   NA 141 02/24/2023 0844   K 3.9 02/24/2023 0844   CL 99 02/24/2023 0844   CO2 25 02/24/2023 0844   GLUCOSE 92 02/24/2023 0844   GLUCOSE 95 01/27/2022 1401   BUN 28 (H) 02/24/2023 0844   CREATININE 1.11 02/24/2023 0844   CALCIUM  10.6 (H) 02/24/2023 0844   PROT 6.9 02/24/2023 0844   ALBUMIN 4.6 02/24/2023 0844   AST 17 02/24/2023 0844   ALT 21 02/24/2023 0844   ALKPHOS 73 02/24/2023 0844   BILITOT 0.3 02/24/2023 0844   GFRNONAA >60 08/20/2015 1539   GFRAA >60 08/20/2015 1539    Lab Results  Component Value Date   WBC 6.3 02/24/2023   HGB 16.6 02/24/2023   HCT 48.8 02/24/2023   MCV 90 02/24/2023   PLT 216 02/24/2023   Lab Results  Component Value Date   HGBA1C 5.7 (H) 02/24/2023   HGBA1C 5.9 01/27/2022   HGBA1C 5.8 02/23/2020   Lab Results  Component Value Date   CHOL 245 (H) 02/24/2023   HDL 75 02/24/2023   LDLCALC 131 (H) 02/24/2023   LDLDIRECT 56.0 02/17/2018   TRIG 221 (H) 02/24/2023   CHOLHDL 3.3 02/24/2023   Lab Results  Component Value Date   TSH 2.230 02/24/2023      Assessment and Plan:  1.  Hyperlipidemia, unspecified hyperlipidemia type (Primary) Repeat fasting lipids.   I recommend limiting consumption of foods high in saturated fats. High-fiber foods such as fruits, vegetables, beans, whole grains, and nuts are encouraged. As always, regular physical activity is recommended for cholesterol metabolism.  - Lipid panel  2. Prediabetes Repeat A1c.  - Hemoglobin A1c  3. Immunization due Discussed Prevnar 20. Increased risk with COPD. Patient defers today but may return for nurse visit.     Return in about 4 months (around 02/25/2024) for CPE.    Rolan Hoyle, PA-C, DMSc, Nutritionist [facility] Primary Care and Sports Medicine [facility] [phone]

## 2023-10-17 LAB — LIPID PANEL
Chol/HDL Ratio: 3 ratio (ref 0.0–5.0)
Cholesterol, Total: 124 mg/dL (ref 100–199)
HDL: 42 mg/dL (ref >39)
LDL Chol Calc (NIH): 53 mg/dL (ref 0–99)
Triglycerides: 177 mg/dL — ABNORMAL HIGH (ref 0–149)
VLDL Cholesterol Cal: 29 mg/dL (ref 5–40)

## 2023-10-17 LAB — HEMOGLOBIN A1C
Est. average glucose Bld gHb Est-mCnc: 117 mg/dL
Hgb A1c MFr Bld: 5.7 % — ABNORMAL HIGH (ref 4.8–5.6)

## 2023-10-19 ENCOUNTER — Ambulatory Visit: Payer: Self-pay | Admitting: Physician Assistant

## 2023-10-19 ENCOUNTER — Other Ambulatory Visit: Payer: Self-pay | Admitting: Internal Medicine

## 2023-10-20 ENCOUNTER — Other Ambulatory Visit: Payer: Self-pay

## 2023-10-20 MED ORDER — UMECLIDINIUM-VILANTEROL 62.5-25 MCG/ACT IN AEPB
1.0000 | INHALATION_SPRAY | Freq: Every day | RESPIRATORY_TRACT | 11 refills | Status: AC
  Filled 2023-10-20: qty 60, 30d supply, fill #0
  Filled 2023-11-18: qty 60, 30d supply, fill #1
  Filled 2023-12-18: qty 60, 30d supply, fill #2
  Filled 2024-01-09 – 2024-01-11 (×2): qty 60, 30d supply, fill #3
  Filled 2024-02-09: qty 60, 30d supply, fill #4

## 2023-11-05 ENCOUNTER — Other Ambulatory Visit: Payer: Self-pay | Admitting: Physician Assistant

## 2023-11-06 ENCOUNTER — Other Ambulatory Visit: Payer: Self-pay

## 2023-11-06 ENCOUNTER — Other Ambulatory Visit: Payer: Self-pay | Admitting: Physician Assistant

## 2023-11-07 MED FILL — Omeprazole Cap Delayed Release 20 MG: ORAL | 90 days supply | Qty: 90 | Fill #0 | Status: CN

## 2023-11-07 NOTE — Telephone Encounter (Signed)
 Requested Prescriptions  Pending Prescriptions Disp Refills   omeprazole  (PRILOSEC) 20 MG capsule 90 capsule 1    Sig: Take 1 capsule (20 mg total) by mouth daily.     Gastroenterology: Proton Pump Inhibitors Passed - 11/07/2023  5:27 PM      Passed - Valid encounter within last 12 months    Recent Outpatient Visits           3 weeks ago Hyperlipidemia, unspecified hyperlipidemia type   [facility] at [facility], Toribio SQUIBB[place]   6 months ago COPD exacerbation [facility])   [facility] & [facility] Medicine at [facility], Toribio [place]QU[place]B, [place]   8 months ago Annual physical exam   [facility] Primary Care & Sports Medicine at [facility], Toribio SQUIBB, [place]

## 2023-11-08 ENCOUNTER — Other Ambulatory Visit: Payer: Self-pay

## 2023-11-09 ENCOUNTER — Other Ambulatory Visit: Payer: Self-pay

## 2023-12-18 ENCOUNTER — Other Ambulatory Visit: Payer: Self-pay

## 2023-12-18 MED FILL — Omeprazole Cap Delayed Release 20 MG: ORAL | 90 days supply | Qty: 90 | Fill #0 | Status: AC

## 2024-01-09 ENCOUNTER — Other Ambulatory Visit: Payer: Self-pay

## 2024-01-09 ENCOUNTER — Other Ambulatory Visit: Payer: Self-pay | Admitting: Physician Assistant

## 2024-01-09 DIAGNOSIS — E785 Hyperlipidemia, unspecified: Secondary | ICD-10-CM

## 2024-01-09 DIAGNOSIS — N529 Male erectile dysfunction, unspecified: Secondary | ICD-10-CM

## 2024-01-11 ENCOUNTER — Other Ambulatory Visit: Payer: Self-pay

## 2024-01-11 MED ORDER — ROSUVASTATIN CALCIUM 20 MG PO TABS
20.0000 mg | ORAL_TABLET | Freq: Every day | ORAL | 0 refills | Status: AC
  Filled 2024-01-11: qty 90, 90d supply, fill #0

## 2024-01-11 MED ORDER — SILDENAFIL CITRATE 100 MG PO TABS
50.0000 mg | ORAL_TABLET | Freq: Every day | ORAL | 0 refills | Status: DC | PRN
  Filled 2024-01-11: qty 90, 90d supply, fill #0

## 2024-01-11 NOTE — Telephone Encounter (Signed)
 Requested Prescriptions  Pending Prescriptions Disp Refills   sildenafil  (VIAGRA ) 100 MG tablet 90 tablet 0    Sig: Take 0.5-1 tablets (50-100 mg total) by mouth daily as needed for erectile dysfunction.     Urology: Erectile Dysfunction Agents Passed - 01/11/2024  2:04 PM      Passed - AST in normal range and within 360 days    AST  Date Value Ref Range Status  02/24/2023 17 0 - 40 IU/L Final         Passed - ALT in normal range and within 360 days    ALT  Date Value Ref Range Status  02/24/2023 21 0 - 44 IU/L Final         Passed - Last BP in normal range    BP Readings from Last 1 Encounters:  10/12/23 124/74         Passed - Valid encounter within last 12 months    Recent Outpatient Visits           3 months ago Hyperlipidemia, unspecified hyperlipidemia type   [facility] Care & [facility] Medicine at [facility], Toribio [place]QU[place]B, [place]   8 months ago COPD exacerbation [facility])   [facility] at [facility], Toribio SQUIBB, [place]   10 months ago Annual physical exam   [facility] Primary Care & Sports Medicine at [facility], Toribio SQUIBB, [place]               rosuvastatin  (CRESTOR ) 20 MG tablet 90 tablet 0    Sig: Take 1 tablet (20 mg total) by mouth daily.     Cardiovascular:  Antilipid - Statins 2 Failed - 01/11/2024  2:04 PM      Failed - Lipid Panel in normal range within the last 12 months    Cholesterol, Total  Date Value Ref Range Status  10/16/2023 124 100 - 199 mg/dL Final   LDL Chol Calc (NIH)  Date Value Ref Range Status  10/16/2023 53 0 - 99 mg/dL Final   Direct LDL  Date Value Ref Range Status  02/17/2018 56.0 mg/dL Final    Comment:    Optimal:  <100 mg/dLNear or Above Optimal:  100-129 mg/dLBorderline High:  130-159 mg/dLHigh:  160-189 mg/dLVery High:  >190 mg/dL   HDL  Date Value Ref Range Status  10/16/2023 42 >39 mg/dL Final   Triglycerides  Date Value Ref Range Status  10/16/2023 177 (H)  0 - 149 mg/dL Final         Passed - Cr in normal range and within 360 days    Creatinine, Ser  Date Value Ref Range Status  02/24/2023 1.11 0.76 - 1.27 mg/dL Final         Passed - Patient is not pregnant      Passed - Valid encounter within last 12 months    Recent Outpatient Visits           3 months ago Hyperlipidemia, unspecified hyperlipidemia type   [facility] Primary Care & Sports Medicine at [facility], Toribio [place]QU[place]B, [place]   8 months ago COPD exacerbation [facility])   [facility] at [facility], Toribio SQUIBB, [place]   10 months ago Annual physical exam   [facility] Primary Care & Sports Medicine at [facility], Toribio SQUIBB, [place]

## 2024-01-13 ENCOUNTER — Other Ambulatory Visit: Payer: Self-pay

## 2024-01-18 ENCOUNTER — Other Ambulatory Visit: Payer: Self-pay

## 2024-02-05 ENCOUNTER — Other Ambulatory Visit: Payer: Self-pay

## 2024-02-05 ENCOUNTER — Ambulatory Visit: Admitting: Physician Assistant

## 2024-02-05 ENCOUNTER — Encounter: Payer: Self-pay | Admitting: Physician Assistant

## 2024-02-05 VITALS — BP 144/94 | HR 75 | Temp 98.1°F | Ht 69.0 in | Wt 216.0 lb

## 2024-02-05 DIAGNOSIS — J441 Chronic obstructive pulmonary disease with (acute) exacerbation: Secondary | ICD-10-CM | POA: Diagnosis not present

## 2024-02-05 MED ORDER — PREDNISONE 20 MG PO TABS
20.0000 mg | ORAL_TABLET | Freq: Two times a day (BID) | ORAL | 0 refills | Status: AC
  Filled 2024-02-05: qty 10, 5d supply, fill #0

## 2024-02-05 MED ORDER — DOXYCYCLINE HYCLATE 100 MG PO TABS
100.0000 mg | ORAL_TABLET | Freq: Two times a day (BID) | ORAL | 0 refills | Status: AC
  Filled 2024-02-05: qty 14, 7d supply, fill #0

## 2024-02-05 MED ORDER — GUAIFENESIN-CODEINE 100-10 MG/5ML PO SOLN
5.0000 mL | Freq: Three times a day (TID) | ORAL | 0 refills | Status: AC | PRN
  Filled 2024-02-05: qty 120, 8d supply, fill #0

## 2024-02-05 NOTE — Progress Notes (Signed)
 "   Date:  02/05/2024   Name:  Jimmy Howell   DOB:  Aug 19, 1970   MRN:  [id number]   Chief Complaint: Cough  Cough This is a new problem. Episode onset: X2 weeks. The problem has been gradually improving. The problem occurs hourly. The cough is Productive of sputum (green). Associated symptoms include nasal congestion and wheezing. He has tried OTC cough suppressant (sudafed, mucinex , benadryl ) for the symptoms. The treatment provided no relief. His past medical history is significant for COPD.    Jimmy Howell presents today for evaluation of sinus congestion and persistent cough for the last 2 weeks.  Similar issue in April 2025 treated with combination of doxycycline , prednisone , and guaifenesin -codeine .  Known COPD, but quit smoking 9 years ago.  Scheduled for physical with me next month.  [place] 8 [place].  Medication list has been reviewed and updated.  Active Medications[1]   Review of Systems  Respiratory:  Positive for cough and wheezing.     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   Hyperlipidemia 04/19/2018   Obesity (BMI 30.0-34.9) 06/17/2017   Fatty liver 06/17/2017   Alcohol abuse 10/23/2015   Atherosclerosis of native coronary artery of native heart without angina pectoris 08/03/2015   Acid reflux 07/09/2015   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   Lung nodules 02/19/2015   COPD (chronic obstructive pulmonary disease) (HCC) 02/19/2015   Liver lesion 02/19/2015   Degeneration of intervertebral disc of cervical region 11/21/2013   Cervical spinal stenosis 11/21/2013     Allergies[2]  Immunization History  Administered Date(s) Administered   Hep A / Hep B 10/06/2017, 11/09/2017, 02/23/2020   Influenza,inj,Quad PF,6+ Mos 10/27/2017, 11/08/2018, 12/08/2019   Moderna Sars-Covid-2 Vaccination 09/02/2019, 10/02/2019    Past Surgical History:  Procedure Laterality Date   ESOPHAGOGASTRODUODENOSCOPY (EGD) WITH PROPOFOL  N/A 08/10/2020   Procedure: ESOPHAGOGASTRODUODENOSCOPY (EGD) WITH PROPOFOL ;  Surgeon: Therisa Bi, MD;  Location[facility] ENDOSCOPY;  Service: Gastroenterology;  Laterality: N/A;   FINGER SURGERY     HERNIA REPAIR     Inguinal Hernia Repair   VIDEO ASSISTED THORACOSCOPY (VATS)/THOROCOTOMY Right 08/20/2015   Procedure: PREOP BRONCH, THORACOTOMY, RIGHT UPPER LOBECTOMY;  Surgeon: Evalene Glasser, MD;  Location: [facility] ORS;  Service: General;  Laterality: Right;    Social History[3]  Family History  Problem Relation Age of Onset   Hypertension Mother    Arthritis Mother    COPD Mother    Stroke Mother    Hypertension Father    Bladder Cancer Father    Asthma Father    Cancer Paternal Aunt        stomach   Arthritis Other        Parent   Hyperlipidemia Other        Parent   Heart disease Other        Parent   Hypertension Other        Parent   Kidney disease Other        Parent        02/05/2024    4:01 PM 04/22/2023    3:47 PM 02/24/2023    8:02 AM 12/01/2022    2:02 PM  GAD 7 : Generalized Anxiety Score  Nervous, Anxious, on Edge 0 0  0  0   Control/stop worrying 0 0  0  0   Worry too much - different things 0 0  0  0   Trouble relaxing 0 0  0  0   Restless 0 0  0  0   Easily annoyed or irritable 0 0  0  0   Afraid - awful might happen 0 0  0  0   Total GAD 7 Score 0 0 0 0  Anxiety Difficulty Not difficult at all Not difficult at all Not difficult at all Not difficult at all     Data saved with a previous flowsheet row definition       02/05/2024    4:01 PM 04/22/2023    3:47 PM 02/24/2023    8:02 AM  Depression  screen PHQ 2/9  Decreased Interest 0 0 0  Down, Depressed, Hopeless 0 0 0  PHQ - 2 Score 0 0 0    BP Readings from Last 3 Encounters:  02/05/24 (!) 144/94  10/12/23 124/74  04/22/23 (!) 142/94    Wt Readings from Last 3 Encounters:  02/05/24 216 lb (98 kg)  10/12/23 205 lb 6 oz (93.2 kg)  04/22/23 206 lb (93.4 kg)    BP (!) 144/94   Pulse 75   Temp 98.1 F (36.7 C)   Ht 5' 9 (1.753 m)   Wt 216 lb (98 kg)   SpO2 96%   BMI 31.90 kg/m   Physical Exam Vitals and nursing note reviewed.  Constitutional:      General: He is not in acute distress.    Appearance: Normal appearance.  HENT:     Right Ear: Tympanic membrane normal.     Left Ear: Tympanic membrane normal.     Ears:     Comments: EAC clear bilaterally with good view of TM which is without effusion or erythema.     Nose:     Right Sinus: Frontal sinus tenderness present. No maxillary sinus tenderness.     Left Sinus: Frontal sinus tenderness present. No maxillary sinus tenderness.     Mouth/Throat:     Mouth: Mucous membranes are moist.     Pharynx: No oropharyngeal exudate or posterior oropharyngeal erythema.  Eyes:     Conjunctiva/sclera: Conjunctivae normal.     Pupils: Pupils are equal, round, and reactive to light.  Cardiovascular:     Rate and Rhythm: Normal rate and regular rhythm.     Heart sounds: No murmur heard.    No friction rub. No gallop.  Pulmonary:     Effort: Pulmonary effort is normal.     Breath sounds: Examination of the right-lower field reveals rhonchi. Examination of the left-lower field reveals wheezing and rhonchi. Wheezing and rhonchi present. No rales.  Lymphadenopathy:     Cervical: No cervical adenopathy.     Recent Labs     Component Value Date/Time   NA 141 02/24/2023 0844   K 3.9 02/24/2023 0844   CL 99 02/24/2023 0844   CO2 25 02/24/2023 0844   GLUCOSE 92 02/24/2023 0844   GLUCOSE 95 01/27/2022 1401   BUN 28 (H) 02/24/2023 0844   CREATININE 1.11 02/24/2023 0844    CALCIUM  10.6 (H) 02/24/2023 0844   PROT 6.9 02/24/2023 0844   ALBUMIN 4.6 02/24/2023 0844   AST 17 02/24/2023 0844   ALT 21 02/24/2023 0844   ALKPHOS 73 02/24/2023 0844  BILITOT 0.3 02/24/2023 0844   GFRNONAA >60 08/20/2015 1539   GFRAA >60 08/20/2015 1539    Lab Results  Component Value Date   WBC 6.3 02/24/2023   HGB 16.6 02/24/2023   HCT 48.8 02/24/2023   MCV 90 02/24/2023   PLT 216 02/24/2023   Lab Results  Component Value Date   HGBA1C 5.7 (H) 10/16/2023   HGBA1C 5.7 (H) 02/24/2023   HGBA1C 5.9 01/27/2022   Lab Results  Component Value Date   CHOL 124 10/16/2023   HDL 42 10/16/2023   LDLCALC 53 10/16/2023   LDLDIRECT 56.0 02/17/2018   TRIG 177 (H) 10/16/2023   CHOLHDL 3.0 10/16/2023   Lab Results  Component Value Date   TSH 2.230 02/24/2023        Assessment & Plan COPD exacerbation (HCC) Will treat the same as last year, detailed below.  Plan for Prevnar 20 immunization next visit, patient in agreement Orders:   doxycycline  (VIBRA -TABS) 100 MG tablet; Take 1 tablet (100 mg total) by mouth 2 (two) times daily for 7 days. Do not take with dairy. This medication INCREASES SUN SENSITIVITY so avoid direct sunlight.   predniSONE  (DELTASONE ) 20 MG tablet; Take 1 tablet (20 mg total) by mouth 2 (two) times daily with a meal for 5 days.   guaiFENesin -codeine  100-10 MG/5ML syrup; Take 5 mLs by mouth 3 (three) times daily as needed.    Return in 3 weeks as scheduled for routine physical exam with labs and Prevnar 20   Rolan Hoyle, PA-C, DMSc, DipACLM, Nutritionist [place] Primary Care and Sports Medicine [facility] [phone]      [1]  Current Meds  Medication Sig   albuterol  (VENTOLIN  HFA) 108 (90 Base) MCG/ACT inhaler Inhale 2 puffs into the lungs every 4 (four) hours as needed for wheezing or shortness of breath.   methocarbamol  (ROBAXIN ) 500 MG tablet Take 1 tablet (500 mg total) by mouth every 8 (eight)  hours as needed for muscle spasms.   olopatadine  (PATADAY ) 0.1 % ophthalmic solution Place 1 drop into both eyes 2 (two) times daily.   omeprazole  (PRILOSEC) 20 MG capsule Take 1 capsule (20 mg total) by mouth daily.   rosuvastatin  (CRESTOR ) 20 MG tablet Take 1 tablet (20 mg total) by mouth daily.   sildenafil  (VIAGRA ) 100 MG tablet Take 0.5-1 tablets (50-100 mg total) by mouth daily as needed for erectile dysfunction.   umeclidinium-vilanterol (ANORO ELLIPTA ) 62.5-25 MCG/ACT AEPB Inhale 1 puff into the lungs daily.   Current Facility-Administered Medications for the 02/05/24 encounter (Office Visit) with Hoyle Toribio SQUIBB, PA  Medication   ipratropium-albuterol  (DUONEB) 0.5-2.5 (3) MG/3ML nebulizer solution 3 mL  [2] No Known Allergies [3]  Social History Tobacco Use   Smoking status: Former    Current packs/day: 0.00    Average packs/day: 2.0 packs/day for 30.0 years (60.0 ttl pk-yrs)    Types: Cigarettes    Start date: 07/06/1985    Quit date: 08/23/2015    Years since quitting: 8.4   Smokeless tobacco: Never  Vaping Use   Vaping status: Never Used  Substance Use Topics   Alcohol use: Yes    Alcohol/week: 12.0 standard drinks of alcohol    Types: 12 Cans of beer per week   Drug use: Never   "

## 2024-02-08 ENCOUNTER — Other Ambulatory Visit: Payer: Self-pay | Admitting: Physician Assistant

## 2024-02-08 DIAGNOSIS — N529 Male erectile dysfunction, unspecified: Secondary | ICD-10-CM

## 2024-02-09 ENCOUNTER — Other Ambulatory Visit: Payer: Self-pay

## 2024-02-09 MED ORDER — SILDENAFIL CITRATE 100 MG PO TABS
50.0000 mg | ORAL_TABLET | Freq: Every day | ORAL | 0 refills | Status: AC | PRN
  Filled 2024-02-09: qty 90, 90d supply, fill #0

## 2024-02-09 NOTE — Telephone Encounter (Signed)
 Requested Prescriptions  Pending Prescriptions Disp Refills   sildenafil  (VIAGRA ) 100 MG tablet 90 tablet 0    Sig: Take 0.5-1 tablets (50-100 mg total) by mouth daily as needed for erectile dysfunction.     Urology: Erectile Dysfunction Agents Failed - 02/09/2024 12:23 PM      Failed - Last BP in normal range    BP Readings from Last 1 Encounters:  02/05/24 (!) 144/94         Passed - AST in normal range and within 360 days    AST  Date Value Ref Range Status  02/24/2023 17 0 - 40 IU/L Final         Passed - ALT in normal range and within 360 days    ALT  Date Value Ref Range Status  02/24/2023 21 0 - 44 IU/L Final         Passed - Valid encounter within last 12 months    Recent Outpatient Visits           4 days ago COPD exacerbation [facility])   [place] Primary Care & Sports Medicine at [facility], Toribio [place]QU[place], [place]   4 months ago Hyperlipidemia, unspecified hyperlipidemia type   [facility] Primary Care & Sports Medicine at [facility], Toribio [place]QU[place]   9 months ago COPD exacerbation [facility])   [facility] Primary Care & Sports Medicine at [facility], Toribio SQUIBB, [place]   11 months ago Annual physical exam   [facility] Primary Care & Sports Medicine at [facility], Toribio SQUIBB, [place]

## 2024-02-26 ENCOUNTER — Encounter: Admitting: Physician Assistant

## 2024-03-24 ENCOUNTER — Ambulatory Visit: Admitting: Internal Medicine
